# Patient Record
Sex: Female | Born: 1955 | Race: Black or African American | Hispanic: No | Marital: Married | Smoking: Never smoker
Health system: Southern US, Community
[De-identification: ages and names within clinical notes are randomized; demographics above are authoritative.]

## PROBLEM LIST (undated history)

## (undated) DIAGNOSIS — E785 Hyperlipidemia, unspecified: Secondary | ICD-10-CM

## (undated) DIAGNOSIS — Z803 Family history of malignant neoplasm of breast: Secondary | ICD-10-CM

## (undated) DIAGNOSIS — N950 Postmenopausal bleeding: Secondary | ICD-10-CM

## (undated) DIAGNOSIS — E119 Type 2 diabetes mellitus without complications: Secondary | ICD-10-CM

## (undated) DIAGNOSIS — C801 Malignant (primary) neoplasm, unspecified: Secondary | ICD-10-CM

## (undated) HISTORY — DX: Type 2 diabetes mellitus without complications: E11.9

## (undated) HISTORY — DX: Family history of malignant neoplasm of breast: Z80.3

## (undated) HISTORY — DX: Postmenopausal bleeding: N95.0

## (undated) HISTORY — PX: UTERINE FIBROID SURGERY: SHX826

---

## 2000-02-15 ENCOUNTER — Other Ambulatory Visit: Admission: RE | Admit: 2000-02-15 | Discharge: 2000-02-15 | Payer: Self-pay | Admitting: Internal Medicine

## 2009-06-10 ENCOUNTER — Encounter: Admission: RE | Admit: 2009-06-10 | Discharge: 2009-06-10 | Payer: Self-pay | Admitting: Family Medicine

## 2012-08-27 ENCOUNTER — Ambulatory Visit: Payer: Self-pay | Admitting: Emergency Medicine

## 2012-08-27 VITALS — BP 140/70 | HR 92 | Temp 98.4°F | Resp 16

## 2012-08-27 DIAGNOSIS — N950 Postmenopausal bleeding: Secondary | ICD-10-CM

## 2012-08-27 DIAGNOSIS — R300 Dysuria: Secondary | ICD-10-CM

## 2012-08-27 DIAGNOSIS — N3000 Acute cystitis without hematuria: Secondary | ICD-10-CM

## 2012-08-27 LAB — POCT URINALYSIS DIPSTICK
Bilirubin, UA: NEGATIVE
Glucose, UA: NEGATIVE
Ketones, UA: NEGATIVE
Spec Grav, UA: 1.02
Urobilinogen, UA: 0.2

## 2012-08-27 LAB — POCT WET PREP WITH KOH
Trichomonas, UA: NEGATIVE
Yeast Wet Prep HPF POC: NEGATIVE

## 2012-08-27 LAB — POCT UA - MICROSCOPIC ONLY: Mucus, UA: NEGATIVE

## 2012-08-27 MED ORDER — SULFAMETHOXAZOLE-TRIMETHOPRIM 800-160 MG PO TABS: 1.0000 | ORAL_TABLET | Freq: Two times a day (BID) | ORAL | Status: AC

## 2012-08-27 MED ORDER — PHENAZOPYRIDINE HCL 200 MG PO TABS: 200.0000 mg | ORAL_TABLET | Freq: Three times a day (TID) | ORAL | Status: AC | PRN

## 2012-08-27 NOTE — Progress Notes (Signed)
   Date:  08/27/2012   Name:  Makayla Buchanan   DOB:  [birth year]   MRN:  [id number] Gender: female Age: 56 y.o.  PCP:  No primary provider on file.    Chief Complaint: Urinary Tract Infection and Vaginitis   History of Present Illness:  Makayla Buchanan is a 56 y.o. pleasant patient who presents with the following:  Has painless post menopausal bleeding for the past several months.  Has no GYN care for "a long time"  At least several years.  Treating herself for a yeast infection with OTC cream without improvement; still has dysuria and itching.  No fever or chills.  No dyspareunia.  Frequency and urgency but no back or abdominal pain.  No GI symptoms.    There is no problem list on file for this patient.   No past medical history on file.  No past surgical history on file.  History  Substance Use Topics  . Smoking status: Never Smoker   . Smokeless tobacco: Not on file  . Alcohol Use: Not on file    No family history on file.  No Known Allergies  Medication list has been reviewed and updated.  Current Outpatient Prescriptions on File Prior to Visit  Medication Sig Dispense Refill  . insulin NPH-insulin regular (NOVOLIN 70/30) (70-30) 100 UNIT/ML injection Inject into the skin 1 day or 1 dose.        Review of Systems:  As per HPI, otherwise negative.    Physical Examination: Filed Vitals:   08/27/12 1035  BP: 140/70  Pulse: 92  Temp: 98.4 F (36.9 C)  Resp: 16   There were no vitals filed for this visit. There is no height or weight on file to calculate BMI. Ideal Body Weight:     GEN: WDWN, NAD, Non-toxic, Alert & Oriented x 3 HEENT: Atraumatic, Normocephalic.  Ears and Nose: No external deformity. EXTR: No clubbing/cyanosis/edema NEURO: Normal gait.  PSYCH: Normally interactive. Conversant. Not depressed or anxious appearing.  Calm demeanor.  Physical Examination: Pelvic - normal external genitalia, vulva, vagina, cervix, uterus and adnexa, Assessment and  Plan: PAP Painless postmenopausal bleeding No evidence for yeast infection GYN referal Cystitis Septra and pyridium Carmelina Dane, MD  Results for orders placed in visit on 08/27/12  POCT UA - MICROSCOPIC ONLY      Component Value Range   WBC, Ur, HPF, POC TNTC     RBC, urine, microscopic 2-4     Bacteria, U Microscopic 1+     Mucus, UA neg     Epithelial cells, urine per micros 2-4     Crystals, Ur, HPF, POC neg     Casts, Ur, LPF, POC neg     Yeast, UA neg    POCT URINALYSIS DIPSTICK      Component Value Range   Color, UA yellow     Clarity, UA cloudy     Glucose, UA neg     Bilirubin, UA neg     Ketones, UA neg     Spec Grav, UA 1.020     Blood, UA trace     pH, UA 5.5     Protein, UA 30     Urobilinogen, UA 0.2     Nitrite, UA neg     Leukocytes, UA large (3+)

## 2012-08-28 LAB — PAP IG (IMAGE GUIDED)

## 2013-02-21 ENCOUNTER — Other Ambulatory Visit: Payer: Self-pay | Admitting: Obstetrics & Gynecology

## 2013-02-21 DIAGNOSIS — C541 Malignant neoplasm of endometrium: Secondary | ICD-10-CM

## 2013-02-26 ENCOUNTER — Ambulatory Visit
Admission: RE | Admit: 2013-02-26 | Discharge: 2013-02-26 | Disposition: A | Discharge status: Home / Self Care | Payer: BC Managed Care – PPO | Source: Ambulatory Visit | Attending: Obstetrics & Gynecology | Admitting: Obstetrics & Gynecology

## 2013-02-26 DIAGNOSIS — C541 Malignant neoplasm of endometrium: Secondary | ICD-10-CM

## 2013-02-26 MED ORDER — IOHEXOL 300 MG/ML  SOLN
100.0000 mL | Freq: Once | INTRAMUSCULAR | Status: AC | PRN
  Administered 2013-02-26: 100 mL via INTRAVENOUS

## 2013-03-06 ENCOUNTER — Encounter: Payer: Self-pay | Admitting: Gynecologic Oncology

## 2013-03-07 ENCOUNTER — Other Ambulatory Visit: Payer: BC Managed Care – PPO | Admitting: Lab

## 2013-03-07 ENCOUNTER — Ambulatory Visit: Payer: BC Managed Care – PPO | Attending: Gynecologic Oncology | Admitting: Gynecologic Oncology

## 2013-03-07 ENCOUNTER — Encounter: Payer: Self-pay | Admitting: Gynecologic Oncology

## 2013-03-07 VITALS — BP 128/58 | HR 80 | Temp 98.3°F | Ht 64.0 in | Wt 166.5 lb

## 2013-03-07 DIAGNOSIS — E119 Type 2 diabetes mellitus without complications: Secondary | ICD-10-CM

## 2013-03-07 DIAGNOSIS — C541 Malignant neoplasm of endometrium: Secondary | ICD-10-CM

## 2013-03-07 DIAGNOSIS — C549 Malignant neoplasm of corpus uteri, unspecified: Secondary | ICD-10-CM | POA: Insufficient documentation

## 2013-03-07 LAB — BASIC METABOLIC PANEL (CC13)
Chloride: 109 mEq/L — ABNORMAL HIGH (ref 98–107)
Potassium: 3.5 mEq/L (ref 3.5–5.1)

## 2013-03-07 LAB — CBC WITH DIFFERENTIAL/PLATELET
Basophils Absolute: 0 10*3/uL (ref 0.0–0.1)
Eosinophils Absolute: 0 10*3/uL (ref 0.0–0.5)
HGB: 12.7 g/dL (ref 11.6–15.9)
LYMPH%: 33.5 % (ref 14.0–49.7)
MONO#: 0.5 10*3/uL (ref 0.1–0.9)
NEUT#: 3.2 10*3/uL (ref 1.5–6.5)
Platelets: 184 10*3/uL (ref 145–400)
RBC: 4.24 10*6/uL (ref 3.70–5.45)
RDW: 13.7 % (ref 11.2–14.5)
WBC: 5.6 10*3/uL (ref 3.9–10.3)

## 2013-03-07 LAB — HEMOGLOBIN A1C
Hgb A1c MFr Bld: 10.9 % — ABNORMAL HIGH (ref <5.7)
Mean Plasma Glucose: 266 mg/dL — ABNORMAL HIGH (ref <117)

## 2013-03-07 NOTE — Patient Instructions (Signed)
Cancer of the Uterus The uterus is part of a woman's reproductive system. It is the hollow, pear-shaped organ where a baby grows. The uterus is in the pelvis between the bladder and the rectum. The narrow, lower portion of the uterus is the cervix. The fallopian tubes extend from either side of the top of the uterus to the ovaries. The wall of the uterus has two layers of tissue. The inner layer, or lining, is the endometrium. The outer layer is muscle tissue called the myometrium. In women of childbearing age, the lining of the uterus grows and thickens each month to prepare for pregnancy. If a woman does not become pregnant, the thick, bloody lining flows out of the body through the vagina. This flow is called menstruation. TYPES OF UTERINE CANCER  The most common type of cancer of the uterus begins in the lining (endometrium). It is called endometrial cancer, uterine cancer, or cancer of the uterus. It is seen in 2% to 3% of women.  A different type of cancer, uterine sarcoma, develops in the muscle (myometrium). Cancer that begins in the cervix is also a different type of cancer.  Rarely, a noncancerous fibroid tumor of the uterus develops into a sarcoma. CAUSES  No one knows the exact causes of uterine cancer. But it is clear that this disease is not contagious. No one can "catch" cancer from another person. Women who get this disease are more likely than other women to have certain risk factors. A risk factor is something that increases a person's chance of developing the disease.  Most women who have known risk factors do not get uterine cancer. On the other hand, many who do get this disease have none of these factors. Doctors can seldom explain why one woman gets uterine cancer and another does not.  Studies have found the following risk factors:  Age. Cancer of the uterus occurs mostly in women over age 50.  Endometrial hyperplasia (enlarged endometrium). The risk of uterine cancer is  higher if a woman has endometrial hyperplasia.  Hormone replacement therapy (HRT). HRT is used to control the symptoms of menopause, to prevent osteoporosis (thinning of the bones), and to reduce the risk of heart disease or stroke. Women who still have their uterus, and use estrogen without progesterone, have an increased risk of uterine cancer. Long-term use and large doses of estrogen seem to increase this risk. Women who use a combination of estrogen and progesterone have a lower risk of uterine cancer than women who use estrogen alone. The progesterone protects the uterus from developing cancer.  Obesity and related conditions. The body stores and releases some of its estrogen in fatty tissue. That is why obese women are more likely than thin women to have higher levels of estrogen in their bodies. High levels of estrogen may be the reason that obese women have an increased risk of developing uterine cancer. The risk of this disease is also higher in women with diabetes or high blood pressure. These conditions occur in many obese women.  Tamoxifen. Women taking the drug tamoxifen to prevent or treat breast cancer have an increased risk of uterine cancer. This risk appears to be related to the estrogen-like effect of this drug on the uterus.  Race. White women are more likely than African-American women to get uterine cancer.  Colorectal cancer. Women who have had an inherited form of colorectal cancer have a higher risk of developing uterine cancer than other women.  Infertility.  Beginning menstrual   periods before age 12.  Having menstrual periods after age 52.  History of cancer of the ovary or intestine.  Family history of uterine cancer.  Having diabetes, high blood pressure, thyroid or gallbladder disease.  Long-term use of high does of birth control pills. Birth control pills today are low in hormone doses.  Radiation to the abdomen or pelvis.  Smoking. SYMPTOMS  Uterine  cancer usually occurs after menopause. But it may also occur around the time that menopause begins. Abnormal vaginal bleeding is the most common symptom of uterine cancer. Bleeding may start as a watery, blood-streaked flow that gradually contains more blood. Women should not assume that abnormal vaginal bleeding is part of menopause. A woman should see her caregiver if she has any of the following symptoms:  Unusual vaginal bleeding or discharge.  Difficult or painful urination.  Pain during intercourse.  Pain in the pelvic area.  Increased girth (growth) of the stomach.  Any vaginal bleeding after menopause.  Unexplained weight loss. These symptoms can be caused by cancer or other less serious conditions. Most often they are not cancer. But a thorough evaluation is needed to be certain. DIAGNOSIS  If a woman has symptoms that suggest uterine cancer, her caregiver may check her general health and may order blood and urine tests. The caregiver also may perform one or more of these exams or tests.  Blood and urine tests and chest x-rays. The woman also may have:  Other X-rays.  CT scans.  Ultrasound test.  Magnetic resonance imaging (MRI).  Sigmoidoscopy.  Colonoscopy.  Pelvic exam. A woman will have a pelvic exam to check the vagina, uterus, bladder, and rectum. The caregiver feels these organs for any lumps or changes in their shape or size. To see the upper part of the vagina and the cervix, the caregiver inserts an instrument called a speculum into the vagina.  Pap test. The caregiver collects cells from the cervix and upper vagina. A medical laboratory checks for abnormal cells. The Pap test is better for detecting cancer of the cervix. But cells from inside the uterus usually do not show up on a Pap test. It is not a reliable test for uterine cancer.  Transvaginal ultrasound. The medical caregiver inserts an instrument into the vagina. The instrument aims high-frequency  sound waves at the uterus. The pattern of the echoes they produce creates a picture. If the endometrium looks too thick, the caregiver can do a biopsy.  Biopsy. The medical caregiver removes a sample of tissue from the uterine lining. This usually can be done in the caregiver's office.  Dilatation and Curettage (D&C). In some cases, a woman may need to have a D&C. D&C is usually done as same-day surgery with anesthesia in a hospital. A pathologist examines the tissue (lining of the uterus) to check for cancer cells and other conditions. STAGING   If uterine cancer is diagnosed, the caregiver needs to know the stage, or extent, of the disease to plan the best treatment. Staging is a careful attempt to find out whether the cancer has spread, and if so, to what parts of the body.  When uterine cancer spreads (metastasizes) outside the uterus, cancer cells are often found in nearby lymph nodes, nerves, or blood vessels. If the cancer has reached the lymph nodes, cancer cells may have spread to other lymph nodes and other organs of the body.  Staging is done at the time of surgery. In most cases, the most reliable way to stage   this disease is to remove the uterus, cervix, tubes, ovaries, and lymph nodes. A pathologist uses a microscope to examine the uterus and other tissues removed by the surgeon, to determine the extent of the cancer in the pelvis.  If lymph nodes have cancer cells, other parts of the body are examined, to see if it has spread to other organs. MAIN FEATURES OF EACH STAGE OF THE DISEASE: Stage I. The cancer is only in the body of the uterus. It is not in the cervix. Stage II. The cancer has spread from the body of the uterus to the cervix. Stage III. The cancer has spread outside the uterus, but not outside the pelvis (and not to the bladder or rectum). Lymph nodes in the pelvis may contain cancer cells. Stage IV. The cancer has spread into the bladder or rectum. It may have spread  beyond the pelvis to other body parts. TREATMENT  Women with uterine cancer have many treatment options. Most women with uterine cancer are treated with surgery. Some have radiation or chemotherapy. A smaller number of women may be treated with hormonal therapy. Some patients receive a combination of therapies. You may want to consult with another cancer doctor for a second opinion. The caregiver (usually a cancer doctor) is the best person to describe your treatment choices and to discuss the expected results of treatment. SURGERY  Most women with uterine cancer have surgery to remove the uterus, cervix, tubes, and ovaries (total hysterectomy). This is usually done through an incision in the abdomen.  The doctor may also remove the lymph nodes near the tumor, to see if they contain cancer. If cancer cells have reached the lymph nodes, it may mean that the disease has spread to other parts of the body. If cancer cells have not spread beyond the endometrium, the woman may not need to have any other treatment. The length of the hospital stay may vary from several days to a week. RADIATION THERAPY  In radiation therapy, high-energy rays are used to kill cancer cells. Like surgery, radiation therapy is a local therapy. It affects cancer cells only in the treated area.  Some women with Stage I, II, or III uterine cancer need both radiation therapy and surgery. They may have radiation before surgery to shrink the tumor, or after surgery to destroy any cancer cells that remain in the area. The doctor may suggest radiation treatments for the small number of women who cannot have surgery.  Doctors use two types of radiation therapy to treat uterine cancer:  External radiation. In external radiation therapy, a large machine outside the body is used to aim radiation at the tumor area. The woman usually does not stay overnight (outpatient) at the hospital or clinic, and receives external radiation 5 days a week  for several weeks. This schedule helps protect healthy cells and tissue by spreading out the total dose of radiation. No radioactive materials are put into the body for external radiation therapy.  Internal radiation. In internal radiation therapy, tiny tubes containing a radioactive substance are inserted through the vagina and cervix, into the uterus, and left in place for a few days. The woman stays in the hospital during this treatment. To protect others from radiation exposure, the patient may not be able to have visitors or may have visitors only for a short period of time while the implant is in place. Once the implant is removed, the woman has no radioactivity in her body.  Some patients need both   external and internal radiation therapies. CHEMOTHERAPY Chemotherapy is not usually used for endometrial cancer of the uterus. However, with sarcoma of the uterus or of the fibroid, it may be used in combination with surgery. Chemotherapy may also be used with recurring sarcoma, and in patients who cannot have surgery. HORMONE THERAPY Hormonal therapy involves substances that prevent cancer cells from multiplying or growing by attaching to hormone receptors. This causes changes in cancer cells. Before therapy begins, the caregiver may request a hormone receptor test. This special lab test of uterine tissue helps the caregiver learn if estrogen and progesterone receptors are present. If the tissue has receptors, the woman is more likely to respond to hormonal therapy.  Hormonal therapy is called a systemic therapy, because it can affect cancer cells throughout the body. Usually, hormonal therapy is a type of progesterone, taken as a pill or injection.  The doctor may use hormonal therapy for women with uterine cancer who are unable to have surgery or radiation therapy. Also, the doctor may give hormonal therapy to women with uterine cancer that has spread to the lungs or other distant sites. It is also  given to women with uterine cancer that has come back.  Hormonal therapy can cause a number of side effects. Women taking progesterone may retain fluid, have an increased appetite, and gain weight. Women who are still menstruating may have changes in their periods.  Hormone therapy can be used in combination with surgery or radiation. HOME CARE INSTRUCTIONS   Maintain a normal weight with a healthy balanced diet and exercise.  If you have diabetes, high blood pressure, thyroid or gallbladder disease, keep them in control with your caregiver's treatment and recommendations.  Do not smoke.  Do not take estrogen without taking progesterone with it, for menopausal symptoms.  Join a support group or get counseling, if you would like help dealing with your cancer.  If you are on hormone replacement therapy, see your caregiver as recommended, and be informed about the side effects of HRT.  Women with known risk factors should ask their caregiver what symptoms to look for and how often they should have an examination.  Keep your follow-up appointments and take your medicines as advised.  Write your questions down, and take them with you to your caregiver's appointments.  You may want another person to be with you for your appointments, so you do not miss any instructions. SEEK MEDICAL CARE IF:   You have any abnormal vaginal bleeding.  You are having menstrual periods at the age of 52 or older.  You have bleeding after sexual intercourse.  You are taking tomoxifen and develop vaginal bleeding.  Your stomach is growing, and you are not pregnant.  You have pain with sexual intercourse.  You have stomach or pelvis pain.  You have weight loss for no known reason.  You have pain or difficulty with urination. [facility]  Cancer Information Service (CIS) provides accurate, up-to-date information on cancer to patients and their families, health professionals, and  the general public:  Phone: 1-800-4-CANCER (1-800-422-6237).  Internet: http://www.cancer.gov NCI's website contains complete information about cancer causes and prevention, screening and diagnosis, treatment and survivorship, clinical trials, statistics, funding, training, and employment opportunities, and the Institute and its programs. CLINICAL TRIALS A woman who is interested in being part of a clinical trial should talk with her caregiver. NCI's website (http://www.cancer.gov) provides general information about clinical trials. It also offers detailed information about specific ongoing studies of uterine   cancer by linking to PDQ, a cancer information database developed by the NCI. The Cancer Information Service at 1-800-4-CANCER can answer questions about cancer and provide information from the PDQ database. Document Released: 12/05/2005 Document Revised: 02/27/2012 Document Reviewed: 10/08/2009 Exit[employer] Patient Information 2013 [employer].  

## 2013-03-07 NOTE — Progress Notes (Signed)
Consult Note: Gyn-Onc  Makayla Buchanan 57 y.o. female  CC:  Chief Complaint  Patient presents with  . endometrial cancer    HPI: Patient is seen today in consultation at the request of Dr. Seymour Bars for a newly diagnosed endometrial cancer.  Patient is a 57 year old gravida 1 para 1 who began having some bleeding about 6 months ago. The bleeding has been like spotting with no significant cramping or pain. She does a history of having irregular menstrual cycles. She's been menopausal for essentially 5 years. She never took any hormone replacement therapy. Due to the persistent nature for spotting she went to urgent care and his was referred to Dr. Seymour Bars.    Dr. Seymour Bars performed an endometrial biopsy on February 26. 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 She also had a CT scan of the abdomen and pelvis the findings which are below.  Findings: The lung bases are clear. The liver enhances with no focal abnormality and no ductal dilatation is seen. No calcified gallstones are noted. The pancreas is normal in size although somewhat atrophic. The pancreatic duct is not dilated. The adrenal glands and spleen are unremarkable. The stomach is moderately fluid distended with no abnormality noted. The kidneys enhance with no calculus or mass and no hydronephrosis is seen. On delayed images, the pelvocaliceal systems are unremarkable. The proximal ureters are normal in caliber. The abdominal aorta appears normal. No adenopathy is noted. Small mesenteric nodes are present none of which are pathologically enlarged. There is low attenuation within the uterus in this patient with recently diagnosed endometrial carcinoma. No pelvic adenopathy is seen. The ovaries are within normal limits in size. No free fluid is seen within the pelvis. The urinary bladder is moderately distended with no abnormality noted. No abnormality of the colon is seen. The terminal ileum is unremarkable. The appendix is visualized  filling with air and extending cephalad to the left of midline, with no inflammatory process noted. There is a 4 mm anterolisthesis of L4 on L5 which appears to be due to degenerative change involving the facet joints.  IMPRESSION:  1. Abnormal appearance of the uterus in this patient with recently diagnosed endometrial carcinoma. No evidence of metastatic involvement of the abdomen or pelvis and no adenopathy is seen.  2. 4 mm anterolisthesis of L4 on L5 which appears to be due to degenerative change   Review of Systems She does not have a primary care physician. She is a diabetic intake 50 units of insulin a day. She does not check her sugars she does not know when her last A1c was. She believes her last visit with her primary care physician was 4 years ago. She stay she's not require prescription again her insulin in due to lack of insurance did not see any medical care. Her last mammogram was 3 years ago. She's never had a colonoscopy. She states that she occasionally has some cramping related to her bowels. She hasn't cramping today and feel that it was due to sedation by the last night. She denies a change in her bowel or bladder habits. She denies any prior blood per rectum. She denies any change in her stool caliber. Her weight has been stable. History is quite tired. She works as a Technical brewer. She denies any neuropathy related to her diabetes.  She currently has medical insurance from the affordable care act.  Current Meds:  Outpatient Encounter Prescriptions as of 03/07/2013  Medication Sig Dispense Refill  . insulin NPH-insulin regular (  NOVOLIN 70/30) (70-30) 100 UNIT/ML injection Inject into the skin 1 day or 1 dose.       No facility-administered encounter medications on file as of 03/07/2013.    Allergy: No Known Allergies  Social Hx:   History   Social History  . Marital Status: Single    Spouse Name: N/A    Number of Children: N/A  . Years of Education: N/A    Occupational History  . Not on file.   Social History Main Topics  . Smoking status: Never Smoker   . Smokeless tobacco: Not on file  . Alcohol Use: No  . Drug Use: No  . Sexually Active: Not on file   Other Topics Concern  . Not on file   Social History Narrative  . No narrative on file    Past Surgical Hx: History reviewed. No pertinent past surgical history.  Past Medical Hx:  Past Medical History  Diagnosis Date  . Postmenopausal bleeding   . Diabetes mellitus without complication     Family Hx:  Family History  Problem Relation Age of Onset  . Diabetes Mother   . Hypertension Mother   . Breast cancer Mother   . Diabetes Father     Vitals:  Blood pressure 128/58, pulse 80, temperature 98.3 F (36.8 C), temperature source Oral, height 5\' 4"  (1.626 m), weight 166 lb 8 oz (75.524 kg).  Physical Exam:  Well-nourished well-developed female in no acute distress.  Neck: Supple, no lymphadenopathy no thyromegaly.  Lungs: Clear to auscultation bilaterally.  Cardiac Astro: Regular rate rhythm.  Abdomen: Soft, nontender, nondistended. Mildly obese. There is no palpable masses or hepatosplenomegaly.  Groins: No lymphadenopathy.  Extremities: No edema.  Pelvic: Normal external female genitalia. Vagina is atrophic. The cervix is multiparous. There are no lesions. There is no discharge. Bimanual examination reveals no masses or nodularity. The corpus is not enlarged.  Assessment/Plan: 56 year old with a clinical stage I grade 1 endometrial carcinoma that CT scan also that did not reveal any evidence of metastatic disease. I do believe that she would be a candidate for a minimally invasive surgery. We discussed proceeding with a total robotic hysterectomy with bilateral salpingo-oophorectomy. She understands that the uterus we sent for frozen section. We will start the pelvic lymphadenectomy while the uterus is a frozen section and pending the results of the frozen  section, we will either complete the pelvic lymphadenectomy and proceed with para-aortic lymphadenectomy or will conclude the procedure.  Risks of surgery including but not limited to bleeding infection injury to surrounding organs thromboembolic disease the need for laparotomy were discussed with the patient she wishes to proceed. We will check CBC with differential chemistries an A1c today to evaluate where we are in terms of her diabetes and other medical issues. Pending the results from that she'll be scheduled for surgery accordingly.  She is tentatively scheduled for surgery on 03/26/2013 with Dr. Nelly Rout. She the opportunity to meet with Dr. Nelly Rout prior to surgery.  Her questions as well as those of her friend who accompanied her today were elicited in answer to their satisfaction.  Grayce Budden A., MD 03/07/2013, 10:37 AM

## 2013-03-18 ENCOUNTER — Encounter (HOSPITAL_COMMUNITY): Payer: Self-pay | Admitting: Pharmacy Technician

## 2013-03-22 ENCOUNTER — Encounter (HOSPITAL_COMMUNITY): Payer: Self-pay

## 2013-03-22 ENCOUNTER — Encounter (HOSPITAL_COMMUNITY)
Admission: RE | Admit: 2013-03-22 | Discharge: 2013-03-22 | Disposition: A | Discharge status: Home / Self Care | Payer: BC Managed Care – PPO | Source: Ambulatory Visit | Attending: Gynecologic Oncology | Admitting: Gynecologic Oncology

## 2013-03-22 ENCOUNTER — Ambulatory Visit (HOSPITAL_COMMUNITY)
Admission: RE | Admit: 2013-03-22 | Discharge: 2013-03-22 | Disposition: A | Discharge status: Home / Self Care | Payer: BC Managed Care – PPO | Source: Ambulatory Visit | Attending: Gynecologic Oncology | Admitting: Gynecologic Oncology

## 2013-03-22 DIAGNOSIS — Z01810 Encounter for preprocedural cardiovascular examination: Secondary | ICD-10-CM | POA: Insufficient documentation

## 2013-03-22 DIAGNOSIS — Z01818 Encounter for other preprocedural examination: Secondary | ICD-10-CM | POA: Insufficient documentation

## 2013-03-22 DIAGNOSIS — Z01812 Encounter for preprocedural laboratory examination: Secondary | ICD-10-CM | POA: Insufficient documentation

## 2013-03-22 DIAGNOSIS — C549 Malignant neoplasm of corpus uteri, unspecified: Secondary | ICD-10-CM | POA: Insufficient documentation

## 2013-03-22 HISTORY — DX: Malignant (primary) neoplasm, unspecified: C80.1

## 2013-03-22 HISTORY — DX: Hyperlipidemia, unspecified: E78.5

## 2013-03-22 LAB — COMPREHENSIVE METABOLIC PANEL
AST: 20 U/L (ref 0–37)
BUN: 13 mg/dL (ref 6–23)
CO2: 28 mEq/L (ref 19–32)
Chloride: 106 mEq/L (ref 96–112)
Creatinine, Ser: 0.61 mg/dL (ref 0.50–1.10)
GFR calc non Af Amer: >90 mL/min (ref >90)
Total Bilirubin: 0.3 mg/dL (ref 0.3–1.2)

## 2013-03-22 LAB — CBC WITH DIFFERENTIAL/PLATELET
HCT: 38.3 % (ref 36.0–46.0)
Hemoglobin: 12.3 g/dL (ref 12.0–15.0)
Lymphocytes Relative: 36 % (ref 12–46)
Lymphs Abs: 2.2 10*3/uL (ref 0.7–4.0)
Monocytes Absolute: 0.4 10*3/uL (ref 0.1–1.0)
Monocytes Relative: 6 % (ref 3–12)
Neutro Abs: 3.5 10*3/uL (ref 1.7–7.7)
WBC: 6.1 10*3/uL (ref 4.0–10.5)

## 2013-03-22 LAB — HEMOGLOBIN A1C: Mean Plasma Glucose: 258 mg/dL — ABNORMAL HIGH (ref <117)

## 2013-03-22 LAB — ABO/RH: ABO/RH(D): B POS

## 2013-03-22 LAB — SURGICAL PCR SCREEN
MRSA, PCR: NEGATIVE
Staphylococcus aureus: NEGATIVE

## 2013-03-22 NOTE — Patient Instructions (Signed)
Makayla Buchanan  03/22/2013                           YOUR PROCEDURE IS SCHEDULED ON:  03/26/13               PLEASE REPORT TO SHORT STAY CENTER AT : 5:15 AM               CALL THIS NUMBER IF ANY PROBLEMS THE DAY OF SURGERY :               [phone]                      REMEMBER:   Do not eat food or drink liquids AFTER MIDNIGHT   Take these medicines the morning of surgery with A SIP OF WATER: NONE   Do not wear jewelry, make-up   Do not wear lotions, powders, or perfumes.   Do not shave legs or underarms 12 hrs. before surgery (men may shave face)  Do not bring valuables to the hospital.  Contacts, dentures or bridgework may not be worn into surgery.  Leave suitcase in the car. After surgery it may be brought to your room.  For patients admitted to the hospital more than one night, checkout time is 11:00                          The day of discharge.   Patients discharged the day of surgery will not be allowed to drive home                             If going home same day of surgery, must have someone stay with you first                           24 hrs at home and arrange for some one to drive you home from hospital.    Special Instructions:   Please read over the following fact sheets that you were given:               1. MRSA  INFORMATION                      2. [place] PREPARING FOR SURGERY SHEET               3. INCENTIVE SPIROMETER               4. CLEAR LIQUIDS ONLY FOR 24 HRS BEFORE SURGERY                                                X_____________________________________________________________________        Failure to follow these instructions may result in cancellation of your surgery

## 2013-03-26 ENCOUNTER — Ambulatory Visit (HOSPITAL_COMMUNITY): Payer: BC Managed Care – PPO | Admitting: *Deleted

## 2013-03-26 ENCOUNTER — Other Ambulatory Visit: Payer: Self-pay | Admitting: *Deleted

## 2013-03-26 ENCOUNTER — Encounter (HOSPITAL_COMMUNITY): Payer: Self-pay | Admitting: *Deleted

## 2013-03-26 ENCOUNTER — Encounter (HOSPITAL_COMMUNITY)
Admission: RE | Disposition: A | Discharge status: Home / Self Care | Payer: Self-pay | Source: Ambulatory Visit | Attending: Gynecologic Oncology

## 2013-03-26 ENCOUNTER — Ambulatory Visit (HOSPITAL_COMMUNITY)
Admission: RE | Admit: 2013-03-26 | Discharge: 2013-03-26 | Disposition: A | Discharge status: Home / Self Care | Payer: BC Managed Care – PPO | Source: Ambulatory Visit | Attending: Gynecologic Oncology | Admitting: Gynecologic Oncology

## 2013-03-26 DIAGNOSIS — C541 Malignant neoplasm of endometrium: Secondary | ICD-10-CM

## 2013-03-26 DIAGNOSIS — E1165 Type 2 diabetes mellitus with hyperglycemia: Secondary | ICD-10-CM | POA: Insufficient documentation

## 2013-03-26 DIAGNOSIS — E118 Type 2 diabetes mellitus with unspecified complications: Secondary | ICD-10-CM | POA: Insufficient documentation

## 2013-03-26 DIAGNOSIS — IMO0002 Reserved for concepts with insufficient information to code with codable children: Secondary | ICD-10-CM | POA: Insufficient documentation

## 2013-03-26 DIAGNOSIS — Q762 Congenital spondylolisthesis: Secondary | ICD-10-CM | POA: Insufficient documentation

## 2013-03-26 DIAGNOSIS — C549 Malignant neoplasm of corpus uteri, unspecified: Secondary | ICD-10-CM | POA: Insufficient documentation

## 2013-03-26 DIAGNOSIS — Z794 Long term (current) use of insulin: Secondary | ICD-10-CM | POA: Insufficient documentation

## 2013-03-26 HISTORY — PX: DILATION AND CURETTAGE OF UTERUS: SHX78

## 2013-03-26 LAB — GLUCOSE, CAPILLARY
Glucose-Capillary: 259 mg/dL — ABNORMAL HIGH (ref 70–99)
Glucose-Capillary: 188 mg/dL — ABNORMAL HIGH (ref 70–99)
Glucose-Capillary: 137 mg/dL — ABNORMAL HIGH (ref 70–99)

## 2013-03-26 LAB — TYPE AND SCREEN
ABO/RH(D): B POS
Antibody Screen: NEGATIVE

## 2013-03-26 SURGERY — DILATION AND CURETTAGE
Anesthesia: General | Wound class: Clean Contaminated

## 2013-03-26 MED ORDER — MIDAZOLAM HCL 5 MG/5ML IJ SOLN
INTRAMUSCULAR | Status: DC | PRN
  Administered 2013-03-26: 2 mg via INTRAVENOUS

## 2013-03-26 MED ORDER — MEPERIDINE HCL 50 MG/ML IJ SOLN: 6.2500 mg | INTRAMUSCULAR | Status: DC | PRN

## 2013-03-26 MED ORDER — LIDOCAINE HCL (CARDIAC) 20 MG/ML IV SOLN
INTRAVENOUS | Status: DC | PRN
  Administered 2013-03-26: 50 mg via INTRAVENOUS

## 2013-03-26 MED ORDER — ENOXAPARIN SODIUM 40 MG/0.4ML ~~LOC~~ SOLN
40.0000 mg | SUBCUTANEOUS | Status: AC
  Administered 2013-03-26: 40 mg via SUBCUTANEOUS
  Filled 2013-03-26: qty 0.4

## 2013-03-26 MED ORDER — LIDOCAINE-EPINEPHRINE 1 %-1:100000 IJ SOLN
INTRAMUSCULAR | Status: AC
  Filled 2013-03-26: qty 1

## 2013-03-26 MED ORDER — LACTATED RINGERS IV SOLN: INTRAVENOUS | Status: DC

## 2013-03-26 MED ORDER — INSULIN ASPART 100 UNIT/ML ~~LOC~~ SOLN
0.0000 [IU] | Freq: Three times a day (TID) | SUBCUTANEOUS | Status: DC
  Administered 2013-03-26: 3 [IU] via SUBCUTANEOUS

## 2013-03-26 MED ORDER — INSULIN ASPART 100 UNIT/ML ~~LOC~~ SOLN
5.0000 [IU] | Freq: Once | SUBCUTANEOUS | Status: AC
  Administered 2013-03-26: 5 [IU] via SUBCUTANEOUS
  Filled 2013-03-26: qty 1

## 2013-03-26 MED ORDER — PROPOFOL 10 MG/ML IV BOLUS
INTRAVENOUS | Status: DC | PRN
  Administered 2013-03-26: 200 mg via INTRAVENOUS

## 2013-03-26 MED ORDER — LACTATED RINGERS IV SOLN
INTRAVENOUS | Status: DC | PRN
  Administered 2013-03-26 (×2): via INTRAVENOUS

## 2013-03-26 MED ORDER — INSULIN ASPART 100 UNIT/ML ~~LOC~~ SOLN
SUBCUTANEOUS | Status: AC
  Filled 2013-03-26: qty 1

## 2013-03-26 MED ORDER — CEFAZOLIN SODIUM-DEXTROSE 2-3 GM-% IV SOLR
2.0000 g | INTRAVENOUS | Status: AC
  Administered 2013-03-26: 2 g via INTRAVENOUS

## 2013-03-26 MED ORDER — HYDROCODONE-ACETAMINOPHEN 10-500 MG PO TABS: 1.0000 | ORAL_TABLET | Freq: Four times a day (QID) | ORAL | Status: DC | PRN

## 2013-03-26 MED ORDER — FENTANYL CITRATE 0.05 MG/ML IJ SOLN: 25.0000 ug | INTRAMUSCULAR | Status: DC | PRN

## 2013-03-26 MED ORDER — ONDANSETRON HCL 4 MG/2ML IJ SOLN
INTRAMUSCULAR | Status: DC | PRN
  Administered 2013-03-26: 4 mg via INTRAVENOUS

## 2013-03-26 MED ORDER — FENTANYL CITRATE 0.05 MG/ML IJ SOLN
INTRAMUSCULAR | Status: DC | PRN
  Administered 2013-03-26: 100 ug via INTRAVENOUS

## 2013-03-26 MED ORDER — PROMETHAZINE HCL 25 MG/ML IJ SOLN: 6.2500 mg | INTRAMUSCULAR | Status: DC | PRN

## 2013-03-26 MED ORDER — 0.9 % SODIUM CHLORIDE (POUR BTL) OPTIME
TOPICAL | Status: DC | PRN
  Administered 2013-03-26: 1000 mL

## 2013-03-26 MED ORDER — CEFAZOLIN SODIUM-DEXTROSE 2-3 GM-% IV SOLR
INTRAVENOUS | Status: AC
  Filled 2013-03-26: qty 50

## 2013-03-26 SURGICAL SUPPLY — 12 items
CATH ROBINSON RED A/P 16FR (CATHETERS) ×3 IMPLANT
CLOTH BEACON ORANGE TIMEOUT ST (SAFETY) ×2 IMPLANT
DRESSING TELFA 8X3 (GAUZE/BANDAGES/DRESSINGS) ×2 IMPLANT
GLOVE BIO SURGEON STRL SZ7.5 (GLOVE) ×3 IMPLANT
GLOVE INDICATOR 8.0 STRL GRN (GLOVE) ×2 IMPLANT
Mirena IUD ×1 IMPLANT
NDL SPNL 22GX3.5 QUINCKE BK (NEEDLE) ×1 IMPLANT
NEEDLE SPNL 22GX3.5 QUINCKE BK (NEEDLE) IMPLANT
PACK MINOR VAGINAL W LONG (CUSTOM PROCEDURE TRAY) ×2 IMPLANT
SYR CONTROL 10ML LL (SYRINGE) ×1 IMPLANT
UNDERPAD 30X30 INCONTINENT (UNDERPADS AND DIAPERS) ×2 IMPLANT
WATER STERILE IRR 1500ML POUR (IV SOLUTION) ×1 IMPLANT

## 2013-03-26 NOTE — Transfer of Care (Signed)
Immediate Anesthesia Transfer of Care Note  Patient: Makayla Buchanan  Procedure(s) Performed: Procedure(s) with comments: DILATATION AND CURETTAGE with insertion of Mirena IUD (N/A) - to start at 0745 per nancy. placement of Mirena.  Patient Location: PACU  Anesthesia Type:General  Level of Consciousness: alert , oriented and patient cooperative  Airway & Oxygen Therapy: Patient Spontanous Breathing and Patient connected to face mask oxygen  Post-op Assessment: Report given to PACU RN, Post -op Vital signs reviewed and stable and Patient moving all extremities  Post vital signs: Reviewed and stable  Complications: No apparent anesthesia complications

## 2013-03-26 NOTE — Op Note (Signed)
Preoperative Diagnosis: Grade 1 endometrial cancer Uncontrolled type 2 diabetes mellitus  Postoperative Diagnosis: Grade 1 endometrial cancer, Uncontrolled type 2 diabetes mellitus  Procedure(s) Performed: 1. Uterine dilatation and curettage 2. Placement of Mirena IUD  Surgeon: [name].  Nelly Rout, M.D. PhD  Anesthesia: LMA  Specimens: Uterine curettings  Estimated Blood Loss: Minimal mL. Blood Replacement: None  Urine Output: 200 cc  Indication for Procedure: This is a 57 year old with a diagnosis of a grade 1 endometrial adenocarcinoma. At time of presentation she reported use of insulin with no identifiable primary care practitioner. A hemoglobin A1c was obtained returned to value of 10.6. The patient was counseled that a major surgical staging procedure was not advisable given her uncontrolled diabetes. The plan is the presence of a Mirena IUD until there is better control of her glucose  Operative Findings: Mobile uterus. Uterus sounded to 8 cm. There was no evidence of metastatic disease within the cul-de-sac or the vagina  Procedure: Patient taken to the operating room and timeout was performed according to hospital procedure. She is placed and anesthesia and then placed in dorsolithotomy position and prepped and draped in usual sterile fashion. A tenaculum was placed on the anterior cervical lip and the uterus was sounded to 8 cm. The cervix was dilated and uterine curettage was performed without difficulty. The Mirena IUD was placed within the endometrial cavity and the suture cut approximately 2 cm. Tenaculum was removed and the vagina cleaned of the uterine curettings.  Sponge, lap and needle counts were correct x 3.    Complications: None  The patient had sequential compression devices for VTE prophylaxis.         Disposition: PACU - hemodynamically stable.         Condition: stable

## 2013-03-26 NOTE — Progress Notes (Signed)
Spoke to dr brewester, procedure will change from what is on OR consent.  She will obtain new consent in [place]

## 2013-03-26 NOTE — Interval H&P Note (Signed)
History and Physical Interval Note:  03/26/2013 7:32 AM  Makayla Buchanan  has presented today for surgery, with the diagnosis of endometrial cancer   The various methods of treatment have been discussed with the patient and family. After consideration of risks, benefits and other options for treatment, the patient has consented to  Procedure(s) with comments: DILATATION AND CURETTAGE (N/A) - to start at 0745 per nancy. placement of Mirena. as a surgical intervention .  The patient's history has been reviewed, patient examined, no change in status, stable for surgery.  I have reviewed the patient's chart and labs.  Questions were answered to the patient's satisfaction.     [place], [facility]

## 2013-03-26 NOTE — H&P (View-Only) (Signed)
Consult Note: Gyn-Onc  Makayla Buchanan 57 y.o. female  CC:  Chief Complaint  Patient presents with  . endometrial cancer    HPI: Patient is seen today in consultation at the request of Dr. Lavoie for a newly diagnosed endometrial cancer.  Patient is a 57-year-old gravida 1 para 1 who began having some bleeding about 6 months ago. The bleeding has been like spotting with no significant cramping or pain. She does a history of having irregular menstrual cycles. She's been menopausal for essentially 5 years. She never took any hormone replacement therapy. Due to the persistent nature for spotting she went to urgent care and his was referred to Dr. Lavoie.    Dr. Lavoie performed an endometrial biopsy on February 26. 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 She also had a CT scan of the abdomen and pelvis the findings which are below.  Findings: The lung bases are clear. The liver enhances with no focal abnormality and no ductal dilatation is seen. No calcified gallstones are noted. The pancreas is normal in size although somewhat atrophic. The pancreatic duct is not dilated. The adrenal glands and spleen are unremarkable. The stomach is moderately fluid distended with no abnormality noted. The kidneys enhance with no calculus or mass and no hydronephrosis is seen. On delayed images, the pelvocaliceal systems are unremarkable. The proximal ureters are normal in caliber. The abdominal aorta appears normal. No adenopathy is noted. Small mesenteric nodes are present none of which are pathologically enlarged. There is low attenuation within the uterus in this patient with recently diagnosed endometrial carcinoma. No pelvic adenopathy is seen. The ovaries are within normal limits in size. No free fluid is seen within the pelvis. The urinary bladder is moderately distended with no abnormality noted. No abnormality of the colon is seen. The terminal ileum is unremarkable. The appendix is visualized  filling with air and extending cephalad to the left of midline, with no inflammatory process noted. There is a 4 mm anterolisthesis of L4 on L5 which appears to be due to degenerative change involving the facet joints.  IMPRESSION:  1. Abnormal appearance of the uterus in this patient with recently diagnosed endometrial carcinoma. No evidence of metastatic involvement of the abdomen or pelvis and no adenopathy is seen.  2. 4 mm anterolisthesis of L4 on L5 which appears to be due to degenerative change   Review of Systems She does not have a primary care physician. She is a diabetic intake 50 units of insulin a day. She does not check her sugars she does not know when her last A1c was. She believes her last visit with her primary care physician was 4 years ago. She stay she's not require prescription again her insulin in due to lack of insurance did not see any medical care. Her last mammogram was 3 years ago. She's never had a colonoscopy. She states that she occasionally has some cramping related to her bowels. She hasn't cramping today and feel that it was due to sedation by the last night. She denies a change in her bowel or bladder habits. She denies any prior blood per rectum. She denies any change in her stool caliber. Her weight has been stable. History is quite tired. 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 She denies any neuropathy related to her diabetes.  She currently has medical insurance from the affordable care act.  Current Meds:  Outpatient Encounter Prescriptions as of 03/07/2013  Medication Sig Dispense Refill  . insulin NPH-insulin regular (  NOVOLIN 70/30) (70-30) 100 UNIT/ML injection Inject into the skin 1 day or 1 dose.       No facility-administered encounter medications on file as of 03/07/2013.    Allergy: No Known Allergies  Social Hx:   History   Social History  . Marital Status: Single    Spouse Name: N/A    Number of Children: N/A  . Years of Education: N/A    Occupational History  . Not on file.   Social History Main Topics  . Smoking status: Never Smoker   . Smokeless tobacco: Not on file  . Alcohol Use: No  . Drug Use: No  . Sexually Active: Not on file   Other Topics Concern  . Not on file   Social History Narrative  . No narrative on file    Past Surgical Hx: History reviewed. No pertinent past surgical history.  Past Medical Hx:  Past Medical History  Diagnosis Date  . Postmenopausal bleeding   . Diabetes mellitus without complication     Family Hx:  Family History  Problem Relation Age of Onset  . Diabetes Mother   . Hypertension Mother   . Breast cancer Mother   . Diabetes Father     Vitals:  Blood pressure 128/58, pulse 80, temperature 98.3 F (36.8 C), temperature source Oral, height 5' 4" (1.626 m), weight 166 lb 8 oz (75.524 kg).  Physical Exam:  Well-nourished well-developed female in no acute distress.  Neck: Supple, no lymphadenopathy no thyromegaly.  Lungs: Clear to auscultation bilaterally.  Cardiac Astro: Regular rate rhythm.  Abdomen: Soft, nontender, nondistended. Mildly obese. There is no palpable masses or hepatosplenomegaly.  Groins: No lymphadenopathy.  Extremities: No edema.  Pelvic: Normal external female genitalia. Vagina is atrophic. The cervix is multiparous. There are no lesions. There is no discharge. Bimanual examination reveals no masses or nodularity. The corpus is not enlarged.  Assessment/Plan: 57-year-old with a clinical stage I grade 1 endometrial carcinoma that CT scan also that did not reveal any evidence of metastatic disease. I do believe that she would be a candidate for a minimally invasive surgery. We discussed proceeding with a total robotic hysterectomy with bilateral salpingo-oophorectomy. She understands that the uterus we sent for frozen section. We will start the pelvic lymphadenectomy while the uterus is a frozen section and pending the results of the frozen  section, we will either complete the pelvic lymphadenectomy and proceed with para-aortic lymphadenectomy or will conclude the procedure.  Risks of surgery including but not limited to bleeding infection injury to surrounding organs thromboembolic disease the need for laparotomy were discussed with the patient she wishes to proceed. We will check CBC with differential chemistries an A1c today to evaluate where we are in terms of her diabetes and other medical issues. Pending the results from that she'll be scheduled for surgery accordingly.  She is tentatively scheduled for surgery on 03/26/2013 with Dr. Brewster. She the opportunity to meet with Dr. Brewster prior to surgery.  Her questions as well as those of her friend who accompanied her today were elicited in answer to their satisfaction.  Makayla Buchanan A., MD 03/07/2013, 10:37 AM  

## 2013-03-26 NOTE — Anesthesia Preprocedure Evaluation (Addendum)
Anesthesia Evaluation  Patient identified by MRN, date of birth, ID band Patient awake    Reviewed: Allergy & Precautions, H&P , NPO status , Patient's Chart, lab work & pertinent test results  Airway Mallampati: II TM Distance: >3 FB Neck ROM: Full    Dental no notable dental hx.    Pulmonary neg pulmonary ROS,  breath sounds clear to auscultation  Pulmonary exam normal       Cardiovascular negative cardio ROS  Rhythm:Regular Rate:Normal     Neuro/Psych negative neurological ROS  negative psych ROS   GI/Hepatic negative GI ROS, Neg liver ROS,   Endo/Other  diabetes, Insulin Dependent  Renal/GU negative Renal ROS  negative genitourinary   Musculoskeletal negative musculoskeletal ROS (+)   Abdominal   Peds negative pediatric ROS (+)  Hematology negative hematology ROS (+)   Anesthesia Other Findings   Reproductive/Obstetrics negative OB ROS                          Anesthesia Physical Anesthesia Plan  ASA: II  Anesthesia Plan: General   Post-op Pain Management:    Induction: Intravenous  Airway Management Planned: LMA  Additional Equipment:   Intra-op Plan:   Post-operative Plan:   Informed Consent: I have reviewed the patients History and Physical, chart, labs and discussed the procedure including the risks, benefits and alternatives for the proposed anesthesia with the patient or authorized representative who has indicated his/her understanding and acceptance.   Dental advisory given  Plan Discussed with: CRNA  Anesthesia Plan Comments:         Anesthesia Quick Evaluation

## 2013-03-26 NOTE — Procedures (Signed)
error 

## 2013-03-26 NOTE — Anesthesia Postprocedure Evaluation (Signed)
  Anesthesia Post-op Note  Patient: Makayla Buchanan  Procedure(s) Performed: Procedure(s) (LRB): DILATATION AND CURETTAGE with insertion of Mirena IUD (N/A)  Patient Location: PACU  Anesthesia Type: General  Level of Consciousness: awake and alert   Airway and Oxygen Therapy: Patient Spontanous Breathing  Post-op Pain: mild  Post-op Assessment: Post-op Vital signs reviewed, Patient's Cardiovascular Status Stable, Respiratory Function Stable, Patent Airway and No signs of Nausea or vomiting  Last Vitals:  Filed Vitals:   03/26/13 0924  BP: 129/61  Pulse: 70  Temp: 36.4 C  Resp: 14    Post-op Vital Signs: stable   Complications: No apparent anesthesia complications

## 2013-03-28 ENCOUNTER — Encounter (HOSPITAL_COMMUNITY): Payer: Self-pay | Admitting: Gynecologic Oncology

## 2013-03-29 ENCOUNTER — Telehealth: Payer: Self-pay | Admitting: Gynecologic Oncology

## 2013-03-29 ENCOUNTER — Encounter: Payer: Self-pay | Admitting: Gynecologic Oncology

## 2013-03-29 NOTE — Telephone Encounter (Signed)
Patient informed of final pathology results.  Instructed that the forms she dropped off, Cancer Claim forms, would be faxed today.  She is requesting a return to work note for April 21.  No other concerns voiced.  Instructed to call for any needs.

## 2013-06-10 ENCOUNTER — Telehealth: Payer: Self-pay | Admitting: Gynecologic Oncology

## 2013-06-10 NOTE — Telephone Encounter (Signed)
Office Visit:  GYN ONCOLOGY  CC:  Chief Complaint   Patient presents with   .  endometrial cancer    Assessment/Plan:  57 y.o. with a clinical stage I grade 1 endometrial carcinoma that CT scan also that did not reveal any evidence of metastatic disease.Mirena IUD in place until better glucose management is obtained.     HPI:  Patient is seen today in consultation at the request of Dr. Seymour Bars for a newly diagnosed endometrial cancer.  Patient is a 57 year old gravida 1 para 1 who began having some bleeding about 6 months ago. The bleeding has been like spotting with no significant cramping or pain. She does a history of having irregular menstrual cycles. She's been menopausal for essentially 5 years. She never took any hormone replacement therapy. Due to the persistent nature for spotting she went to urgent care and his was referred to Dr. Seymour Bars.  Dr. Seymour Bars performed an endometrial biopsy on February 26. 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 She also had a CT scan of the abdomen and pelvis the findings which are below.  Findings: The lung bases are clear. The liver enhances with no focal abnormality and no ductal dilatation is seen. No calcified gallstones are noted. The pancreas is normal in size although somewhat atrophic. The pancreatic duct is not dilated. The adrenal glands and spleen are unremarkable. The stomach is moderately fluid distended with no abnormality noted. The kidneys enhance with no calculus or mass and no hydronephrosis is seen. On delayed images, the pelvocaliceal systems are unremarkable. The proximal ureters are normal in caliber. The abdominal aorta appears normal. No adenopathy is noted. Small mesenteric nodes are present none of which are pathologically enlarged. There is low attenuation within the uterus in this patient with recently diagnosed endometrial carcinoma. No pelvic adenopathy is seen. The ovaries are within normal limits in size. No free fluid is  seen within the pelvis. The urinary bladder is moderately distended with no abnormality noted. No abnormality of the colon is seen. The terminal ileum is unremarkable. The appendix is visualized filling with air and extending cephalad to the left of midline, with no inflammatory process noted. There is a 4 mm anterolisthesis of L4 on L5 which appears to be due to degenerative change involving the facet joints.  IMPRESSION:  1. Abnormal appearance of the uterus in this patient with recently diagnosed endometrial carcinoma. No evidence of metastatic involvement of the abdomen or pelvis and no adenopathy is seen.  2. 4 mm anterolisthesis of L4 on L5 which appears to be due to degenerative change   RTH BSO LND was planned for 03/2013.  HgbA1C returned 10.6.  A Mirena IUD was placed and th  patinet was advised to secure better endocrinologic management of her IDDM.    Review of Systems  She does not have a primary care physician. She is a diabetic intake 50 units of insulin a day. She does not check her sugars she does not know when her last A1c was. She believes her last visit with her primary care physician was 4 years ago. She stay she's not require prescription again her insulin in due to lack of insurance did not see any medical care. Her last mammogram was 3 years ago. She's never had a colonoscopy. She states that she occasionally has some cramping related to her bowels. She hasn't cramping today and feel that it was due to sedation by the last night. She denies a change in her bowel or  bladder habits. She denies any prior blood per rectum. She denies any change in her stool caliber. Her weight has been stable. History is quite tired. She works as a Technical brewer. She denies any neuropathy related to her diabetes. She currently has medical insurance from the affordable care act.   Current Meds:  Outpatient Encounter Prescriptions as of 03/07/2013   Medication  Sig  Dispense  Refill   .  insulin  NPH-insulin regular (NOVOLIN 70/30) (70-30) 100 UNIT/ML injection  Inject into the skin 1 day or 1 dose.      No facility-administered encounter medications on file as of 03/07/2013.      Social Hx:  History    Social History   .  Marital Status:  Single     Spouse Name:  N/A     Number of Children:  N/A   .  Years of Education:  N/A    Occupational History   .  Not on file.    Social History Main Topics   .  Smoking status:  Never Smoker   .  Smokeless tobacco:  Not on file   .  Alcohol Use:  No   .  Drug Use:  No   .  Sexually Active:  Not on file    Other Topics  Concern   .  Not on file    Social History Narrative   .  No narrative on file   Past Surgical Hx: History reviewed. No pertinent past surgical history.   Past Medical Hx:  Past Medical History   Diagnosis  Date   .  Postmenopausal bleeding    .  Diabetes mellitus without complication     Family Hx:  Family History   Problem  Relation  Age of Onset   .  Diabetes  Mother    .  Hypertension  Mother    .  Breast cancer  Mother    .  Diabetes  Father    Vitals:  Physical Exam:  Well-nourished well-developed female in no acute distress.  Neck: Supple, no lymphadenopathy no thyromegaly.  Lungs: Clear to auscultation bilaterally.  Cardiac Astro: Regular rate rhythm.  Abdomen: Soft, nontender, nondistended. Mildly obese. There is no palpable masses or hepatosplenomegaly.  Groins: No lymphadenopathy.  Extremities: No edema.  Pelvic: Normal external female genitalia. Vagina is atrophic. The cervix is multiparous. There are no lesions. There is no discharge. Bimanual examination reveals no masses or nodularity. The corpus is not enlarged.

## 2013-06-11 ENCOUNTER — Encounter: Payer: Self-pay | Admitting: Gynecologic Oncology

## 2013-06-11 ENCOUNTER — Ambulatory Visit: Payer: BC Managed Care – PPO | Attending: Gynecologic Oncology | Admitting: Gynecologic Oncology

## 2013-06-11 ENCOUNTER — Other Ambulatory Visit: Payer: BC Managed Care – PPO | Admitting: Lab

## 2013-06-11 VITALS — BP 116/70 | HR 70 | Temp 98.0°F | Resp 16

## 2013-06-11 DIAGNOSIS — C541 Malignant neoplasm of endometrium: Secondary | ICD-10-CM

## 2013-06-11 DIAGNOSIS — E119 Type 2 diabetes mellitus without complications: Secondary | ICD-10-CM

## 2013-06-11 DIAGNOSIS — Z975 Presence of (intrauterine) contraceptive device: Secondary | ICD-10-CM | POA: Insufficient documentation

## 2013-06-11 DIAGNOSIS — Z794 Long term (current) use of insulin: Secondary | ICD-10-CM | POA: Insufficient documentation

## 2013-06-11 DIAGNOSIS — M4310 Spondylolisthesis, site unspecified: Secondary | ICD-10-CM | POA: Insufficient documentation

## 2013-06-11 DIAGNOSIS — C549 Malignant neoplasm of corpus uteri, unspecified: Secondary | ICD-10-CM | POA: Insufficient documentation

## 2013-06-11 LAB — HEMOGLOBIN A1C: Mean Plasma Glucose: 177 mg/dL — ABNORMAL HIGH (ref <117)

## 2013-06-11 NOTE — Progress Notes (Signed)
Office Visit:  GYN ONCOLOGY  CC:  Chief Complaint   Patient presents with   .  endometrial cancer    Assessment/Plan:  57 y.o. with a clinical stage I grade 1 endometrial carcinoma that CT scan also that did not reveal any evidence of metastatic disease.Mirena IUD in place until better glucose management is obtained.    Check HgB A1c today.  Will perform endometrial cancer staging procedure when the level is less that 7.5.   HPI:  Patient is seen today in consultation at the request of Dr. Seymour Bars for a newly diagnosed endometrial cancer. Patient is a 57 year old gravida 1 para 1 who began having some bleeding about 6 months ago. The bleeding has been like spotting with no significant cramping or pain. She does a history of having irregular menstrual cycles. She's been menopausal for essentially 5 years. She never took any hormone replacement therapy. Due to the persistent nature for spotting she went to urgent care and his was referred to Dr. Seymour Bars.  Dr. Seymour Bars performed an endometrial biopsy on February 13 2013. 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 She also had a CT scan of the abdomen and pelvis the findings which are below.  Findings: The lung bases are clear. The liver enhances with no focal abnormality and no ductal dilatation is seen. No calcified gallstones are noted. The pancreas is normal in size although somewhat atrophic. The pancreatic duct is not dilated. The adrenal glands and spleen are unremarkable. The stomach is moderately fluid distended with no abnormality noted. The kidneys enhance with no calculus or mass and no hydronephrosis is seen. On delayed images, the pelvocaliceal systems are unremarkable. The proximal ureters are normal in caliber. The abdominal aorta appears normal. No adenopathy is noted. Small mesenteric nodes are present none of which are pathologically enlarged. There is low attenuation within the uterus in this patient with recently diagnosed  endometrial carcinoma. No pelvic adenopathy is seen. The ovaries are within normal limits in size. No free fluid is seen within the pelvis. The urinary bladder is moderately distended with no abnormality noted. No abnormality of the colon is seen. The terminal ileum is unremarkable. The appendix is visualized filling with air and extending cephalad to the left of midline, with no inflammatory process noted. There is a 4 mm anterolisthesis of L4 on L5 which appears to be due to degenerative change involving the facet joints.  IMPRESSION:  1. Abnormal appearance of the uterus in this patient with recently diagnosed endometrial carcinoma. No evidence of metastatic involvement of the abdomen or pelvis and no adenopathy is seen.  2. 4 mm anterolisthesis of L4 on L5 which appears to be due to degenerative change   RTH BSO LND was planned for 03/2013.  HgbA1C returned 10.6.  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    Review of Systems  Her DM is now managed by Dr. Everlene Other.  FBS 180 this am . She denies any neuropathy related to her diabetes. No N/V no abdominal pain, no rectal or vaginal bleeding.  No vaginal discharge.  Does not check for the IOUD strings.  No HA no SOB no CP, no fatigue.  ROS non contributory.   Current Meds:  Outpatient Encounter Prescriptions as of 03/07/2013   Medication  Sig  Dispense  Refill   .  insulin NPH-insulin regular (NOVOLIN 70/30) (70-30) 100 UNIT/ML injection  Inject into the skin 1 day or  1 dose.      No facility-administered encounter medications on file as of 03/07/2013.      Social Hx:  History    Social History   .  Marital Status:  Single     Spouse Name:  N/A     Number of Children:  N/A   .  Years of Education:  N/A    Occupational History   .  Not on file.    Social History Main Topics   .  Smoking status:  Never Smoker   .  Smokeless tobacco:  Not on file   .  Alcohol Use:  No   .   Drug Use:  No   .  Sexually Active:  Not on file    Other Topics  Concern   .  Not on file    Social History Narrative   .  No narrative on file   Past Surgical Hx: History reviewed.Uterine curettage and IUD placement 02/2013  Past Medical Hx:  Past Medical History   Diagnosis  Date   .  Postmenopausal bleeding    .  Diabetes mellitus without complication    Endometrial cancer  Family Hx:  Family History   Problem  Relation  Age of Onset   .  Diabetes  Mother    .  Hypertension  Mother    .  Breast cancer  Mother    .  Diabetes  Father    Vitals:  Physical Exam:  Vitals BP 116/70  Pulse 70  Temp(Src) 98 F (36.7 C) (Oral)  Resp 16 Well-nourished well-developed female in no acute distress.  Neck: Supple, no lymphadenopathy no thyromegaly.  Lungs: Clear to auscultation bilaterally.  Cardiac Astro: Regular rate rhythm.  Abdomen: Soft, nontender, nondistended. Mildly obese. There is no palpable masses or hepatosplenomegaly.  Groins: No lymphadenopathy.  Extremities: No edema.  Pelvic: Normal external female genitalia. Vagina is atrophic. The cervix is multiparous. There are no lesions. There is no bleeding or  discharge. Bimanual examination reveals no masses or nodularity.  IUD strings are visible

## 2013-06-11 NOTE — Patient Instructions (Signed)
Hgb A1c today Will schedule for surgery if less than 7.5 F/U in 3 months    Thank you very much Ms. Makayla Buchanan for allowing me to provide care for you today.  I appreciate your confidence in choosing our Gynecologic Oncology team.  If you have any questions about your visit today please call our office and we will get back to you as soon as possible.  [name]. Colyn Miron MD., PhD Gynecologic Oncology

## 2013-06-17 ENCOUNTER — Telehealth: Payer: Self-pay | Admitting: Gynecologic Oncology

## 2013-06-17 NOTE — Telephone Encounter (Signed)
Informed of HgbA1C and Dr. Forrestine Him recommendations for 3 month follow up.  No concerns voiced.

## 2013-08-20 ENCOUNTER — Ambulatory Visit: Payer: BC Managed Care – PPO | Admitting: Lab

## 2013-08-20 ENCOUNTER — Encounter: Payer: Self-pay | Admitting: Gynecologic Oncology

## 2013-08-20 ENCOUNTER — Ambulatory Visit: Payer: BC Managed Care – PPO | Attending: Gynecologic Oncology | Admitting: Gynecologic Oncology

## 2013-08-20 VITALS — BP 130/70 | HR 85 | Temp 98.6°F | Resp 16 | Ht 63.9 in | Wt 164.9 lb

## 2013-08-20 DIAGNOSIS — C541 Malignant neoplasm of endometrium: Secondary | ICD-10-CM

## 2013-08-20 DIAGNOSIS — C549 Malignant neoplasm of corpus uteri, unspecified: Secondary | ICD-10-CM | POA: Insufficient documentation

## 2013-08-20 DIAGNOSIS — Z794 Long term (current) use of insulin: Secondary | ICD-10-CM | POA: Insufficient documentation

## 2013-08-20 DIAGNOSIS — E119 Type 2 diabetes mellitus without complications: Secondary | ICD-10-CM | POA: Insufficient documentation

## 2013-08-20 DIAGNOSIS — Z975 Presence of (intrauterine) contraceptive device: Secondary | ICD-10-CM | POA: Insufficient documentation

## 2013-08-20 DIAGNOSIS — N8111 Cystocele, midline: Secondary | ICD-10-CM | POA: Insufficient documentation

## 2013-08-20 LAB — HEMOGLOBIN A1C
Hgb A1c MFr Bld: 7.4 % — ABNORMAL HIGH (ref <5.7)
Mean Plasma Glucose: 166 mg/dL — ABNORMAL HIGH (ref <117)

## 2013-08-20 NOTE — Progress Notes (Signed)
Office Visit:  GYN ONCOLOGY  CC:  Chief Complaint   Patient presents with   .  endometrial cancer    Assessment/Plan:  57 y.o. with a clinical stage I grade 1 endometrial carcinoma that CT scan also that did not reveal any evidence of metastatic disease.Mirena IUD in place until better glucose management is obtained.      Check HgB A1c today.  Will perform robotic  endometrial cancer staging procedure if her level is less than 7.5.    Ms. Makayla Buchanan will be contacted and a date selected based upon her convenience. I extensively reviewed the risks of robotic hysterectomy and possible lymph node dissection of infection, bleeding, damage to nearby organs including bowel, bladder, vessels, nerves, and ureters. We discussed postoperative risks including infection, PE/ DVT, and lymphedema. We reviewed possible need for conversion to open laparotomy, need for possible blood transfusion. I discussed positioning during surgery of trendelenberg and risks of minor facial swelling and care we take in preoperative positioning. We also reviewed the possible need for further treatment such as radiation or chemotherapy based on final pathology. Expected postoperative recovery was also discussed.   HPI:  Makayla Buchanan was initially  seen at the request of Dr. Seymour Buchanan a few months ago  for a newly diagnosed endometrial cancer.  She's been menopausal for essentially 5 years. She never took any hormone replacement therapy. Due to the persistent nature for spotting she went to urgent care and his was referred to Dr. Seymour Buchanan.  Dr. Seymour Buchanan performed an endometrial biopsy on February 13 2013. 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  She also had a CT scan of the abdomen and pelvis.   Findings: The lung bases are clear. The liver enhances with no focal abnormality and no ductal dilatation is seen. No calcified gallstones are noted. The pancreas is normal in size although somewhat atrophic. The pancreatic duct is not  dilated. The adrenal glands and spleen are unremarkable. The stomach is moderately fluid distended with no abnormality noted. The kidneys enhance with no calculus or mass and no hydronephrosis is seen. On delayed images, the pelvocaliceal systems are unremarkable. The proximal ureters are normal in caliber. The abdominal aorta appears normal. No adenopathy is noted. Small mesenteric nodes are present none of which are pathologically enlarged. There is low attenuation within the uterus in this patient with recently diagnosed endometrial carcinoma. No pelvic adenopathy is seen. The ovaries are within normal limits in size. No free fluid is seen within the pelvis. The urinary bladder is moderately distended with no abnormality noted. No abnormality of the colon is seen. The terminal ileum is unremarkable. The appendix is visualized filling with air and extending cephalad to the left of midline, with no inflammatory process noted. There is a 4 mm anterolisthesis of L4 on L5 which appears to be due to degenerative change involving the facet joints.  IMPRESSION:  1. Abnormal appearance of the uterus in this patient with recently diagnosed endometrial carcinoma. No evidence of metastatic involvement of the abdomen or pelvis and no adenopathy is seen.  2. 4 mm anterolisthesis of L4 on L5 which appears to be due to degenerative change   RTH BSO LND was planned for 03/2013.  HgbA1C returned 10.6.  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    Review of Systems  Her DM is now managed by Dr. Everlene Buchanan. HgBA1C 05/2013 7.8  . She denies any neuropathy related  to her diabetes. No N/V no abdominal pain, no rectal or vaginal bleeding.  No vaginal discharge.    No HA no SOB no CP, no fatigue.  ROS non contributory.   Current Meds:  Outpatient Encounter Prescriptions as of 03/07/2013   Medication  Sig  Dispense  Refill   .  insulin NPH-insulin regular (NOVOLIN  70/30) (70-30) 100 UNIT/ML injection  Inject into the skin 1 day or 1 dose.      No facility-administered encounter medications on file as of 03/07/2013.      Social Hx:  History    Social History   .  Marital Status:  Single     Spouse Name:  N/A     Number of Children:  N/A   .  Years of Education:  N/A    Occupational History   .  Not on file.    Social History Main Topics   .  Smoking status:  Never Smoker   .  Smokeless tobacco:  Not on file   .  Alcohol Use:  No   .  Drug Use:  No   .  Sexually Active:  Not on file    Buchanan Topics  Concern   .  Not on file    Social History Narrative   .  No narrative on file   Past Surgical Hx: History reviewed.Uterine curettage and IUD placement 02/2013  Past Medical Hx:  Past Medical History   Diagnosis  Date   .  Postmenopausal bleeding    .  Diabetes mellitus without complication    Endometrial cancer  Family Hx:  Family History   Problem  Relation  Age of Onset   .  Diabetes  Mother    .  Hypertension  Mother    .  Breast cancer  Mother    .  Diabetes  Father    Vitals:  Physical Exam:  Vitals BP 130/70  Pulse 85  Temp(Src) 98.6 F (37 C) (Oral)  Resp 16  Ht 5' 3.9" (1.623 m)  Wt 164 lb 14.4 oz (74.798 kg)  BMI 28.4 kg/m2  SpO2 99% Well-nourished well-developed female in no acute distress.  Neck: Supple, no lymphadenopathy no thyromegaly.  Lungs: Clear to auscultation bilaterally.  Cardiac Astro: Regular rate rhythm.  Abdomen: Soft, nontender, nondistended. Mildly obese. There is no palpable masses or hepatosplenomegaly.  Groins: No lymphadenopathy.  Extremities: No edema.  Pelvic: Normal external female genitalia. Vagina is atrophic. The cervix is multiparous.  There are no lesions. There is no bleeding or  discharge. Grade 1 cystocele. Bimanual examination reveals no masses or nodularity.  IUD strings are visible

## 2013-08-20 NOTE — Patient Instructions (Signed)
Will contact with HgbA1C results Will schedule for surgery based on results.   Thank you very much Ms. Makayla Buchanan for allowing me to provide care for you today.  I appreciate your confidence in choosing our Gynecologic Oncology team.  If you have any questions about your visit today please call our office and we will get back to you as soon as possible.  [name]. Akio Hudnall MD., PhD Gynecologic Oncology

## 2013-08-27 ENCOUNTER — Telehealth: Payer: Self-pay | Admitting: Gynecologic Oncology

## 2013-08-27 NOTE — Telephone Encounter (Signed)
Message left for patient asking her to please call the office so an upcoming surgical date could be discussed since her hgbA1C is 7.4.

## 2013-09-11 ENCOUNTER — Encounter (HOSPITAL_COMMUNITY): Payer: Self-pay | Admitting: Pharmacy Technician

## 2013-09-12 NOTE — Progress Notes (Signed)
EKG 03/28/13 on EPIC, chest x-ray 03/22/13 on EPIC

## 2013-09-12 NOTE — Patient Instructions (Addendum)
20 Makayla Buchanan  09/12/2013   Your procedure is scheduled on: 09/17/13  Report to [facility] at 5:15 AM.  Call this number if you have problems the morning of surgery [phone]: [phone]   Remember: clear liquids for 24 hours before surgery   Do not drink liquids After Midnight.   Do not wear jewelry or make-up.  Do not wear lotions, powders, or perfumes. You may wear deodorant.  Do not shave 48 hours prior to surgery. Men may shave face and neck.  Do not bring valuables to the hospital.  Contacts, dentures or bridgework may not be worn into surgery.  Leave suitcase in the car. After surgery it may be brought to your room.  For patients admitted to the hospital, checkout time is 11:00 AM the day of discharge.    Please read over the following fact sheets that you were given: incentive spirometry fact sheet, blood fact sheet Birdie Sons, RN  pre op nurse call if needed [phone]    FAILURE TO FOLLOW THESE INSTRUCTIONS MAY RESULT IN CANCELLATION OF YOUR SURGERY   Patient Signature: ___________________________________________

## 2013-09-13 ENCOUNTER — Encounter (HOSPITAL_COMMUNITY): Payer: Self-pay

## 2013-09-13 ENCOUNTER — Ambulatory Visit (HOSPITAL_COMMUNITY)
Admission: RE | Admit: 2013-09-13 | Discharge: 2013-09-13 | Disposition: A | Discharge status: Home / Self Care | Payer: BC Managed Care – PPO | Source: Ambulatory Visit | Attending: Gynecologic Oncology | Admitting: Gynecologic Oncology

## 2013-09-13 ENCOUNTER — Encounter (HOSPITAL_COMMUNITY)
Admission: RE | Admit: 2013-09-13 | Discharge: 2013-09-13 | Disposition: A | Discharge status: Home / Self Care | Payer: BC Managed Care – PPO | Source: Ambulatory Visit | Attending: Gynecologic Oncology | Admitting: Gynecologic Oncology

## 2013-09-13 DIAGNOSIS — Z01812 Encounter for preprocedural laboratory examination: Secondary | ICD-10-CM | POA: Insufficient documentation

## 2013-09-13 DIAGNOSIS — C549 Malignant neoplasm of corpus uteri, unspecified: Secondary | ICD-10-CM | POA: Insufficient documentation

## 2013-09-13 DIAGNOSIS — Z01818 Encounter for other preprocedural examination: Secondary | ICD-10-CM | POA: Insufficient documentation

## 2013-09-13 LAB — COMPREHENSIVE METABOLIC PANEL
BUN: 11 mg/dL (ref 6–23)
CO2: 26 mEq/L (ref 19–32)
Chloride: 107 mEq/L (ref 96–112)
Creatinine, Ser: 0.65 mg/dL (ref 0.50–1.10)
GFR calc non Af Amer: >90 mL/min (ref >90)
Total Bilirubin: 0.2 mg/dL — ABNORMAL LOW (ref 0.3–1.2)

## 2013-09-13 LAB — CBC WITH DIFFERENTIAL/PLATELET
HCT: 37.1 % (ref 36.0–46.0)
Hemoglobin: 12 g/dL (ref 12.0–15.0)
Lymphocytes Relative: 42 % (ref 12–46)
Lymphs Abs: 2.8 10*3/uL (ref 0.7–4.0)
Monocytes Absolute: 0.4 10*3/uL (ref 0.1–1.0)
Monocytes Relative: 5 % (ref 3–12)
Neutro Abs: 3.4 10*3/uL (ref 1.7–7.7)
WBC: 6.6 10*3/uL (ref 4.0–10.5)

## 2013-09-13 LAB — URINALYSIS, ROUTINE W REFLEX MICROSCOPIC
Hgb urine dipstick: NEGATIVE
Ketones, ur: NEGATIVE mg/dL
Protein, ur: NEGATIVE mg/dL
Urobilinogen, UA: 0.2 mg/dL (ref 0.0–1.0)

## 2013-09-13 LAB — URINE MICROSCOPIC-ADD ON

## 2013-09-16 ENCOUNTER — Telehealth: Payer: Self-pay | Admitting: Gynecologic Oncology

## 2013-09-16 NOTE — Telephone Encounter (Signed)
Telephone call to check on pre-operative status.  Patient complaint with pre-operative instructions.  Reinforced NPO after midnight.  No questions or concerns voiced.  Instructed to call for any needs. 

## 2013-09-17 ENCOUNTER — Encounter (HOSPITAL_COMMUNITY)
Admission: RE | Disposition: A | Discharge status: Home / Self Care | Payer: Self-pay | Source: Ambulatory Visit | Attending: Obstetrics & Gynecology

## 2013-09-17 ENCOUNTER — Encounter (HOSPITAL_COMMUNITY): Payer: Self-pay | Admitting: *Deleted

## 2013-09-17 ENCOUNTER — Ambulatory Visit (HOSPITAL_COMMUNITY)
Admission: RE | Admit: 2013-09-17 | Discharge: 2013-09-18 | Disposition: A | Discharge status: Home / Self Care | Payer: BC Managed Care – PPO | Source: Ambulatory Visit | Attending: Obstetrics & Gynecology | Admitting: Obstetrics & Gynecology

## 2013-09-17 ENCOUNTER — Ambulatory Visit (HOSPITAL_COMMUNITY): Payer: BC Managed Care – PPO | Admitting: Anesthesiology

## 2013-09-17 ENCOUNTER — Encounter (HOSPITAL_COMMUNITY): Payer: Self-pay | Admitting: Anesthesiology

## 2013-09-17 DIAGNOSIS — E119 Type 2 diabetes mellitus without complications: Secondary | ICD-10-CM | POA: Diagnosis not present

## 2013-09-17 DIAGNOSIS — N9489 Other specified conditions associated with female genital organs and menstrual cycle: Secondary | ICD-10-CM | POA: Diagnosis not present

## 2013-09-17 DIAGNOSIS — Q762 Congenital spondylolisthesis: Secondary | ICD-10-CM | POA: Diagnosis not present

## 2013-09-17 DIAGNOSIS — Z794 Long term (current) use of insulin: Secondary | ICD-10-CM | POA: Insufficient documentation

## 2013-09-17 DIAGNOSIS — C549 Malignant neoplasm of corpus uteri, unspecified: Secondary | ICD-10-CM | POA: Insufficient documentation

## 2013-09-17 DIAGNOSIS — C541 Malignant neoplasm of endometrium: Secondary | ICD-10-CM | POA: Diagnosis present

## 2013-09-17 HISTORY — PX: LYMPH NODE DISSECTION: SHX5087

## 2013-09-17 HISTORY — PX: ROBOTIC ASSISTED TOTAL HYSTERECTOMY WITH BILATERAL SALPINGO OOPHERECTOMY: SHX6086

## 2013-09-17 LAB — GLUCOSE, CAPILLARY
Glucose-Capillary: 131 mg/dL — ABNORMAL HIGH (ref 70–99)
Glucose-Capillary: 133 mg/dL — ABNORMAL HIGH (ref 70–99)
Glucose-Capillary: 165 mg/dL — ABNORMAL HIGH (ref 70–99)
Glucose-Capillary: 97 mg/dL (ref 70–99)
Glucose-Capillary: 99 mg/dL (ref 70–99)

## 2013-09-17 LAB — TYPE AND SCREEN: ABO/RH(D): B POS

## 2013-09-17 SURGERY — ROBOTIC ASSISTED TOTAL HYSTERECTOMY WITH BILATERAL SALPINGO OOPHORECTOMY
Anesthesia: General | Wound class: Clean Contaminated

## 2013-09-17 MED ORDER — CEFAZOLIN SODIUM-DEXTROSE 2-3 GM-% IV SOLR
2.0000 g | INTRAVENOUS | Status: AC
  Administered 2013-09-17: 2 g via INTRAVENOUS

## 2013-09-17 MED ORDER — OXYCODONE-ACETAMINOPHEN 5-325 MG PO TABS
1.0000 | ORAL_TABLET | ORAL | Status: DC | PRN
  Administered 2013-09-18: 1 via ORAL
  Filled 2013-09-17: qty 1

## 2013-09-17 MED ORDER — METFORMIN HCL 500 MG PO TABS
500.0000 mg | ORAL_TABLET | Freq: Two times a day (BID) | ORAL | Status: DC
  Administered 2013-09-17 – 2013-09-18 (×2): 500 mg via ORAL
  Filled 2013-09-17 (×4): qty 1

## 2013-09-17 MED ORDER — ROCURONIUM BROMIDE 100 MG/10ML IV SOLN
INTRAVENOUS | Status: DC | PRN
  Administered 2013-09-17: 30 mg via INTRAVENOUS
  Administered 2013-09-17: 20 mg via INTRAVENOUS
  Administered 2013-09-17: 5 mg via INTRAVENOUS

## 2013-09-17 MED ORDER — NEOSTIGMINE METHYLSULFATE 1 MG/ML IJ SOLN
INTRAMUSCULAR | Status: DC | PRN
  Administered 2013-09-17: 4 mg via INTRAVENOUS

## 2013-09-17 MED ORDER — ONDANSETRON HCL 4 MG PO TABS: 4.0000 mg | ORAL_TABLET | Freq: Four times a day (QID) | ORAL | Status: DC | PRN

## 2013-09-17 MED ORDER — FENTANYL CITRATE 0.05 MG/ML IJ SOLN
INTRAMUSCULAR | Status: DC | PRN
  Administered 2013-09-17: 50 ug via INTRAVENOUS
  Administered 2013-09-17: 25 ug via INTRAVENOUS
  Administered 2013-09-17 (×2): 50 ug via INTRAVENOUS
  Administered 2013-09-17: 100 ug via INTRAVENOUS
  Administered 2013-09-17: 50 ug via INTRAVENOUS
  Administered 2013-09-17: 25 ug via INTRAVENOUS

## 2013-09-17 MED ORDER — HYDROMORPHONE HCL PF 1 MG/ML IJ SOLN
INTRAMUSCULAR | Status: AC
  Filled 2013-09-17: qty 1

## 2013-09-17 MED ORDER — PROMETHAZINE HCL 25 MG/ML IJ SOLN: 6.2500 mg | INTRAMUSCULAR | Status: DC | PRN

## 2013-09-17 MED ORDER — SUCCINYLCHOLINE CHLORIDE 20 MG/ML IJ SOLN
INTRAMUSCULAR | Status: DC | PRN
  Administered 2013-09-17: 100 mg via INTRAVENOUS

## 2013-09-17 MED ORDER — HEPARIN SODIUM (PORCINE) 5000 UNIT/ML IJ SOLN
5000.0000 [IU] | INTRAMUSCULAR | Status: AC
  Administered 2013-09-17: 5000 [IU] via SUBCUTANEOUS
  Filled 2013-09-17 (×2): qty 1

## 2013-09-17 MED ORDER — KETOROLAC TROMETHAMINE 30 MG/ML IJ SOLN
15.0000 mg | Freq: Four times a day (QID) | INTRAMUSCULAR | Status: DC | PRN
  Administered 2013-09-17: 15 mg via INTRAVENOUS
  Filled 2013-09-17: qty 1

## 2013-09-17 MED ORDER — ONDANSETRON HCL 4 MG/2ML IJ SOLN
INTRAMUSCULAR | Status: DC | PRN
  Administered 2013-09-17: 4 mg via INTRAVENOUS

## 2013-09-17 MED ORDER — GLYCOPYRROLATE 0.2 MG/ML IJ SOLN
INTRAMUSCULAR | Status: DC | PRN
  Administered 2013-09-17: 0.6 mg via INTRAVENOUS

## 2013-09-17 MED ORDER — CEFAZOLIN SODIUM-DEXTROSE 2-3 GM-% IV SOLR
INTRAVENOUS | Status: AC
  Filled 2013-09-17: qty 50

## 2013-09-17 MED ORDER — ZOLPIDEM TARTRATE 5 MG PO TABS: 5.0000 mg | ORAL_TABLET | Freq: Every evening | ORAL | Status: DC | PRN

## 2013-09-17 MED ORDER — MIDAZOLAM HCL 5 MG/5ML IJ SOLN
INTRAMUSCULAR | Status: DC | PRN
  Administered 2013-09-17 (×2): 1 mg via INTRAVENOUS

## 2013-09-17 MED ORDER — KETOROLAC TROMETHAMINE 15 MG/ML IJ SOLN
INTRAMUSCULAR | Status: AC
  Administered 2013-09-17: 15 mg
  Filled 2013-09-17: qty 1

## 2013-09-17 MED ORDER — KETOROLAC TROMETHAMINE 15 MG/ML IJ SOLN
INTRAMUSCULAR | Status: AC
  Filled 2013-09-17: qty 1

## 2013-09-17 MED ORDER — LACTATED RINGERS IR SOLN
Status: DC | PRN
  Administered 2013-09-17: 1000 mL

## 2013-09-17 MED ORDER — INSULIN GLARGINE 100 UNIT/ML ~~LOC~~ SOLN
25.0000 [IU] | Freq: Every day | SUBCUTANEOUS | Status: DC
  Administered 2013-09-18: 25 [IU] via SUBCUTANEOUS
  Filled 2013-09-17: qty 0.25

## 2013-09-17 MED ORDER — STERILE WATER FOR IRRIGATION IR SOLN
Status: DC | PRN
  Administered 2013-09-17: 3000 mL

## 2013-09-17 MED ORDER — INSULIN ASPART 100 UNIT/ML ~~LOC~~ SOLN
0.0000 [IU] | SUBCUTANEOUS | Status: DC
  Administered 2013-09-17: 2 [IU] via SUBCUTANEOUS
  Administered 2013-09-17: 3 [IU] via SUBCUTANEOUS
  Administered 2013-09-18: 08:00:00 via SUBCUTANEOUS
  Administered 2013-09-18: 2 [IU] via SUBCUTANEOUS

## 2013-09-17 MED ORDER — PROPOFOL 10 MG/ML IV BOLUS
INTRAVENOUS | Status: DC | PRN
  Administered 2013-09-17: 140 mg via INTRAVENOUS

## 2013-09-17 MED ORDER — KCL IN DEXTROSE-NACL 20-5-0.45 MEQ/L-%-% IV SOLN
INTRAVENOUS | Status: DC
  Administered 2013-09-17: 13:00:00 via INTRAVENOUS
  Filled 2013-09-17 (×2): qty 1000

## 2013-09-17 MED ORDER — HYDROMORPHONE HCL PF 1 MG/ML IJ SOLN
0.2500 mg | INTRAMUSCULAR | Status: DC | PRN
  Administered 2013-09-17 (×2): 0.5 mg via INTRAVENOUS

## 2013-09-17 MED ORDER — ONDANSETRON HCL 4 MG/2ML IJ SOLN
4.0000 mg | Freq: Four times a day (QID) | INTRAMUSCULAR | Status: DC | PRN
  Administered 2013-09-18: 4 mg via INTRAVENOUS
  Filled 2013-09-17: qty 2

## 2013-09-17 MED ORDER — KETOROLAC TROMETHAMINE 30 MG/ML IJ SOLN: 15.0000 mg | Freq: Four times a day (QID) | INTRAMUSCULAR | Status: DC | PRN

## 2013-09-17 MED ORDER — ENOXAPARIN SODIUM 40 MG/0.4ML ~~LOC~~ SOLN
40.0000 mg | SUBCUTANEOUS | Status: DC
  Administered 2013-09-18: 40 mg via SUBCUTANEOUS
  Filled 2013-09-17: qty 0.4

## 2013-09-17 MED ORDER — HYDROMORPHONE HCL PF 1 MG/ML IJ SOLN
0.5000 mg | INTRAMUSCULAR | Status: DC | PRN
  Administered 2013-09-17 – 2013-09-18 (×3): 0.5 mg via INTRAVENOUS
  Filled 2013-09-17 (×3): qty 1

## 2013-09-17 MED ORDER — LACTATED RINGERS IV SOLN
INTRAVENOUS | Status: DC | PRN
  Administered 2013-09-17 (×2): via INTRAVENOUS

## 2013-09-17 MED ORDER — LACTATED RINGERS IV SOLN: INTRAVENOUS | Status: DC

## 2013-09-17 MED ORDER — PHENYLEPHRINE HCL 10 MG/ML IJ SOLN
INTRAMUSCULAR | Status: DC | PRN
  Administered 2013-09-17: 40 ug via INTRAVENOUS
  Administered 2013-09-17: 80 ug via INTRAVENOUS

## 2013-09-17 SURGICAL SUPPLY — 57 items
APL SKNCLS STERI-STRIP NONHPOA (GAUZE/BANDAGES/DRESSINGS) ×1
BAG SPEC RTRVL LRG 6X4 10 (ENDOMECHANICALS) ×2
BENZOIN TINCTURE PRP APPL 2/3 (GAUZE/BANDAGES/DRESSINGS) ×2 IMPLANT
CHLORAPREP W/TINT 26ML (MISCELLANEOUS) ×2 IMPLANT
CLOTH BEACON ORANGE TIMEOUT ST (SAFETY) ×2 IMPLANT
CORDS BIPOLAR (ELECTRODE) ×2 IMPLANT
COVER MAYO STAND STRL (DRAPES) ×2 IMPLANT
COVER SURGICAL LIGHT HANDLE (MISCELLANEOUS) ×2 IMPLANT
COVER TIP SHEARS 8 DVNC (MISCELLANEOUS) ×1 IMPLANT
COVER TIP SHEARS 8MM DA VINCI (MISCELLANEOUS) ×1
DECANTER SPIKE VIAL GLASS SM (MISCELLANEOUS) ×2 IMPLANT
DRAPE LG THREE QUARTER DISP (DRAPES) ×4 IMPLANT
DRAPE SURG IRRIG POUCH 19X23 (DRAPES) ×2 IMPLANT
DRAPE TABLE BACK 44X90 PK DISP (DRAPES) ×4 IMPLANT
DRAPE UTILITY XL STRL (DRAPES) ×2 IMPLANT
DRAPE WARM FLUID 44X44 (DRAPE) ×2 IMPLANT
DRSG TEGADERM 2-3/8X2-3/4 SM (GAUZE/BANDAGES/DRESSINGS) ×6 IMPLANT
DRSG TEGADERM 4X4.75 (GAUZE/BANDAGES/DRESSINGS) ×3 IMPLANT
DRSG TEGADERM 6X8 (GAUZE/BANDAGES/DRESSINGS) ×4 IMPLANT
ELECT REM PT RETURN 9FT ADLT (ELECTROSURGICAL) ×2
ELECTRODE REM PT RTRN 9FT ADLT (ELECTROSURGICAL) ×1 IMPLANT
FILTER SMOKE EVAC LAPAROSHD (FILTER) ×2 IMPLANT
GAUZE SPONGE 2X2 8PLY STRL LF (GAUZE/BANDAGES/DRESSINGS) ×2 IMPLANT
GAUZE VASELINE 3X9 (GAUZE/BANDAGES/DRESSINGS) IMPLANT
GLOVE BIO SURGEON STRL SZ 6.5 (GLOVE) ×8 IMPLANT
GLOVE BIO SURGEON STRL SZ7.5 (GLOVE) ×4 IMPLANT
GLOVE INDICATOR 8.0 STRL GRN (GLOVE) ×4 IMPLANT
GOWN PREVENTION PLUS XLARGE (GOWN DISPOSABLE) ×10 IMPLANT
GOWN STRL REIN XL XLG (GOWN DISPOSABLE) ×4 IMPLANT
HOLDER FOLEY CATH W/STRAP (MISCELLANEOUS) ×2 IMPLANT
KIT ACCESSORY DA VINCI DISP (KITS) ×1
KIT ACCESSORY DVNC DISP (KITS) ×1 IMPLANT
MANIPULATOR UTERINE 4.5 ZUMI (MISCELLANEOUS) ×2 IMPLANT
OCCLUDER COLPOPNEUMO (BALLOONS) ×2 IMPLANT
POUCH SPECIMEN RETRIEVAL 10MM (ENDOMECHANICALS) ×4 IMPLANT
SET TUBE IRRIG SUCTION NO TIP (IRRIGATION / IRRIGATOR) ×2 IMPLANT
SHEET LAVH (DRAPES) ×2 IMPLANT
SOLUTION ELECTROLUBE (MISCELLANEOUS) ×2 IMPLANT
SPONGE GAUZE 2X2 STER 10/PKG (GAUZE/BANDAGES/DRESSINGS) ×2
SPONGE LAP 18X18 X RAY DECT (DISPOSABLE) IMPLANT
STRIP CLOSURE SKIN 1/2X4 (GAUZE/BANDAGES/DRESSINGS) ×2 IMPLANT
SUT VIC AB 0 CT1 27 (SUTURE) ×2
SUT VIC AB 0 CT1 27XBRD ANTBC (SUTURE) ×1 IMPLANT
SUT VIC AB 4-0 PS2 27 (SUTURE) ×4 IMPLANT
SUT VICRYL 0 UR6 27IN ABS (SUTURE) ×2 IMPLANT
SUT VLOC 180 2-0 9IN GS21 (SUTURE) ×2 IMPLANT
SYR 50ML LL SCALE MARK (SYRINGE) ×2 IMPLANT
SYR BULB IRRIGATION 50ML (SYRINGE) IMPLANT
TOWEL OR 17X26 10 PK STRL BLUE (TOWEL DISPOSABLE) ×4 IMPLANT
TRAP SPECIMEN MUCOUS 40CC (MISCELLANEOUS) IMPLANT
TRAY FOLEY CATH 14FRSI W/METER (CATHETERS) ×2 IMPLANT
TRAY LAP CHOLE (CUSTOM PROCEDURE TRAY) ×2 IMPLANT
TROCAR 12M 150ML BLUNT (TROCAR) ×2 IMPLANT
TROCAR BLADELESS OPT 5 75 (ENDOMECHANICALS) ×2 IMPLANT
TROCAR XCEL 12X100 BLDLESS (ENDOMECHANICALS) ×2 IMPLANT
TUBING INSUFFLATION 10FT LAP (TUBING) ×2 IMPLANT
WATER STERILE IRR 1500ML POUR (IV SOLUTION) ×4 IMPLANT

## 2013-09-17 NOTE — Transfer of Care (Signed)
Immediate Anesthesia Transfer of Care Note  Patient: Makayla Buchanan  Procedure(s) Performed: Procedure(s): ROBOTIC ASSISTED TOTAL HYSTERECTOMY WITH BILATERAL SALPINGO OOPHORECTOMY (N/A) LYMPH NODE DISECTION/STAGGING (N/A)  Patient Location: PACU  Anesthesia Type:General  Level of Consciousness: awake, alert , oriented and patient cooperative  Airway & Oxygen Therapy: Patient Spontanous Breathing and Patient connected to face mask oxygen  Post-op Assessment: Report given to PACU RN and Post -op Vital signs reviewed and stable  Post vital signs: Reviewed and stable  Complications: No apparent anesthesia complications

## 2013-09-17 NOTE — Preoperative (Signed)
Beta Blockers   Reason not to administer Beta Blockers:Not Applicable 

## 2013-09-17 NOTE — Op Note (Signed)
Preoperative Diagnosis: Grade 1 endometrial cancer  Postoperative Diagnosis: Grade 1 endometrial cancer  Procedure(s) Performed: Robotic total laparoscopic hysterectomy, Bilateral salpingo oophorectomy,  Bilateral pelvic lymph node dissection  Anesthesia: GET  Surgeon: [name].  Nelly Rout, M.D. PhD  Assistant Surgeon:  Antionette Char MD.   Specimens: Uterus cervix, ovaries, tubes, left and right pelvic lymph nodes  Complications:None  Indication for Procedure: This is a 56 y.o.  who underwent prior Mirena IUD placement for grade 1 endometrial cancer because her IDDM was uncontrolled.  The patient presents for surgical staging.    Operative Findings:  8cm uterus.normal  adnexa. No intra-abdominal masses.   Frozen pathology was consistent with grade 1 endometrial cancer with less than 50% myometrial invasion.  Procedure: Patient was taken to the operating room and placed under general endotracheal anesthesia without any difficulty. The upper extremities were padded and she was placed in the dorsal lithotomy position and secured to the operative table over the chest with tape.   The patient was prepped and draped and the uterine manipulator placed within the endometrial cavity. The appropriately sized Koh ring was circumferentially around the cervix. The balloon was placed within the vagina. An OG tube was present and functional. At an area on the left in line with the nipple approximately 3 cm below the ribs the area was infiltrated with 1% lidocaine and a 5 mm Optiview inserted under direct visualization. The abdomen was insufflated to 15 mm of mercury and the pressure never deviated above that throughout the remainder of the procedure. Maximum Trendelenburg positioning was obtained. At approximately 23 cm proximal to the symphysis pubis an incision was made just superior to the umbilicus. This area was infiltrated with lidocaine as well as the location 10 cm lateral to this incision and 2 cm  superior to the left anterior superior iliac spine. Incisions were made. 10 mm trocar was inserted in the superior umbilicus incision. Millimeter robotic ports were placed in the other 3 incisions. The left upper quadrant port site was replaced with a 10 mm port. This was all completed under direct visualization. The small and large bowel were reflected as much as possible into the upper abdomen. The robot was docked and instruments placed.  The right round ligament was transected and the ureter was identified. The right infundibulopelvic ligament was cauterized and transected The retroperitoneal space was entered on the right and the peritoneum incised to the level of the vesicouterine ligament anteriorly. The bladder flap was created using Bovie cautery. The peritoneal dissection was continued inferiorly and across the inferior most aspect of the cervix. In this manner the urethra was deflected inferiorly. The bladder flap was further developed. The uterine vessels on the right were skeletonized ligated and transected.  The left ureter was identified. The left gonadal vessels were cauterized and transected. The broad ligament was skeletonized posteriorly to the level of the cervix and the peritoneum dissected free from the cervix and in this fashion the ureter was deflected inferiorly. The anterior peritoneum was further dissected and the bladder flap appropriately developed. The uterine vessels were skeletonized cauterized and transected. The balloon and the vagina was then maximally insufflated. A colpotomy incision was made circumferentially and the uterus cervix ovaries and tubes were ivered from the vagina. The Koh ring was removed and the balloon was replaced in the vagina..  Right pelvic lymph node dissection was then initiated. The superior vesicle artery was identified and the vesicouterine space developed. The obturator nerve was identified. Nodal tissue was  removed within the boundaries of the  right genitofemoral nerve the right circumflex vein, the ureter and the superior vesicle artery.  The nodal tissue was placed in an Endo Catch bag. The left pelvic lymph node dissection was then initiated. The superior vesicle artery was identified and the vesicouterine space developed. Nodal tissue was removed within the boundaries of the left  genitofemoral nerve the left circumflex vein, the ureter and the superior vesicle artery. The nodal tissue was placed in an Endo Catch bag.   At that time the frozen section evaluation returned and it was a grade 1 cancer with  endometrium with less than 50% myometrial invasion.   The specimens were removed through the vagina. The vaginal balloon was reinserted.  The pelvis was copiously irrigated and drained and hemostasis was assured. The vaginal cuff was closed with a running 0 Vicryl suture ligature and suture cut. The needle was removed under direct visualization. The operative site is once again visualized and hemostasis was assured. The instruments were removed from the abdomen and pelvis and the port sites irrigated. The umbilical fascia could not be closed with an interrupted 0 Vicryl  suture. The subcutaneous tissue of the umbilical left upper quadrant and right lower quadrant subcutaneous tissues were approximated with a single suture. Skin incisions were closed with a subcuticular suture.  The vaginal vault was cleared with a moist sponge stick.  Sponge, lap and needle counts were correct x 3.    The patient had sequential compression devices and preoperative heparin for VTE prophylaxis and will receive Lovenox postoperatively.          Disposition: PACU - hemodynamically stable.         Condition:stable Foley draining clear urine.

## 2013-09-17 NOTE — H&P (View-Only) (Signed)
Office Visit:  GYN ONCOLOGY  CC:  Chief Complaint   Patient presents with   .  endometrial cancer    Assessment/Plan:  57 y.o. with a clinical stage I grade 1 endometrial carcinoma that CT scan also that did not reveal any evidence of metastatic disease.Mirena IUD in place until better glucose management is obtained.      Check HgB A1c today.  Will perform robotic  endometrial cancer staging procedure if her level is less than 7.5.    Ms. Makayla Buchanan will be contacted and a date selected based upon her convenience. I extensively reviewed the risks of robotic hysterectomy and possible lymph node dissection of infection, bleeding, damage to nearby organs including bowel, bladder, vessels, nerves, and ureters. We discussed postoperative risks including infection, PE/ DVT, and lymphedema. We reviewed possible need for conversion to open laparotomy, need for possible blood transfusion. I discussed positioning during surgery of trendelenberg and risks of minor facial swelling and care we take in preoperative positioning. We also reviewed the possible need for further treatment such as radiation or chemotherapy based on final pathology. Expected postoperative recovery was also discussed.   HPI:  Makayla Buchanan was initially  seen at the request of Dr. Seymour Buchanan a few months ago  for a newly diagnosed endometrial cancer.  She's been menopausal for essentially 5 years. She never took any hormone replacement therapy. Due to the persistent nature for spotting she went to urgent care and his was referred to Dr. Seymour Buchanan.  Dr. Seymour Buchanan performed an endometrial biopsy on February 13 2013. 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  She also had a CT scan of the abdomen and pelvis.   Findings: The lung bases are clear. The liver enhances with no focal abnormality and no ductal dilatation is seen. No calcified gallstones are noted. The pancreas is normal in size although somewhat atrophic. The pancreatic duct is not  dilated. The adrenal glands and spleen are unremarkable. The stomach is moderately fluid distended with no abnormality noted. The kidneys enhance with no calculus or mass and no hydronephrosis is seen. On delayed images, the pelvocaliceal systems are unremarkable. The proximal ureters are normal in caliber. The abdominal aorta appears normal. No adenopathy is noted. Small mesenteric nodes are present none of which are pathologically enlarged. There is low attenuation within the uterus in this patient with recently diagnosed endometrial carcinoma. No pelvic adenopathy is seen. The ovaries are within normal limits in size. No free fluid is seen within the pelvis. The urinary bladder is moderately distended with no abnormality noted. No abnormality of the colon is seen. The terminal ileum is unremarkable. The appendix is visualized filling with air and extending cephalad to the left of midline, with no inflammatory process noted. There is a 4 mm anterolisthesis of L4 on L5 which appears to be due to degenerative change involving the facet joints.  IMPRESSION:  1. Abnormal appearance of the uterus in this patient with recently diagnosed endometrial carcinoma. No evidence of metastatic involvement of the abdomen or pelvis and no adenopathy is seen.  2. 4 mm anterolisthesis of L4 on L5 which appears to be due to degenerative change   RTH BSO LND was planned for 03/2013.  HgbA1C returned 10.6.  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    Review of Systems  Her DM is now managed by Dr. Everlene Buchanan. HgBA1C 05/2013 7.8  . She denies any neuropathy related  to her diabetes. No N/V no abdominal pain, no rectal or vaginal bleeding.  No vaginal discharge.    No HA no SOB no CP, no fatigue.  ROS non contributory.   Current Meds:  Outpatient Encounter Prescriptions as of 03/07/2013   Medication  Sig  Dispense  Refill   .  insulin NPH-insulin regular (NOVOLIN  70/30) (70-30) 100 UNIT/ML injection  Inject into the skin 1 day or 1 dose.      No facility-administered encounter medications on file as of 03/07/2013.      Social Hx:  History    Social History   .  Marital Status:  Single     Spouse Name:  N/A     Number of Children:  N/A   .  Years of Education:  N/A    Occupational History   .  Not on file.    Social History Main Topics   .  Smoking status:  Never Smoker   .  Smokeless tobacco:  Not on file   .  Alcohol Use:  No   .  Drug Use:  No   .  Sexually Active:  Not on file    Buchanan Topics  Concern   .  Not on file    Social History Narrative   .  No narrative on file   Past Surgical Hx: History reviewed.Uterine curettage and IUD placement 02/2013  Past Medical Hx:  Past Medical History   Diagnosis  Date   .  Postmenopausal bleeding    .  Diabetes mellitus without complication    Endometrial cancer  Family Hx:  Family History   Problem  Relation  Age of Onset   .  Diabetes  Mother    .  Hypertension  Mother    .  Breast cancer  Mother    .  Diabetes  Father    Vitals:  Physical Exam:  Vitals BP 130/70  Pulse 85  Temp(Src) 98.6 F (37 C) (Oral)  Resp 16  Ht 5' 3.9" (1.623 m)  Wt 164 lb 14.4 oz (74.798 kg)  BMI 28.4 kg/m2  SpO2 99% Well-nourished well-developed female in no acute distress.  Neck: Supple, no lymphadenopathy no thyromegaly.  Lungs: Clear to auscultation bilaterally.  Cardiac Astro: Regular rate rhythm.  Abdomen: Soft, nontender, nondistended. Mildly obese. There is no palpable masses or hepatosplenomegaly.  Groins: No lymphadenopathy.  Extremities: No edema.  Pelvic: Normal external female genitalia. Vagina is atrophic. The cervix is multiparous.  There are no lesions. There is no bleeding or  discharge. Grade 1 cystocele. Bimanual examination reveals no masses or nodularity.  IUD strings are visible

## 2013-09-17 NOTE — OR Nursing (Signed)
Patient's Mirena IUD removed by Dr. Nelly Rout.

## 2013-09-17 NOTE — Anesthesia Preprocedure Evaluation (Signed)
Anesthesia Evaluation  Patient identified by MRN, date of birth, ID band Patient awake    Reviewed: Allergy & Precautions, H&P , NPO status , Patient's Chart, lab work & pertinent test results  Airway Mallampati: II TM Distance: <3 FB Neck ROM: Full    Dental no notable dental hx.    Pulmonary neg pulmonary ROS,  breath sounds clear to auscultation  Pulmonary exam normal       Cardiovascular negative cardio ROS  Rhythm:Regular Rate:Normal     Neuro/Psych negative neurological ROS  negative psych ROS   GI/Hepatic negative GI ROS, Neg liver ROS,   Endo/Other  diabetes, Insulin Dependent  Renal/GU negative Renal ROS  negative genitourinary   Musculoskeletal negative musculoskeletal ROS (+)   Abdominal   Peds negative pediatric ROS (+)  Hematology negative hematology ROS (+)   Anesthesia Other Findings   Reproductive/Obstetrics negative OB ROS                           Anesthesia Physical Anesthesia Plan  ASA: III  Anesthesia Plan: General   Post-op Pain Management:    Induction: Intravenous  Airway Management Planned: Oral ETT  Additional Equipment:   Intra-op Plan:   Post-operative Plan: Extubation in OR  Informed Consent: I have reviewed the patients History and Physical, chart, labs and discussed the procedure including the risks, benefits and alternatives for the proposed anesthesia with the patient or authorized representative who has indicated his/her understanding and acceptance.   Dental advisory given  Plan Discussed with: CRNA and Surgeon  Anesthesia Plan Comments:         Anesthesia Quick Evaluation

## 2013-09-17 NOTE — Interval H&P Note (Signed)
History and Physical Interval Note:  09/17/2013 7:07 AM  Makayla Buchanan  has presented today for surgery, with the diagnosis of ENDOMETRIAL CANCER  The various methods of treatment have been discussed with the patient and family. After consideration of risks, benefits and other options for treatment, the patient has consented to  Procedure(s): ROBOTIC ASSISTED TOTAL HYSTERECTOMY WITH BILATERAL SALPINGO OOPHORECTOMY (N/A) POSSIBLE LYMPH NODE DISECTION/STAGGING (N/A) as a surgical intervention .  The patient's history has been reviewed, patient examined, no change in status, stable for surgery.  I have reviewed the patient's chart and labs.  Questions were answered to the patient's satisfaction.     [place], [facility]

## 2013-09-18 ENCOUNTER — Encounter (HOSPITAL_COMMUNITY): Payer: Self-pay | Admitting: Gynecologic Oncology

## 2013-09-18 DIAGNOSIS — C549 Malignant neoplasm of corpus uteri, unspecified: Secondary | ICD-10-CM | POA: Diagnosis not present

## 2013-09-18 LAB — GLUCOSE, CAPILLARY
Glucose-Capillary: 120 mg/dL — ABNORMAL HIGH (ref 70–99)
Glucose-Capillary: 141 mg/dL — ABNORMAL HIGH (ref 70–99)
Glucose-Capillary: 135 mg/dL — ABNORMAL HIGH (ref 70–99)

## 2013-09-18 LAB — CBC
HCT: 34.6 % — ABNORMAL LOW (ref 36.0–46.0)
MCH: 28.8 pg (ref 26.0–34.0)
MCHC: 32.1 g/dL (ref 30.0–36.0)
MCV: 89.6 fL (ref 78.0–100.0)
RBC: 3.86 MIL/uL — ABNORMAL LOW (ref 3.87–5.11)
RDW: 14.2 % (ref 11.5–15.5)

## 2013-09-18 LAB — BASIC METABOLIC PANEL
BUN: 5 mg/dL — ABNORMAL LOW (ref 6–23)
CO2: 27 mEq/L (ref 19–32)
Chloride: 105 mEq/L (ref 96–112)
Creatinine, Ser: 0.62 mg/dL (ref 0.50–1.10)
GFR calc Af Amer: >90 mL/min (ref >90)
Glucose, Bld: 122 mg/dL — ABNORMAL HIGH (ref 70–99)
Potassium: 3.2 mEq/L — ABNORMAL LOW (ref 3.5–5.1)

## 2013-09-18 MED ORDER — OXYCODONE-ACETAMINOPHEN 5-325 MG PO TABS: 1.0000 | ORAL_TABLET | ORAL | Status: DC | PRN

## 2013-09-18 NOTE — Discharge Summary (Signed)
Physician Discharge Summary  Patient ID: Makayla Buchanan MRN: [id number] DOB/AGE: 1956-04-30 57 y.o.  Admit date: 09/17/2013 Discharge date: 09/18/2013  Admission Diagnoses: Endometrial cancer  Discharge Diagnoses:  Principal Problem:   Endometrial cancer   Discharged Condition:  The patient is in good condition and stable for discharge.    Hospital Course: On 09/17/2013, the patient underwent the following: Procedure(s): ROBOTIC ASSISTED TOTAL HYSTERECTOMY WITH BILATERAL SALPINGO OOPHORECTOMY LYMPH NODE DISECTION/STAGING.  The postoperative course was uneventful.  She was discharged to home on postoperative day 1 tolerating a regular diet.  Consults: None  Significant Diagnostic Studies: None  Treatments: surgery: see above  Discharge Exam: Blood pressure 121/69, pulse 87, temperature 99.2 F (37.3 C), temperature source Oral, resp. rate 14, height 5\' 5"  (1.651 m), weight 163 lb (73.936 kg), SpO2 95.00%. General appearance: alert, cooperative and no distress Resp: clear to auscultation bilaterally Cardio: regular rate and rhythm, S1, S2 normal, no murmur, click, rub or gallop GI: soft, non-tender; bowel sounds normal; no masses,  no organomegaly Extremities: extremities normal, atraumatic, no cyanosis or edema Incision/Wound: Lap sites with steri strips clean, dry, and intact with no erythema or drainage  Disposition: 01-Home or Self Care  Discharge Orders   Future Appointments Provider Department Dept Phone   11/05/2013 9:15 AM Laurette Schimke, MD [facility] GYNECOLOGICAL ONCOLOGY [phone]   Future Orders Complete By Expires   Call MD for:  difficulty breathing, headache or visual disturbances  As directed    Call MD for:  extreme fatigue  As directed    Call MD for:  hives  As directed    Call MD for:  persistant dizziness or light-headedness  As directed    Call MD for:  persistant nausea and vomiting  As directed    Call MD for:  redness, tenderness, or  signs of infection (pain, swelling, redness, odor or green/yellow discharge around incision site)  As directed    Call MD for:  severe uncontrolled pain  As directed    Call MD for:  temperature >100.4  As directed    Diet - low sodium heart healthy  As directed    Driving Restrictions  As directed    Comments:     No driving for 1 week.  Do not take narcotics and drive.   Increase activity slowly  As directed    Lifting restrictions  As directed    Comments:     No lifting greater than 10 lbs.   Sexual Activity Restrictions  As directed    Comments:     No sexual activity, nothing in the vagina, for 8 weeks.       Medication List         insulin glargine 100 UNIT/ML injection  Commonly known as:  LANTUS  Inject 50 Units into the skin daily before breakfast.     metFORMIN 1000 MG tablet  Commonly known as:  GLUCOPHAGE  Take 1,000 mg by mouth 2 (two) times daily with a meal.     oxyCODONE-acetaminophen 5-325 MG per tablet  Commonly known as:  PERCOCET/ROXICET  Take 1-2 tablets by mouth every 4 (four) hours as needed (moderate to severe pain).           Follow-up Information   Follow up with Laurette Schimke, MD On 11/05/2013. (at 9:15am at the [facility])    Specialty:  Obstetrics and Gynecology   Contact information:   [address] [place] [address] [phone]  Greater than thirty minutes were spend for face to face discharge instructions and discharge orders/summary in EPIC.   Signed: Keiston Manley DEAL 09/18/2013, 8:31 AM

## 2013-09-24 ENCOUNTER — Encounter: Payer: Self-pay | Admitting: Gynecologic Oncology

## 2013-09-24 ENCOUNTER — Ambulatory Visit: Payer: BC Managed Care – PPO | Attending: Gynecologic Oncology | Admitting: Gynecologic Oncology

## 2013-09-24 VITALS — BP 100/87 | HR 103 | Temp 99.0°F | Resp 18 | Ht 63.9 in | Wt 164.4 lb

## 2013-09-24 DIAGNOSIS — E119 Type 2 diabetes mellitus without complications: Secondary | ICD-10-CM | POA: Insufficient documentation

## 2013-09-24 DIAGNOSIS — C5702 Malignant neoplasm of left fallopian tube: Secondary | ICD-10-CM

## 2013-09-24 DIAGNOSIS — Z90710 Acquired absence of both cervix and uterus: Secondary | ICD-10-CM | POA: Insufficient documentation

## 2013-09-24 DIAGNOSIS — M4310 Spondylolisthesis, site unspecified: Secondary | ICD-10-CM | POA: Insufficient documentation

## 2013-09-24 DIAGNOSIS — E785 Hyperlipidemia, unspecified: Secondary | ICD-10-CM | POA: Insufficient documentation

## 2013-09-24 DIAGNOSIS — C549 Malignant neoplasm of corpus uteri, unspecified: Secondary | ICD-10-CM | POA: Insufficient documentation

## 2013-09-24 DIAGNOSIS — K5909 Other constipation: Secondary | ICD-10-CM | POA: Insufficient documentation

## 2013-09-24 DIAGNOSIS — Z9079 Acquired absence of other genital organ(s): Secondary | ICD-10-CM | POA: Insufficient documentation

## 2013-09-24 DIAGNOSIS — C541 Malignant neoplasm of endometrium: Secondary | ICD-10-CM

## 2013-09-24 DIAGNOSIS — C5700 Malignant neoplasm of unspecified fallopian tube: Secondary | ICD-10-CM | POA: Insufficient documentation

## 2013-09-24 NOTE — Patient Instructions (Addendum)
Follow-up with Dr. Darrold Span 10/07/2013 to discuss chemotherapy     .Chemotherapy Many people are apprehensive about chemotherapy due to concerns over uncomfortable side effects. However, managements for side effects have come a long way. Many side effects once associated with chemotherapy can be prevented and/or controlled. WHAT IS CHEMOTHERAPY? Chemotherapy is the general term for any treatment involving the use of chemical agents. Chemotherapy can be given through a vein, most commonly through an implanted port* or PICC line.* It can also be delivered by mouth (orally) in the form of a pill. The main goal of chemotherapy is to kill cancer cells and stop them from growing. It can destroy and eliminate cancer cells where the cancer started (primary tumor location) and throughout the body, often far away from the original cancer. It is a treatment that not only targets the original cancer location, but also the entire body (systemic treatment) for full effect and results. Chemotherapy works by destroying cancer cells. Unfortunately, it cannot tell the difference between a cancer cell and some healthy cells. This results in the death of noncancerous cells, such as hair and blood cells. Harm to healthy cells is what causes side effects. These cells usually repair themselves after chemotherapy. Because some drugs work better together rather than alone, 2 or more drugs are often given at the same time. This is called combination chemotherapy. Depending on the type of cancer and how advanced it is, chemotherapy can be used for different goals:  Cure the cancer.  Keep the cancer from spreading.  Slow the cancer's growth.  Kill cancer cells that may have spread to other parts of the body from the original tumor.  Relieve symptoms caused by cancer. You and your caregiver will decide what drug or combination of drugs you will get. Your caregiver will choose the doses, how the drugs will be given, how  often, and how long you will get treatment. All of these decisions will depend on the type of cancer, where it is, how big it is, and how it is affecting your normal body functions and overall health. *Implanted port - A device that is implanted under your skin so that medicines may be delivered directly into your blood system. *PICC line (peripherally inserted central catheter) - A long, slender, flexible tube. This tube is often inserted into a vein, typically in the upper arm. The tip stops in the large central vein that leads to your heart. Document Released: 10/02/2007 Document Revised: 02/27/2012 Document Reviewed: 03/19/2009 [facility] Patient Information 2014 [place], [place].

## 2013-09-24 NOTE — Anesthesia Postprocedure Evaluation (Signed)
  Anesthesia Post-op Note  Patient: Makayla Buchanan  Procedure(s) Performed: Procedure(s) (LRB): ROBOTIC ASSISTED TOTAL HYSTERECTOMY WITH BILATERAL SALPINGO OOPHORECTOMY (N/A) LYMPH NODE DISECTION/STAGGING (N/A)  Patient Location: PACU  Anesthesia Type: General  Level of Consciousness: awake and alert   Airway and Oxygen Therapy: Patient Spontanous Breathing  Post-op Pain: mild  Post-op Assessment: Post-op Vital signs reviewed, Patient's Cardiovascular Status Stable, Respiratory Function Stable, Patent Airway and No signs of Nausea or vomiting  Last Vitals:  Filed Vitals:   09/18/13 0534  BP: 121/69  Pulse: 87  Temp: 37.3 C  Resp: 14    Post-op Vital Signs: stable   Complications: No apparent anesthesia complications

## 2013-09-24 NOTE — Progress Notes (Signed)
Office Visit:  GYN ONCOLOGY  CC:  Chief Complaint   Patient presents with   .  endometrial cancer   Fallopian tube cancer  Assessment/Plan:  57 y.o. 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  This represents a very early fallopian tube cancer that was only detected on microscopic evaluation. As such omentectomy and periaortic nodes were not assessed at the time of surgery last week.   In the presence of the patient's daughter mother and sister I advised that she receive 6 cycles of Taxol carboplatin therapy chemotherapy.  I've referred the patient to Dr.  Darrold Span  for the assessment administration of chemotherapy. I've also recommended to the patient that she undergo genetic counseling and testing given her mother's breast cancer diagnosis.  Makayla Buchanan  will followup on November 4 for a postoperative check.  I've advised her to take milk of magnesia to aid in management of her postoperative constipation.  The entirety of this visit was spent in discussion of the significance of the pathological findings and treatment recommendation    HPI:  Makayla Buchanan was initially  seen at the request of Dr. Seymour Bars a few months ago  for a newly diagnosed endometrial cancer.  She's been menopausal for essentially 5 years. She never took any hormone replacement therapy. Due to the persistent nature for spotting she went to urgent care and his was referred to Dr. Seymour Bars.  Dr. Seymour Bars performed an endometrial biopsy on February 13 2013. 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  She also had a CT scan of the abdomen and pelvis.   Findings: The lung bases are clear. The liver enhances with no focal abnormality and no ductal dilatation  is seen. No calcified gallstones are noted. The pancreas is normal in size although somewhat atrophic. The pancreatic duct is not dilated. The adrenal glands and spleen are unremarkable. The stomach is moderately fluid distended with no abnormality noted. The kidneys enhance with no calculus or mass and no hydronephrosis is seen. On delayed images, the pelvocaliceal systems are unremarkable. The proximal ureters are normal in caliber. The abdominal aorta appears normal. No adenopathy is noted. Small mesenteric nodes are present none of which are pathologically enlarged. There is low attenuation within the uterus in this patient with recently diagnosed endometrial carcinoma. No pelvic adenopathy is seen. The ovaries are within normal limits in size. No free fluid is seen within the pelvis. The urinary bladder is moderately distended with no abnormality noted. No abnormality of the colon is seen. The terminal ileum is unremarkable. The appendix is visualized filling with air and extending cephalad to the left of midline, with no inflammatory process noted. There is a 4 mm anterolisthesis of L4 on L5 which appears to be due to degenerative change involving the facet joints.  IMPRESSION:  1. Abnormal appearance of the uterus in this patient with recently diagnosed endometrial carcinoma. No evidence of metastatic involvement of the abdomen or pelvis and no adenopathy is seen.  2. 4 mm anterolisthesis of L4 on L5 which appears to be due to degenerative change   RTH BSO LND was planned for 03/2013.  HgbA1C returned 10.6.  A Mirena IUD was placed and the  patinet was advised to secure better endocrinologic  management of her IDDM.    On September 13 she underwent a robotic-assisted total laparoscopic hysterectomy bilateral salpingo-oophorectomy bilateral pelvic lymph node dissection. Pathology was notable for an invasive endometrioid grade 1 carcinoma invading 9 mm where the myometrium is 19 mm. Tumor size was 1.8 cm.  There was no lymphovascular space involvement and 0 of 10 lymph nodes were involved. On evaluation of the distal adnexa the left distal fimbriated end of the fallopian tubes showed a low grade adenocarcinoma with superficial stromal invasion present.  The morphologic features of the fimbrial tumor were completely different from the endometrial tumor. The satellite instability was not appreciated in the endometrial tumor.  The patient presents today for treatment counseling.  Review of Systems  Reports postoperative constipation. Denies vaginal bleeding or abdominal pain.  Past Surgical Hx:  Past Surgical History  Procedure Laterality Date  . Uterine fibroid surgery  20 YRS AGO  . Dilation and curettage of uterus N/A 03/26/2013    Procedure: DILATATION AND CURETTAGE with insertion of Mirena IUD;  Surgeon: Laurette Schimke, MD;  Location: [facility];  Service: Gynecology;  Laterality: N/A;  to start at 0745 per nancy. placement of Mirena.  . Robotic assisted total hysterectomy with bilateral salpingo oopherectomy N/A 09/17/2013    Procedure: ROBOTIC ASSISTED TOTAL HYSTERECTOMY WITH BILATERAL SALPINGO OOPHORECTOMY;  Surgeon: Laurette Schimke, MD;  Location: [facility] OR[facility];  Service: Gynecology;  Laterality: N/A;  . Lymph node dissection N/A 09/17/2013    Procedure: LYMPH NODE DISECTION/STAGGING;  Surgeon: Laurette Schimke, MD;  Location: [facility]S;  Service: Gynecology;  Laterality: N/A;    Past Medical Hx:  Past Medical History  Diagnosis Date  . Postmenopausal bleeding   . Diabetes mellitus without complication   . Hyperlipidemia   . Cancer     endometrial  Left Fallopian tube cancer   Family Hx:  Family History   Problem  Relation  Age of Onset   .  Diabetes  Mother    .  Hypertension  Mother    .  Breast cancer  Mother    .  Diabetes  Father    Vitals:  Physical Exam:  Vitals BP 100/87  Pulse 103  Temp(Src) 99 F (37.2 C) (Oral)  Resp 18  Ht 5' 3.9" (1.623 m)  Wt 164 lb 6.4 oz (74.571 kg)  BMI  28.31 kg/m2 WD female in NAD

## 2013-09-25 ENCOUNTER — Telehealth: Payer: Self-pay | Admitting: Oncology

## 2013-09-25 NOTE — Telephone Encounter (Signed)
C/D 09/25/13 for appt. 10/07/13

## 2013-09-25 NOTE — Telephone Encounter (Signed)
S/w pt and gve np appt 10/20 @ 10:30 w/Dr. Darrold Span Welcome packet mailed.

## 2013-10-01 ENCOUNTER — Encounter: Payer: Self-pay | Admitting: *Deleted

## 2013-10-01 ENCOUNTER — Other Ambulatory Visit: Payer: BC Managed Care – PPO

## 2013-10-06 ENCOUNTER — Other Ambulatory Visit: Payer: Self-pay | Admitting: Oncology

## 2013-10-06 DIAGNOSIS — C5702 Malignant neoplasm of left fallopian tube: Secondary | ICD-10-CM

## 2013-10-07 ENCOUNTER — Encounter: Payer: Self-pay | Admitting: Oncology

## 2013-10-07 ENCOUNTER — Telehealth: Payer: Self-pay | Admitting: *Deleted

## 2013-10-07 ENCOUNTER — Other Ambulatory Visit (HOSPITAL_BASED_OUTPATIENT_CLINIC_OR_DEPARTMENT_OTHER): Payer: BC Managed Care – PPO | Admitting: Lab

## 2013-10-07 ENCOUNTER — Ambulatory Visit (HOSPITAL_BASED_OUTPATIENT_CLINIC_OR_DEPARTMENT_OTHER): Payer: BC Managed Care – PPO | Admitting: Oncology

## 2013-10-07 ENCOUNTER — Ambulatory Visit: Payer: BC Managed Care – PPO

## 2013-10-07 ENCOUNTER — Telehealth: Payer: Self-pay

## 2013-10-07 VITALS — BP 149/74 | HR 97 | Temp 98.3°F | Resp 18 | Ht 63.9 in | Wt 160.2 lb

## 2013-10-07 DIAGNOSIS — C5702 Malignant neoplasm of left fallopian tube: Secondary | ICD-10-CM

## 2013-10-07 DIAGNOSIS — Z23 Encounter for immunization: Secondary | ICD-10-CM

## 2013-10-07 DIAGNOSIS — C5700 Malignant neoplasm of unspecified fallopian tube: Secondary | ICD-10-CM

## 2013-10-07 DIAGNOSIS — E11319 Type 2 diabetes mellitus with unspecified diabetic retinopathy without macular edema: Secondary | ICD-10-CM

## 2013-10-07 DIAGNOSIS — C549 Malignant neoplasm of corpus uteri, unspecified: Secondary | ICD-10-CM

## 2013-10-07 DIAGNOSIS — Z98811 Dental restoration status: Secondary | ICD-10-CM

## 2013-10-07 LAB — CBC WITH DIFFERENTIAL/PLATELET
BASO%: 0.2 % (ref 0.0–2.0)
Basophils Absolute: 0 10e3/uL (ref 0.0–0.1)
EOS%: 0.8 % (ref 0.0–7.0)
Eosinophils Absolute: 0.1 10e3/uL (ref 0.0–0.5)
HCT: 31.9 % — ABNORMAL LOW (ref 34.8–46.6)
HGB: 10.3 g/dL — ABNORMAL LOW (ref 11.6–15.9)
LYMPH%: 21.3 % (ref 14.0–49.7)
MCH: 28.6 pg (ref 25.1–34.0)
MCHC: 32.5 g/dL (ref 31.5–36.0)
MCV: 88 fL (ref 79.5–101.0)
MONO#: 0.4 10e3/uL (ref 0.1–0.9)
MONO%: 5.2 % (ref 0.0–14.0)
NEUT#: 5.9 10e3/uL (ref 1.5–6.5)
NEUT%: 72.5 % (ref 38.4–76.8)
Platelets: 362 10e3/uL (ref 145–400)
RBC: 3.62 10e6/uL — ABNORMAL LOW (ref 3.70–5.45)
RDW: 14.5 % (ref 11.2–14.5)
WBC: 8.1 10e3/uL (ref 3.9–10.3)
lymph#: 1.7 10e3/uL (ref 0.9–3.3)

## 2013-10-07 LAB — COMPREHENSIVE METABOLIC PANEL (CC13)
ALT: 13 U/L (ref 0–55)
AST: 18 U/L (ref 5–34)
Albumin: 3 g/dL — ABNORMAL LOW (ref 3.5–5.0)
Alkaline Phosphatase: 63 U/L (ref 40–150)
Anion Gap: 11 meq/L (ref 3–11)
BUN: 9.9 mg/dL (ref 7.0–26.0)
CO2: 28 meq/L (ref 22–29)
Calcium: 9.1 mg/dL (ref 8.4–10.4)
Chloride: 108 meq/L (ref 98–109)
Creatinine: 0.8 mg/dL (ref 0.6–1.1)
Glucose: 178 mg/dL — ABNORMAL HIGH (ref 70–140)
Potassium: 3.8 meq/L (ref 3.5–5.1)
Sodium: 147 meq/L — ABNORMAL HIGH (ref 136–145)
Total Bilirubin: 0.26 mg/dL (ref 0.20–1.20)
Total Protein: 7.6 g/dL (ref 6.4–8.3)

## 2013-10-07 MED ORDER — INFLUENZA VAC SPLIT QUAD 0.5 ML IM SUSP
0.5000 mL | Freq: Once | INTRAMUSCULAR | Status: AC
  Administered 2013-10-07: 0.5 mL via INTRAMUSCULAR
  Filled 2013-10-07: qty 0.5

## 2013-10-07 NOTE — Telephone Encounter (Signed)
[name] her appointment with Dr. Everlene Other on 10-09-13 at 1115.  Pt. Verbalized understanding.

## 2013-10-07 NOTE — Progress Notes (Signed)
Checked in new patient with no financial issues. She [place] not been out of country. She has appt card also.

## 2013-10-07 NOTE — Telephone Encounter (Signed)
appts made and printed. Pt is aware that tx will be added. i emailed mw to add the tx's. Pt is already an established pt of Dr. Lelon Perla. gv pt the info for [place] to get her crown repaired...td

## 2013-10-07 NOTE — Progress Notes (Signed)
[facility] NEW PATIENT EVALUATION   Name: Makayla Buchanan Date: 10/07/2013 MRN: [id number] DOB: May 09, 1956  REFERRING PHYSICIAN: Laurette Schimke  CC: ML Seymour Bars, D.Bouska, Stephannie Li (ophth), K.Heckler   REASON FOR REFERRAL: Fallopian carcinoma, for consideration of adjuvant chemotherapy   HISTORY OF PRESENT ILLNESS:Makayla Buchanan is a 57 y.o. female who is seen in consultation, together with friend, at the request of  Dr Laurette Schimke, for consideration of adjuvant chemotherapy for recently diagnosed fallopian carcinoma; she also had early stage endometrial carcinoma which was treated surgically.  She has attended chemotherapy education class with friend. She does not have central catheter.    Patient was initially evaluated by Dr Seymour Bars for ~ 6 months of vaginal spotting. Endometrial biopsy in Feb 2014 found grade 1 endometrial carcinoma, with referral made to Dr Duard Brady. At that time the patient had not seen physician about diabetes in 4 years and was not checking blood sugars at home, tho she did continue to take insulin thru that time (patient states that she "did not need prescription" for the insulin); lack of medical care largely seems due to no insurance at that time. CT AP done in [facility] 02-26-2013, with low attenuation in uterus but otherwise no findings suggesting metastatic disease and adnexa not remarkable. D and C was done and Mirena IUD placed by Dr Nelly Rout on 03-26-2013, with pathology [phone] endometrioid adenocarcinoma with squamous differentiation, grade 1. Patient then established with Dr Everlene Other and diabetes was controlled adequately to allow surgery by 09-17-13, this robotic TAH, BSO and bilateral pelvic node dissection by Dr Nelly Rout. 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 Post operative course was uncomplicated. She had post operative visit with Dr Nelly Rout with discussion of the pathology and recommendatiion for 6 cycles of taxol carboplatin particularly as omentectomy and periaortic nodes were not included in the surgical staging.   REVIEW OF SYSTEMS as above, also: Patient has some minimal bloody vaginal discharge since surgery, no other bleeding. She was not on LMW heparin after DC. She has had no fever or symptoms of infection. No HA. Decreased vision left eye with laser treatment for diabetic retinal disease recommended 03-2013 but f/u not kept. Checks CBG at home once daily, not tid as instructed, symptomatic at 81 this AM, highest ~ 148. 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 No thyroid symptoms. No difficulty hearing. No SOB or other respiratory symptoms. No changes noted in breasts. Usual weight 170 lbs. Appetite improved and no nausea now. Constipation after surgery resolved with SenokotS and bowels moving daily now. Occasional GERD. No bladder symptoms. No pain now. No LE swelling. Numbness toes left foot only x 4-5 months. Legs tired with exertion since surgery.  Remainder of full 10 point review of systems negative.   ALLERGIES: Review of patient's allergies indicates no known allergies.  PAST MEDICAL/ SURGICAL HISTORY:    G1P1 DM x 20 years No other surgery Injection right eye spring 2014 Never colonoscopy Overdue mammogram  She does have some insurance coverage now  CURRENT MEDICATIONS: reviewed as listed now in EMR, on lantus and metformin Decadron (which will start at 20 mg 12 hrs prior to taxol), ativan and zofran sent to pharmacy. Flu vaccine given today.  PHARMACY [employer]  SOCIAL HISTORY: from [place], lives with husband in [place], and this friend also staying with  her now. Daughter lives locally, 3 granddaughters, 1 grandson and 1 great grand. Never smoker, no ETOH. 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 She may need short term disability as she should not be caring for infants during chemotherapy, and I have asked [facility] SW to talk with her.   FAMILY HISTORY:  Mother with breast cancer age 39, also DM and HTN Father Dm 3 brothers HTN, DM Sister healthy Daughter healthy          PHYSICAL EXAM:  height is 5' 3.9" (1.623 m) and weight is 160 lb 3.2 oz (72.666 kg). Her oral temperature is 98.3 F (36.8 C). Her blood pressure is 149/74 and her pulse is 97. Her respiration is 18.  Alert, pleasant, cooperative, very anxious and quiet. Friend supportive  HEENT:PERRL, normal hair pattern, oral mucosa moist and clear, missing tooth/ crown left lower, posterior pharynx clear. Neck supple without thyroid mass or JVD  RESPIRATORY: lungs clear to auscultation and percussion, no wheezes or rales  CARDIAC/ VASCULAR: RRR no murmur or gallop. Peripheral pulses intact  ABDOMEN:soft, nontender, surgical incisions from robotic procedure closed and appear to be healing well. Normal bowel sounds. Not distended. No HSM or mass.  LYMPH NODES: no cervical, supraclavicular, axillary or inguinal adenopathy  BREASTS: bilaterally without dominant mass, skin or nipple findings, axillae benign  NEUROLOGIC/ PSYCH: decreased sensation left toes. No other focal deficits and otherwise as above  SKIN:no rash, ecchymosis, petechiae or lesions  MUSCULOSKELETAL: no clubbing, cyanosis, edema, cords, tenderness extremities. Back nontender    LABORATORY DATA:  Results for orders placed in visit on 10/07/13 (from the past 48 hour(s))  CBC WITH DIFFERENTIAL     Status: Abnormal   Collection Time    10/07/13 10:59 AM      Result Value Range   WBC 8.1  3.9 - 10.3 10e3/uL   NEUT# 5.9  1.5 - 6.5 10e3/uL   HGB 10.3 (*) 11.6 - 15.9  g/dL   HCT 14.7 (*) 82.9 - 56.2 %   Platelets 362  145 - 400 10e3/uL   MCV 88.0  79.5 - 101.0 fL   MCH 28.6  25.1 - 34.0 pg   MCHC 32.5  31.5 - 36.0 g/dL   RBC 1.30 (*) 8.65 - 7.84 10e6/uL   RDW 14.5  11.2 - 14.5 %   lymph# 1.7  0.9 - 3.3 10e3/uL   MONO# 0.4  0.1 - 0.9 10e3/uL   Eosinophils Absolute 0.1  0.0 - 0.5 10e3/uL   Basophils Absolute 0.0  0.0 - 0.1 10e3/uL   NEUT% 72.5  38.4 - 76.8 %   LYMPH% 21.3  14.0 - 49.7 %   MONO% 5.2  0.0 - 14.0 %   EOS% 0.8  0.0 - 7.0 %   BASO% 0.2  0.0 - 2.0 %  COMPREHENSIVE METABOLIC PANEL (CC13)     Status: Abnormal   Collection Time    10/07/13 10:59 AM      Result Value Range   Sodium 147 (*) 136 - 145 mEq/L   Potassium 3.8  3.5 - 5.1 mEq/L   Chloride 108  98 - 109 mEq/L   CO2 28  22 - 29 mEq/L   Glucose 178 (*) 70 - 140 mg/dl   BUN 9.9  7.0 - 69.6 mg/dL   Creatinine 0.8  0.6 - 1.1 mg/dL  Total Bilirubin 0.26  0.20 - 1.20 mg/dL   Alkaline Phosphatase 63  40 - 150 U/L   AST 18  5 - 34 U/L   ALT 13  0 - 55 U/L   Total Protein 7.6  6.4 - 8.3 g/dL   Albumin 3.0 (*) 3.5 - 5.0 g/dL   Calcium 9.1  8.4 - 40.9 mg/dL   Anion Gap 11  3 - 11 mEq/L     No preop CA 125. This was not drawn today as close to surgery, but will check for baseline with future labwork  PATHOLOGY: Name: Makayla, Buchanan Accession #: [id number] Collected Date: 03/26/2013 OLOGY Diagnosis Endometrium, curettage - ENDOMETRIOID ADENOCARCINOMA WITH SQUAMOUS DIFFERENTIATION, FIGO GRADE I.    Patient: Makayla Buchanan, Makayla Buchanan Collected: 09/17/2013  Accession: [id number]  REPORT OF SURGICAL PATHOLOGY ADDITIONAL INFORMATION: 1. Mismatch Repair (MMR) Protein Immunohistochemistry (IHC) IHC Expression Result (Endometrial carcinoma, 1B): MLH1: Preserved nuclear expression (greater 50% tumor expression) MSH2: Preserved nuclear expression (greater 50% tumor expression) MSH6: Preserved nuclear expression (greater 50% tumor expression) PMS2: Preserved nuclear expression (greater 50% tumor  expression) * Internal control demonstrates intact nuclear expression Interpretation: NORMAL There is preserved expression of the major and minor MMR proteins. There is a very low probability that microsatellite instability (MSI) is present. However, certain clinically significant MMR protein mutations may result in preservation of nuclear expression. It is recommended that the preservation of protein expression be correlated with molecular based MSI testing. References: 1. Guidelines on Genetic Evaluation and Management of Lynch Syndrome: A Consensus Statement by the [place] Multi-Society Task Force on Colorectal Cancer Stana Bunting. Ileana Roup , MD, and others . Am Katheren Puller 2014; [phone]; doi: 10.1038/ajg.2014.186; published online 09 July 2013 2. Outcomes of screening endometrial cancer patients for Lynch syndrome by patient-administered checklist. Garner Nash MS, and others. Gynecol Oncol 2013;131(3):619-623. Guerry Bruin MD Pathologist, Electronic Signature ( Signed 09/20/2013) FINAL DIAGNOSIS Diagnosis 1. Uterus +/- tubes/ovaries, neoplastic, with cervix - INVASIVE ENDOMETRIOID CARCINOMA, FIGO GRADE I, CONFINED WITHIN INNER HALF OF THE MYOMETRIUM. - LEFT FALLOPIAN TUBE: SUPERFICIALLY INVASIVE FALLOPIAN TUBAL ADENOCARCINOMA, 0.6 1 of 4 FINAL for [place], [place] [phone]) Diagnosis(continued) CM. PLEASE SEE COMMENT FOR DETAILS - NO EVIDENCE OF ANGIOLYMPHATIC INVASION IDENTIFIED. - BILATERAL OVARIES: BENIGN OVARIAN TISSUE WITH ENDOSALPINGIOSIS, NO ATYPIA OR MALIGNANCY. - RIGHT FALLOPIAN TUBE: NO PATHOLOGIC ABNORMALITIES. - PLEASE SEE COMMENT. 2. Lymph nodes, regional resection, right pelvic - SIX LYMPH NODES, NEGATIVE FOR METASTATIC CARCINOMA (0/6). 3. Lymph nodes, regional resection, left pelvic - FOUR LYMPH NODES, NEGATIVE FOR METASTATIC CARCINOMA (0/4). Microscopic Comment 1. ONCOLOGY TABLE - UTERUS, CARCINOMA OR CARCINOSARCOMA Specimen: Uterus and bilateral fallopian tubes  and ovaries Procedure: Total hysterectomy and bilateral salpingo-oophorectomy Lymph node sampling performed: Yes Specimen integrity: Intact Maximum tumor size (cm): At least 1.8 cm Histologic type: Endometrioid carcinoma Grade: FIGO I Myometrial invasion: 0.9 cm where myometrium is 1.9 cm in thickness Cervical stromal involvement: No Extent of involvement of other organs: No Lymph vascular invasion: Not identified Peritoneal washings: No Lymph nodes: number examined 10; number positive 0 Para-aortic lymph nodes: N/A Other : (specify involvement and site): N/A TNM code: pT1a, pN0 FIGO Stage (based on pathologic findings, needs clinical correlation): IA ONCOLOGY TABLE - FALLOPIAN TUBE 1. Specimen, including laterality: Uterus, cervix, bilateral ovaries and fallopian tubes 2. Procedure: Total hysterectomy and bilateral salpingo-oophorectomy 3. Lymph node sampling performed: Yes 4. Tumor site: Left fallopian tube, distal fimbriated end 5. Tumor location in fallopian tube: Distal fimbriated end 6. Specimen integrity (intact/ruptured/disrupted): Intact 7. Tumor size (cm): 0.6  cm, glass measurement 8. Histologic type: Fallopian tubal adenocarcinoma 9. Grade: Low grade 10. Microscopic tumor extension: Superficial stromal invasion present, confined in the tubal tissue 11. Margins: Negative 12. Lymph-Vascular invasion: Not identified 13. Lymph nodes: # examined: 10; # positive: 0 14. TNM: pT1a, pN0 15. FIGO Stage (based on pathologic findings, needs clinical correlation): IA 16. Comments: Sections of the bilateral ovaries and fallopian tubes are completely submitted for microscopic examination. Sections of the left distal fimbriated end of the fallopian tube show low grade adenocarcinoma with superficial stromal invasion present, arranged in papillary pattern. 2 of 4 FINAL for Makayla Buchanan, Makayla Buchanan [phone]) Microscopic Comment(continued) Immunohistochemical stains were performed and the tumor  cells are strongly positive for ER, and positive for p53 and p16 with appropriate controls. The morphologic feature is completely different from the endometrial tumor. Clinical correlation is highly recommended. (HCL:kh 09-18-13) Abigail Miyamoto MD Pathologist, Electronic Signature (Case signed 09/19/2013) Intraoperative Diagnosis FROZEN SECTION DIAGNOSIS: ENDOMETRIOID CARCINOMA. NO DEEP MYOMETRIAL INVASION. OVARY: NO TUMOR. (HCL) Specimen Gross and Clinical Information Specimen(s) Obtained: 1. Uterus +/- tubes/ovaries, neoplastic, with cervix 2. Lymph nodes, regional resection, right pelvic 3. Lymph nodes, regional resection, left pelvic Specimen Clinical Information 1. endometrial cancer (kp) Gross 1. Specimen: Received fresh for rapid intraoperative consultation labeled uterus, cervix, bilateral tubes and ovaries. Specimen integrity (intact/incised/disrupted): An intact uterus and cervix with attached bilateral adnexa. Size and shape: 6.5 cm fundus to cervix x 4.8 cm cornu to cornu x 3.5 anterior to posterior. Weight: 67 grams, excluding bilateral adnexa. Serosa: Tan-pink and smooth. Cervix: The ectocervix is tan-pink, glistening, and hyperemic and measures 3.6 x 2.4 cm, with a 0.8 cm slit-like os. The endocervical canal is 1.6 cm in length, and tan-pink and corrugated. The cervical stroma is tan-white and fibrous. Endometrium: The triangular endometrial cavity measures 3.5 x 2.6 cm. The endometrium is tan-pink, focally hemorrhagic, focally firm and nodular. There is a 1.8 x 1.0 x 0.8 cm tan-pink polypoid lesion in the posterior fundus of the uterus. The endometrium measures 0.1 cm in thickness, and the tan firm lesion extends 0.9 cm into the myometrium. Myometrium: The myometrium is tan-pink and trabeculated, and measures up to 1.9 cm in thickness. No additional lesions are grossly identified. Right adnexa: The right fimbriated fallopian tube measures approximately 3 cm in length x  0.5 cm in diameter. The serosal surface is tan-pink and smooth. Sectioning the fallopian tube reveals a tan-pink cut surface with a pinpoint lumen. The adjacent right ovary measures 3.8 x 1.8 x 1.2 cm. the external surface is tan-pink to purple and smooth. Sectioning the ovary reveals a tan-pink fibrous cut surface, with two well-defined tan-yellow, glistening nodules measuring 0.5 and 0.7 cm in greatest dimension. Left adnexa: The left fimbriated fallopian tube measures 6.0 cm in length x 0.5 cm in diameter. The serosal surface is tan-pink and smooth. Sectioning the fallopian tube reveals a tan-pink cut surface with a pinpoint lumen. The adjacent left ovary measures 4.1 x 1.2 x 0.9 cm. The external surface is tan-pink and smooth. Sectioning the ovary     RADIOGRAPHY: CT ABDOMEN AND PELVIS WITH CONTRAST 02-26-2013   Findings: The lung bases are clear. The liver enhances with no  focal abnormality and no ductal dilatation is seen. No calcified  gallstones are noted. The pancreas is normal in size although  somewhat atrophic. The pancreatic duct is not dilated. The  adrenal glands and spleen are unremarkable. The stomach is  moderately fluid distended with no abnormality noted. The kidneys  enhance with no calculus or mass and no hydronephrosis is seen. On  delayed images, the pelvocaliceal systems are unremarkable. The  proximal ureters are normal in caliber. The abdominal aorta  appears normal. No adenopathy is noted.  Small mesenteric nodes are present none of which are pathologically  enlarged. There is low attenuation within the uterus in this  patient with recently diagnosed endometrial carcinoma. No pelvic  adenopathy is seen. The ovaries are within normal limits in size.  No free fluid is seen within the pelvis. The urinary bladder is  moderately distended with no abnormality noted. No abnormality of  the colon is seen. The terminal ileum is unremarkable. The  appendix is  visualized filling with air and extending cephalad to  the left of midline, with no inflammatory process noted. There is  a 4 mm anterolisthesis of L4 on L5 which appears to be due to  degenerative change involving the facet joints.  IMPRESSION:  1. Abnormal appearance of the uterus in this patient with recently  diagnosed endometrial carcinoma. No evidence of metastatic  involvement of the abdomen or pelvis and no adenopathy is seen.  2. 4 mm anterolisthesis of L4 on L5 which appears to be due to  degenerative change.        Study Result     CHEST 2 VIEW 09-13-13 COMPARISON: 03/22/2013  FINDINGS:  Cardiomediastinal silhouette is stable. No acute infiltrate or  pleural effusion. No pulmonary edema. Degenerative changes bilateral  shoulders. Degenerative changes thoracic spine.  IMPRESSION:  No active cardiopulmonary disease. No significant change.        DISCUSSION: we have discussed all of history as above, including findings from surgical pathology and rationale for adjuvant chemotherapy particularly for the unexpected fallopian carcinoma. We have discussed dose dense vs q 3 week taxol carboplatin, and I have clarified differences in the regimens as she had chemo teaching for q 3 week. Particularly with significant anxiety,diabetes, some preexisting peripheral neuropathy and history of noncompliance, I believe the dose dense weekly taxol may be best choice at least initially. She is willing to come to office weekly for this regimen, at least to start. She may need change in the taxotere if peripheral neuropathy worsens with underlying diabetes; she could change to q 3 week dosing if otherwise tolerates and prefers that schedule for transportation.  She is willing to be referred to Dental Medicine at [facility] as she does not have dentist. I spoke directly with Dr Osborn [facility] office to assist with scheduling f/u for the diabetic retinal problems.     IMPRESSION / PLAN:  1. 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 Will begin adjuvant taxol carboplatin using weekly dose dense schedule particularly in hopes of better tolerance. Day 1 cycle 1 10-16-13 and close MD follow up as she begins. 2.stage 1 grade 1 endometrial carcinoma: treated surgically, no adjuvant chemo or RT recommended 3.diabetes x 20 years: better controlled presently, but has not been managed closely for years until past several months. Next appointment with Dr Everlene Other early Nov. Expect more elevated blood sugars around premedication steroids for taxol, so will follow CBGs at this office with Rx and cover with SS regular insulin at chemo if needed. 4.diabetic (?) retinal problems: referral back to Dr Stephannie Li 5.preexisting peripheral neuropathy left toes: will follow on taxol 6.dental concerns: referral to [facility] 7.flu vaccine given today 8.anxiety noted with  this MD visit.   Patient and friend have had questions answered to their satisfaction and are in agreement with plan above. They can contact this office for questions or concerns at any time prior to next scheduled visit.  Time spent  60 min , including >50% discussion and coordination of care.    Xareni Kelch P, MD 10/07/2013 4:53 PM

## 2013-10-07 NOTE — Patient Instructions (Signed)
We will plan to start weekly chemotherapy on 10-16-13 (using low dose every week instead of higher doses once every 3 weeks)  We will send prescriptions for nausea medicines to [facility], which you will need to have filled at least day before chemo starts. These will be:  Zofran (ondansetron) 8 mg:  1-2 tablets every 12 hours as needed for nausea. Will not make you drowsy. Take one of these the morning AFTER chemo, whether or not you are nauseated. Ativan (lorazepam) 1 mg:  1 under tongue or swallow every 6 hours as needed for nausea. This will make you drowsy and a little forgetful around the time that you take the medicine.  Decadron (dexamethasone) steroid  4mg :  Five tablets with food 12 hours before each chemo, to decrease chance of allergic reactions to the taxol chemotherapy.  SenokotS 1-2 tablets once or twice daily is fine to keep bowels moving daily  You should check blood sugars 3 times daily after chemo, as blood sugars likely will be higher from the steroids. We will give you extra insulin and extra fluids with chemo if needed also.   You can call any time if questions or concerns   [phone]

## 2013-10-09 ENCOUNTER — Other Ambulatory Visit: Payer: Self-pay

## 2013-10-09 ENCOUNTER — Encounter (HOSPITAL_COMMUNITY): Payer: Self-pay | Admitting: Dentistry

## 2013-10-09 ENCOUNTER — Ambulatory Visit (HOSPITAL_COMMUNITY): Payer: Self-pay | Admitting: Dentistry

## 2013-10-09 VITALS — BP 129/77 | HR 84 | Temp 98.0°F

## 2013-10-09 DIAGNOSIS — K08409 Partial loss of teeth, unspecified cause, unspecified class: Secondary | ICD-10-CM

## 2013-10-09 DIAGNOSIS — K0530 Chronic periodontitis, unspecified: Secondary | ICD-10-CM

## 2013-10-09 DIAGNOSIS — C541 Malignant neoplasm of endometrium: Secondary | ICD-10-CM

## 2013-10-09 DIAGNOSIS — IMO0002 Reserved for concepts with insufficient information to code with codable children: Secondary | ICD-10-CM

## 2013-10-09 DIAGNOSIS — C549 Malignant neoplasm of corpus uteri, unspecified: Secondary | ICD-10-CM

## 2013-10-09 DIAGNOSIS — M264 Malocclusion, unspecified: Secondary | ICD-10-CM

## 2013-10-09 DIAGNOSIS — K029 Dental caries, unspecified: Secondary | ICD-10-CM

## 2013-10-09 DIAGNOSIS — K0889 Other specified disorders of teeth and supporting structures: Secondary | ICD-10-CM

## 2013-10-09 DIAGNOSIS — K089 Disorder of teeth and supporting structures, unspecified: Secondary | ICD-10-CM

## 2013-10-09 DIAGNOSIS — Z0189 Encounter for other specified special examinations: Secondary | ICD-10-CM

## 2013-10-09 DIAGNOSIS — Z98811 Dental restoration status: Secondary | ICD-10-CM

## 2013-10-09 DIAGNOSIS — C5700 Malignant neoplasm of unspecified fallopian tube: Secondary | ICD-10-CM

## 2013-10-09 DIAGNOSIS — K036 Deposits [accretions] on teeth: Secondary | ICD-10-CM

## 2013-10-09 DIAGNOSIS — M2632 Excessive spacing of fully erupted teeth: Secondary | ICD-10-CM

## 2013-10-09 MED ORDER — DEXAMETHASONE 4 MG PO TABS: ORAL_TABLET | ORAL | Status: DC

## 2013-10-09 MED ORDER — ONDANSETRON HCL 8 MG PO TABS: 8.0000 mg | ORAL_TABLET | Freq: Two times a day (BID) | ORAL | Status: DC | PRN

## 2013-10-09 MED ORDER — CHLORHEXIDINE GLUCONATE 0.12 % MT SOLN: OROMUCOSAL | Status: DC

## 2013-10-09 MED ORDER — LORAZEPAM 0.5 MG PO TABS: ORAL_TABLET | ORAL | Status: DC

## 2013-10-09 NOTE — Patient Instructions (Signed)
The patient is to contact dental medicine if acute dental problems arise during chemotherapy. After chemotherapy, the patient is a followup with a dentist of her choice for comprehensive dental evaluation and treatment as indicated. In the meantime, the patient is to use chlorhexidine rinses twice daily as instructed.  Dr. Kristin Bruins

## 2013-10-09 NOTE — Progress Notes (Signed)
DENTAL CONSULTATION  Date of Consultation:  10/09/2013 Patient Name:   Makayla Buchanan Date of Birth:   1956-07-05 Medical Record Number: [id number]  VITALS: BP 129/77  Pulse 84  Temp(Src) 98 F (36.7 C) (Oral)  CHIEF COMPLAINT: The patient was referred for a dental consultation prior to anticipated chemotherapy.  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 Patient with anticipated chemotherapy with Dr. Darrold Span to start on 10/16/2013. Patient is now seen as part of a pre-chemotherapy dental protocol evaluation to rule out dental infection that may affect the patient's systemic health during chemotherapy.  The patient currently denies having any toothache, swellings, or abscesses. Patient lost a crown on tooth #11 approximately one month ago. Patient denies any pain or discomfort after losing the crown.  The crown was fabricated by Dr. Llana Aliment approximately 5+ years ago. Dr. Freida Busman did the root canal therapy at that time along with the post and care and crown. Patient has not had a dental cleaning for over 5+ years. She currently has no regular primary dentist by her report. This is primarily due to the loss of dental insurance by her report. Patient also has no partial dentures  PROBLEM LIST: Patient Active Problem List   Diagnosis Date Noted  . Fallopian tube cancer, carcinoma 09/24/2013  . Endometrial cancer 03/07/2013  . Type II or unspecified type diabetes mellitus without mention of complication, not stated as uncontrolled 03/07/2013    PMH: Past Medical History  Diagnosis Date  . Postmenopausal bleeding   . Diabetes mellitus without complication   . Hyperlipidemia   . Cancer     endometrial    PSH: Past Surgical History  Procedure Laterality Date  . Uterine fibroid surgery  20 YRS AGO  . Dilation and curettage of uterus N/A 03/26/2013    Procedure: DILATATION AND CURETTAGE with insertion of Mirena  IUD;  Surgeon: Laurette Schimke, MD;  Location: [facility];  Service: Gynecology;  Laterality: N/A;  to start at 0745 per nancy. placement of Mirena.  . Robotic assisted total hysterectomy with bilateral salpingo oopherectomy N/A 09/17/2013    Procedure: ROBOTIC ASSISTED TOTAL HYSTERECTOMY WITH BILATERAL SALPINGO OOPHORECTOMY;  Surgeon: Laurette Schimke, MD;  Location: [facility];  Service: Gynecology;  Laterality: N/A;  . Lymph node dissection N/A 09/17/2013    Procedure: LYMPH NODE DISECTION/STAGGING;  Surgeon: Laurette Schimke, MD;  Location: [facility];  Service: Gynecology;  Laterality: N/A;    ALLERGIES: No Known Allergies  MEDICATIONS: Current Outpatient Prescriptions  Medication Sig Dispense Refill  . dexamethasone (DECADRON) 4 MG tablet Take 5 tabs with food 12 hours before taxol chemotherapy.  15 tablet  0  . insulin glargine (LANTUS) 100 UNIT/ML injection Inject 50 Units into the skin daily before breakfast.      . LORazepam (ATIVAN) 0.5 MG tablet Place 1 tablet under the tongue or swallow every 6 hours as needed for nausea. Will make drowsy.  20 tablet  0  . metFORMIN (GLUCOPHAGE) 1000 MG tablet Take 1,000 mg by mouth 2 (two) times daily with a meal.      . ondansetron (ZOFRAN) 8 MG tablet Take 1-2 tablets (8-16 mg total) by mouth every 12 (twelve) hours as needed for nausea (Will not make drowsy).  30 tablet  1   No current facility-administered medications for this visit.    LABS: Lab Results  Component Value Date   WBC 8.1 10/07/2013   HGB 10.3* 10/07/2013   HCT 31.9* 10/07/2013  MCV 88.0 10/07/2013   PLT 362 10/07/2013      Component Value Date/Time   NA 147* 10/07/2013 1059   NA 141 09/18/2013 0356   K 3.8 10/07/2013 1059   K 3.2* 09/18/2013 0356   CL 105 09/18/2013 0356   CL 109* 03/07/2013 1117   CO2 28 10/07/2013 1059   CO2 27 09/18/2013 0356   GLUCOSE 178* 10/07/2013 1059   GLUCOSE 122* 09/18/2013 0356   GLUCOSE 247* 03/07/2013 1117   BUN 9.9 10/07/2013 1059   BUN 5* 09/18/2013  0356   CREATININE 0.8 10/07/2013 1059   CREATININE 0.62 09/18/2013 0356   CALCIUM 9.1 10/07/2013 1059   CALCIUM 8.8 09/18/2013 0356   GFRNONAA >90 09/18/2013 0356   GFRAA >90 09/18/2013 0356   No results found for this basename: INR, PROTIME   No results found for this basename: PTT    SOCIAL HISTORY: History   Social History  . Marital Status: Married    Spouse Name: N/A    Number of Children: 1  . Years of Education: N/A   Occupational History  . DAYCARE     Social History Main Topics  . Smoking status: Never Smoker   . Smokeless tobacco: Never Used  . Alcohol Use: No  . Drug Use: No  . Sexual Activity: Not on file   Other Topics Concern  . Not on file   Social History Narrative  . No narrative on file    FAMILY HISTORY: Family History  Problem Relation Age of Onset  . Diabetes Mother   . Hypertension Mother   . Breast cancer Mother   . Diabetes Father      REVIEW OF SYSTEMS: Reviewed with the patient and included in dental record.   DENTAL HISTORY: CHIEF COMPLAINT: The patient was referred for a dental consultation prior to anticipated chemotherapy.  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 Patient with anticipated chemotherapy with Dr. Darrold Span to start on 10/16/2013. Patient is now seen as part of a pre-chemotherapy dental protocol evaluation to rule out dental infection that may affect the patient's systemic health during chemotherapy.  The patient currently denies having any toothache, swellings, or abscesses. Patient lost a crown on tooth #11 approximately one month ago. Patient denies any pain or discomfort after losing the crown.  The crown was fabricated by Dr. Llana Aliment approximately 5+ years ago. Dr. Freida Busman did the root canal therapy at that time along with the post and care and crown. Patient has not had a dental cleaning for over 5+ years. She currently has no regular  primary dentist by her report. This is primarily due to the loss of dental insurance by her report. Patient also has no partial dentures  DENTAL EXAMINATION:  GENERAL: The patient is a well-developed, well-nourished female in no acute distress. HEAD AND NECK: There is no palpable lymphadenopathy. The patient denies acute TMJ symptoms. INTRAORAL EXAM: The patient has normal saliva. The patient has bilateral mandibular lingual tori. DENTITION: The patient is missing tooth numbers 1, 2, 3, 15, 16, 17, 19, and 30. Tooth #11 is present as retained root segment. PERIODONTAL: Patient with chronic, advanced periodontal disease with plaque and calculus accumulations, generalized gingival recession, and tooth mobility. The patient has moderate bone loss. DENTAL CARIES/SUBOPTIMAL RESTORATIONS: The patient has multiple abfraction lesions and dental caries associated with the retained root segment #11. ENDODONTIC: Patient currently denies acute pulpitis symptoms. Patient has had a previous root  canal therapy associated with tooth #11 that is now exposed to the oral environment. CROWN AND BRIDGE: Patient previously had a crown with posting chloride tooth #11. This fell out approximately one month ago. Patient disposed of the crown with the attached post and core.  PROSTHODONTIC: The patient has never had partial dentures. OCCLUSION: The patient has a poor occlusal scheme secondary to multiple missing teeth, retained root segment #11, supra-eruption and drifting of the unopposed teeth into the edentulous areas, and right posterior crossbite from tooth numbers 26 through 32.  RADIOGRAPHIC INTERPRETATION: An orthopantogram was obtained and supplemented with a full series of dental radiographs. There are multiple missing teeth. There is supra-eruption and drifting of the unopposed teeth into the edentulous areas. There is moderate horizontal and vertical bone loss. There is a retained root segment #11. This tooth #11  has had previous root canal therapy. There is no other obvious periapical pathology or radiolucency. There is radiographic calculus noted.   ASSESSMENTS: 1. Endometrial and fallopian tube cancer. 2. Pre-chemotherapy therapy dental protocol 3. Chronic periodontitis with bone loss 4. Generalized gingival recession 5. Tooth mobility 6. Multiple abfraction lesions 7. Dental caries associated with tooth #11 8. Tooth #11 with previous root canal therapy and a lost post and core and crown.  9. Root canal therapy #11 now exposed to the oral environment 10. Multiple missing teeth 11. Supra-eruption and drifting of the unopposed teeth into the edentulous areas 12. Mandibular right posterior crossbite from tooth numbers 26-32. 13. Multiple diastemas 14. Malocclusion and poor occlusal scheme 15. Bilateral mandibular lingual tori 16. Plaque and calculus accumulations   PLAN/RECOMMENDATIONS: 1. I discussed the risks, benefits, and complications of various treatment options with the patient in relationship to her medical and dental conditions, anticipated chemotherapy, chemotherapy therapy side effects to include infection, mucositis, xerostomia, and bleeding. We discussed various treatment options to include no treatment, multiple extractions with alveoloplasty, referral to an oral surgeon for extraction of tooth #11, pre-prosthetic surgery as indicated, periodontal therapy, crown lengthening periodontal procedure, dental restorations, root canal therapy, crown and bridge therapy, implant therapy, and replacement of missing teeth as indicated. We also discussed referral to an oral surgeon, an endodontist, or periodontist if so desired. The patient currently wishes to defer all dental treatment at this time.  The patient will contact dental medicine if acute problems arise during chemotherapy.  At that time, the patient will be referred to an oral surgeon as indicated for any needed extraction.  The patient  also agreed to the use of chlorhexidine rinses on a twice daily basis to aid in this infection of the oral cavity during the active chemotherapy. The patient did refuse initial gross debridement periodontal procedure prior to start of chemotherapy. After chemotherapy, I will assist in referring the patient to primary dentist of her choice for comprehensive dental evaluation and treatment as indicated.    2. Discussion of findings with medical team and coordination of future medical and dental care as needed.  Charlynne Pander, DDS

## 2013-10-09 NOTE — Progress Notes (Signed)
Reviewed with Makayla Buchanan to take the decadron 5 tabs with food at 1230 on 10-16-13 as her treatment is at 1230 pm 10-16-13.  Patient verbalized understanding.

## 2013-10-10 ENCOUNTER — Encounter: Payer: Self-pay | Admitting: *Deleted

## 2013-10-10 NOTE — Progress Notes (Signed)
Clinical Social Work received call from Dr. Darrold Span to assist patient with applying for short-term disability.  CSW contacted patient by phone.  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  CSW explained there are no public short-term disability benefits available.  CSW suggested patient apply for SSI through the social security office.  CSW and patient plan to meet at patient's first chemotherapy treatment to explore other financial resources.  Kathrin Penner, MSW, LCSW Clinical Social Worker [facility] [phone]

## 2013-10-12 ENCOUNTER — Other Ambulatory Visit: Payer: Self-pay | Admitting: Oncology

## 2013-10-12 DIAGNOSIS — E119 Type 2 diabetes mellitus without complications: Secondary | ICD-10-CM

## 2013-10-12 DIAGNOSIS — C5702 Malignant neoplasm of left fallopian tube: Secondary | ICD-10-CM

## 2013-10-16 ENCOUNTER — Other Ambulatory Visit (HOSPITAL_BASED_OUTPATIENT_CLINIC_OR_DEPARTMENT_OTHER): Payer: BC Managed Care – PPO | Admitting: Lab

## 2013-10-16 ENCOUNTER — Encounter: Payer: Self-pay | Admitting: Oncology

## 2013-10-16 ENCOUNTER — Ambulatory Visit (HOSPITAL_BASED_OUTPATIENT_CLINIC_OR_DEPARTMENT_OTHER): Payer: BC Managed Care – PPO

## 2013-10-16 ENCOUNTER — Ambulatory Visit (HOSPITAL_BASED_OUTPATIENT_CLINIC_OR_DEPARTMENT_OTHER): Payer: BC Managed Care – PPO | Admitting: Oncology

## 2013-10-16 VITALS — BP 132/71 | HR 98 | Temp 98.3°F | Resp 18 | Ht 63.0 in | Wt 158.2 lb

## 2013-10-16 VITALS — BP 160/69 | HR 86 | Temp 98.3°F | Resp 18

## 2013-10-16 DIAGNOSIS — Z5111 Encounter for antineoplastic chemotherapy: Secondary | ICD-10-CM

## 2013-10-16 DIAGNOSIS — E119 Type 2 diabetes mellitus without complications: Secondary | ICD-10-CM

## 2013-10-16 DIAGNOSIS — F411 Generalized anxiety disorder: Secondary | ICD-10-CM

## 2013-10-16 DIAGNOSIS — C5700 Malignant neoplasm of unspecified fallopian tube: Secondary | ICD-10-CM

## 2013-10-16 DIAGNOSIS — C5702 Malignant neoplasm of left fallopian tube: Secondary | ICD-10-CM

## 2013-10-16 DIAGNOSIS — G5790 Unspecified mononeuropathy of unspecified lower limb: Secondary | ICD-10-CM

## 2013-10-16 LAB — WHOLE BLOOD GLUCOSE
Glucose: 267 mg/dL — ABNORMAL HIGH (ref 70–100)
HRS PC: 0.5 Hours
HRS PC: 2 Hours

## 2013-10-16 LAB — CBC WITH DIFFERENTIAL/PLATELET
Eosinophils Absolute: 0 10*3/uL (ref 0.0–0.5)
HCT: 34 % — ABNORMAL LOW (ref 34.8–46.6)
LYMPH%: 13.9 % — ABNORMAL LOW (ref 14.0–49.7)
MONO#: 0.1 10*3/uL (ref 0.1–0.9)
NEUT#: 6 10*3/uL (ref 1.5–6.5)
NEUT%: 85.3 % — ABNORMAL HIGH (ref 38.4–76.8)
Platelets: 214 10*3/uL (ref 145–400)
WBC: 7 10*3/uL (ref 3.9–10.3)
lymph#: 1 10*3/uL (ref 0.9–3.3)
nRBC: 0 % (ref 0–0)

## 2013-10-16 MED ORDER — DEXAMETHASONE SODIUM PHOSPHATE 20 MG/5ML IJ SOLN
INTRAMUSCULAR | Status: AC
  Filled 2013-10-16: qty 5

## 2013-10-16 MED ORDER — INSULIN REGULAR HUMAN 100 UNIT/ML IJ SOLN
6.0000 [IU] | Freq: Once | INTRAMUSCULAR | Status: AC
  Administered 2013-10-16: 6 [IU] via SUBCUTANEOUS
  Filled 2013-10-16: qty 0.06

## 2013-10-16 MED ORDER — LORAZEPAM 1 MG PO TABS
ORAL_TABLET | ORAL | Status: AC
  Filled 2013-10-16: qty 1

## 2013-10-16 MED ORDER — DIPHENHYDRAMINE HCL 50 MG/ML IJ SOLN
50.0000 mg | Freq: Once | INTRAMUSCULAR | Status: AC
  Administered 2013-10-16: 50 mg via INTRAVENOUS

## 2013-10-16 MED ORDER — LORAZEPAM 1 MG PO TABS
0.5000 mg | ORAL_TABLET | Freq: Once | ORAL | Status: AC
  Administered 2013-10-16: 0.5 mg via ORAL

## 2013-10-16 MED ORDER — FAMOTIDINE IN NACL 20-0.9 MG/50ML-% IV SOLN
INTRAVENOUS | Status: AC
  Filled 2013-10-16: qty 50

## 2013-10-16 MED ORDER — FAMOTIDINE IN NACL 20-0.9 MG/50ML-% IV SOLN
20.0000 mg | Freq: Once | INTRAVENOUS | Status: AC
  Administered 2013-10-16: 20 mg via INTRAVENOUS

## 2013-10-16 MED ORDER — SODIUM CHLORIDE 0.9 % IV SOLN
80.0000 mg/m2 | Freq: Once | INTRAVENOUS | Status: AC
  Administered 2013-10-16: 144 mg via INTRAVENOUS
  Filled 2013-10-16: qty 24

## 2013-10-16 MED ORDER — SODIUM CHLORIDE 0.9 % IV SOLN
Freq: Once | INTRAVENOUS | Status: AC
  Administered 2013-10-16: 13:00:00 via INTRAVENOUS

## 2013-10-16 MED ORDER — INSULIN REGULAR HUMAN 100 UNIT/ML IJ SOLN
8.0000 [IU] | Freq: Once | INTRAMUSCULAR | Status: AC
  Administered 2013-10-16: 13:00:00 via SUBCUTANEOUS
  Filled 2013-10-16: qty 0.08

## 2013-10-16 MED ORDER — DEXAMETHASONE SODIUM PHOSPHATE 10 MG/ML IJ SOLN
INTRAMUSCULAR | Status: AC
  Filled 2013-10-16: qty 1

## 2013-10-16 MED ORDER — DEXAMETHASONE SODIUM PHOSPHATE 10 MG/ML IJ SOLN
10.0000 mg | Freq: Once | INTRAMUSCULAR | Status: AC
  Administered 2013-10-16: 10 mg via INTRAVENOUS

## 2013-10-16 MED ORDER — SODIUM CHLORIDE 0.9 % IV SOLN
671.4000 mg | Freq: Once | INTRAVENOUS | Status: AC
  Administered 2013-10-16: 670 mg via INTRAVENOUS
  Filled 2013-10-16: qty 67

## 2013-10-16 MED ORDER — DIPHENHYDRAMINE HCL 50 MG/ML IJ SOLN
INTRAMUSCULAR | Status: AC
  Filled 2013-10-16: qty 1

## 2013-10-16 MED ORDER — ONDANSETRON 16 MG/50ML IVPB (CHCC)
INTRAVENOUS | Status: AC
  Filled 2013-10-16: qty 16

## 2013-10-16 MED ORDER — ONDANSETRON 16 MG/50ML IVPB (CHCC)
16.0000 mg | Freq: Once | INTRAVENOUS | Status: AC
  Administered 2013-10-16: 16 mg via INTRAVENOUS

## 2013-10-16 NOTE — Progress Notes (Signed)
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 She was brought to office by husband, tho he was not with her during this visit. She took premedication decadron as instructed prior to treatment today, with CBG 322 now. She will follow blood sugars at home and is on  lantus q AM and metformin 1000 mg bid; we will additionally cover with regular insulin during treatment here today.   Decadron IV premeds adjusted per conversation with [facility] pharmacist now; I have also spoken directly with infusion RN in this regard.  Note she was seen by dental medicine at [facility] on 10-09-13, but declined treatment now.   ONCOLOGIC HISTORY Patient was initially evaluated by Dr Seymour Bars for ~ 6 months of vaginal spotting. Endometrial biopsy in Feb 2014 found grade 1 endometrial carcinoma, with referral made to Dr Duard Brady. At that time the patient had not seen physician about diabetes in 4 years and was not checking blood sugars at home, tho she did continue to take insulin thru that time (patient states that she "did not need prescription" for the insulin); lack of medical care largely seems due to no insurance at that time. CT AP done in [place] system 02-26-2013, with low attenuation in uterus but otherwise no findings suggesting metastatic disease and adnexa not remarkable. D and C was done and Mirena IUD placed by Dr Nelly Rout on 03-26-2013, with pathology [phone] endometrioid adenocarcinoma with squamous differentiation, grade 1. Patient then established with Dr Everlene Other and diabetes was controlled adequately to allow surgery by 09-17-13, this robotic TAH, BSO and bilateral  pelvic node dissection by Dr Nelly Rout. 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 Post operative course was uncomplicated. She had post operative visit with Dr Nelly Rout with discussion of the pathology and recommendatiion for 6 cycles of taxol carboplatin particularly as omentectomy and periaortic nodes were not included in the surgical staging.   Review of systems as above, also: She was able to sleep last pm. No nausea. Bowels are moving. No new or different pain. No fever or symptoms of infection Remainder of 10 point Review of Systems negative.  Objective:  Vital signs in last 24 hours:  BP 132/71  Pulse 98  Temp(Src) 98.3 F (36.8 C) (Oral)  Resp 18  Ht 5\' 3"  (1.6 m)  Wt 158 lb 3.2 oz (71.759 kg)  BMI 28.03 kg/m2  Alert, oriented and appropriate, does not seem quite as anxious today. Ambulatory without difficulty.  No alopecia  HEENT:PERRL, sclerae not icteric. Oral mucosa moist without lesions, posterior pharynx clear.  Neck supple. No JVD.  Lymphatics:no adenopathy Resp: clear to auscultation bilaterally and normal percussion bilaterally Cardio: regular rate and rhythm. No gallop. GI: soft, nontender, not distended, no mass or organomegaly. Normally active bowel sounds. Robotic surgical incisions closed and not tender. Musculoskeletal/ Extremities: without pitting edema, cords, tenderness Neuro:  Nonfocal, Psych as above Skin without rash, ecchymosis, petechiae No central catheter  Lab  Results:  Results for orders placed in visit on 10/16/13  WHOLE BLOOD GLUCOSE      Result Value Range   Glucose 322 (*) 70 - 100 mg/dL   HRS PC 0.5      CBG to be repeated prior to DC from infusion after chemo also.  Studies/Results:  No results found.  Medications: I have reviewed the patient's current medications.  DISCUSSION: Patient appears stable to begin chemotherapy and has had questions answered to her satisfaction. She knows how to contact this office if needed prior to scheduled return appointment. She knows to push unsweetened fluids and to follow blood sugars at home, and she is to call if blood sugars still >300 by AM of 10-17-13. We have reviewed premedication steroids for treatment next week also  Assessment/Plan: 1. 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 Will begin adjuvant taxol carboplatin using weekly dose dense schedule particularly in hopes of better tolerance. Day 1 cycle 1 today (10-16-13) and close MD follow up as she begins.  2.stage 1 grade 1 endometrial carcinoma: treated surgically, no adjuvant chemo or RT recommended  3.diabetes x 20 years: better controlled presently, but has not been managed closely for years until past several months. Next appointment with Dr Everlene Other early Nov. Expect elevated blood sugars with premedication steroids for taxol, so will follow CBGs at this office with Rx and cover with SS regular insulin + additional IVF as appropriate.  4.diabetic (?) retinal problems: referral back to Dr Stephannie Li  5.preexisting peripheral neuropathy left toes: will follow on taxol  6.dental concerns: seen by [facility], patient declined intervention now 7.flu vaccine given  8.anxiety and previous noncompliance, due at least in part to lack of medical insurance        Reece Packer, MD   10/16/2013, 2:07 PM

## 2013-10-16 NOTE — Progress Notes (Signed)
1350 Patient c/o legs being restless and needing to "move". Dr. Darrold Span notified and order received for Ativan 0.5 mg po.    1600 Blood glucose rechecked at end of treatment  267. Patient given 6 units regular insulin per sliding scale.

## 2013-10-16 NOTE — Patient Instructions (Signed)
Call if any questions or concerns, or if blood sugar is >300 by 10-30 AM.  [phone]  Drink LOTS of unsweetened fluids next 24 hours; take your lantus and metformin as scheduled.  Your chemo on  Wednesday 10-23-13  will be ~ 11 AM.  The night before ( Tues 10-22-13) you need to take five of the steroid pills at ~ 11 PM with food.

## 2013-10-16 NOTE — Patient Instructions (Signed)
[facility] Discharge Instructions for Patients Receiving Chemotherapy  Today you received the following chemotherapy agents Taxol/Carboplatin To help prevent nausea and vomiting after your treatment, we encourage you to take your nausea medication as prescribed.  If you develop nausea and vomiting that is not controlled by your nausea medication, call the clinic.   BELOW ARE SYMPTOMS THAT SHOULD BE REPORTED IMMEDIATELY:  *FEVER GREATER THAN 100.5 F  *CHILLS WITH OR WITHOUT FEVER  NAUSEA AND VOMITING THAT IS NOT CONTROLLED WITH YOUR NAUSEA MEDICATION  *UNUSUAL SHORTNESS OF BREATH  *UNUSUAL BRUISING OR BLEEDING  TENDERNESS IN MOUTH AND THROAT WITH OR WITHOUT PRESENCE OF ULCERS  *URINARY PROBLEMS  *BOWEL PROBLEMS  UNUSUAL RASH Items with * indicate a potential emergency and should be followed up as soon as possible.  Feel free to call the clinic you have any questions or concerns. The clinic phone number is [phone].    

## 2013-10-17 ENCOUNTER — Telehealth: Payer: Self-pay | Admitting: *Deleted

## 2013-10-17 NOTE — Telephone Encounter (Signed)
Spoke with pt at home for post chemo follow up call.  Pt stated good appetite and drinking lots of fluids as tolerated.  Denied nausea/vomiting, stated bowel and bladder function fine. Pt understood to call office with any new problems.  Pt aware of next return appt  10/23/13.

## 2013-10-23 ENCOUNTER — Ambulatory Visit (HOSPITAL_BASED_OUTPATIENT_CLINIC_OR_DEPARTMENT_OTHER): Payer: BC Managed Care – PPO | Admitting: Oncology

## 2013-10-23 ENCOUNTER — Telehealth: Payer: Self-pay | Admitting: Oncology

## 2013-10-23 ENCOUNTER — Telehealth: Payer: Self-pay | Admitting: *Deleted

## 2013-10-23 ENCOUNTER — Other Ambulatory Visit (HOSPITAL_BASED_OUTPATIENT_CLINIC_OR_DEPARTMENT_OTHER): Payer: BC Managed Care – PPO | Admitting: Lab

## 2013-10-23 ENCOUNTER — Encounter: Payer: Self-pay | Admitting: Oncology

## 2013-10-23 ENCOUNTER — Ambulatory Visit (HOSPITAL_BASED_OUTPATIENT_CLINIC_OR_DEPARTMENT_OTHER): Payer: BC Managed Care – PPO

## 2013-10-23 VITALS — BP 137/79 | HR 101 | Temp 97.7°F | Resp 20 | Ht 63.0 in | Wt 154.7 lb

## 2013-10-23 DIAGNOSIS — E119 Type 2 diabetes mellitus without complications: Secondary | ICD-10-CM

## 2013-10-23 DIAGNOSIS — C5702 Malignant neoplasm of left fallopian tube: Secondary | ICD-10-CM

## 2013-10-23 DIAGNOSIS — C5700 Malignant neoplasm of unspecified fallopian tube: Secondary | ICD-10-CM

## 2013-10-23 DIAGNOSIS — Z5111 Encounter for antineoplastic chemotherapy: Secondary | ICD-10-CM

## 2013-10-23 DIAGNOSIS — G609 Hereditary and idiopathic neuropathy, unspecified: Secondary | ICD-10-CM

## 2013-10-23 LAB — CBC WITH DIFFERENTIAL/PLATELET
BASO%: 0.2 % (ref 0.0–2.0)
Basophils Absolute: 0 10*3/uL (ref 0.0–0.1)
EOS%: 0 % (ref 0.0–7.0)
HGB: 10.9 g/dL — ABNORMAL LOW (ref 11.6–15.9)
MCH: 28 pg (ref 25.1–34.0)
MCV: 88.2 fL (ref 79.5–101.0)
MONO%: 1 % (ref 0.0–14.0)
Platelets: 179 10*3/uL (ref 145–400)
RBC: 3.89 10*6/uL (ref 3.70–5.45)
RDW: 14.1 % (ref 11.2–14.5)
lymph#: 0.9 10*3/uL (ref 0.9–3.3)

## 2013-10-23 LAB — WHOLE BLOOD GLUCOSE
Glucose: 264 mg/dL — ABNORMAL HIGH (ref 70–100)
HRS PC: 1.5 Hours

## 2013-10-23 MED ORDER — FAMOTIDINE IN NACL 20-0.9 MG/50ML-% IV SOLN
INTRAVENOUS | Status: AC
  Filled 2013-10-23: qty 50

## 2013-10-23 MED ORDER — PACLITAXEL CHEMO INJECTION 300 MG/50ML: 80.0000 mg/m2 | Freq: Once | INTRAVENOUS | Status: DC

## 2013-10-23 MED ORDER — SODIUM CHLORIDE 0.9 % IV SOLN
Freq: Once | INTRAVENOUS | Status: AC
  Administered 2013-10-23: 11:00:00 via INTRAVENOUS

## 2013-10-23 MED ORDER — ONDANSETRON 8 MG/50ML IVPB (CHCC)
8.0000 mg | Freq: Once | INTRAVENOUS | Status: AC
  Administered 2013-10-23: 8 mg via INTRAVENOUS

## 2013-10-23 MED ORDER — ONDANSETRON 8 MG/NS 50 ML IVPB
INTRAVENOUS | Status: AC
  Filled 2013-10-23: qty 8

## 2013-10-23 MED ORDER — DIPHENHYDRAMINE HCL 50 MG/ML IJ SOLN
INTRAMUSCULAR | Status: AC
  Filled 2013-10-23: qty 1

## 2013-10-23 MED ORDER — DEXAMETHASONE SODIUM PHOSPHATE 20 MG/5ML IJ SOLN
INTRAMUSCULAR | Status: AC
  Filled 2013-10-23: qty 5

## 2013-10-23 MED ORDER — FAMOTIDINE IN NACL 20-0.9 MG/50ML-% IV SOLN
20.0000 mg | Freq: Once | INTRAVENOUS | Status: AC
  Administered 2013-10-23: 20 mg via INTRAVENOUS

## 2013-10-23 MED ORDER — DEXAMETHASONE SODIUM PHOSPHATE 20 MG/5ML IJ SOLN
20.0000 mg | Freq: Once | INTRAMUSCULAR | Status: AC
  Administered 2013-10-23: 20 mg via INTRAVENOUS

## 2013-10-23 MED ORDER — DIPHENHYDRAMINE HCL 50 MG/ML IJ SOLN
25.0000 mg | Freq: Once | INTRAMUSCULAR | Status: AC
  Administered 2013-10-23: 25 mg via INTRAVENOUS

## 2013-10-23 MED ORDER — SODIUM CHLORIDE 0.9 % IV SOLN
80.0000 mg/m2 | Freq: Once | INTRAVENOUS | Status: AC
  Administered 2013-10-23: 144 mg via INTRAVENOUS
  Filled 2013-10-23: qty 24

## 2013-10-23 NOTE — Telephone Encounter (Signed)
, °

## 2013-10-23 NOTE — Patient Instructions (Signed)
Call if you need anything before next visit    [phone]  Take decadron (dexamethasone, steroid) five tablets with food 12 hours before chemo each time

## 2013-10-23 NOTE — Patient Instructions (Signed)
[facility] Discharge Instructions for Patients Receiving Chemotherapy  Today you received the following chemotherapy agents TAXOL To help prevent nausea and vomiting after your treatment, we encourage you to take your nausea medication  If you develop nausea and vomiting that is not controlled by your nausea medication, call the clinic.   BELOW ARE SYMPTOMS THAT SHOULD BE REPORTED IMMEDIATELY:  *FEVER GREATER THAN 100.5 F  *CHILLS WITH OR WITHOUT FEVER  NAUSEA AND VOMITING THAT IS NOT CONTROLLED WITH YOUR NAUSEA MEDICATION  *UNUSUAL SHORTNESS OF BREATH  *UNUSUAL BRUISING OR BLEEDING  TENDERNESS IN MOUTH AND THROAT WITH OR WITHOUT PRESENCE OF ULCERS  *URINARY PROBLEMS  *BOWEL PROBLEMS  UNUSUAL RASH Items with * indicate a potential emergency and should be followed up as soon as possible.  Feel free to call the clinic you have any questions or concerns. The clinic phone number is [phone].

## 2013-10-23 NOTE — Telephone Encounter (Signed)
Per staff message and POF I have scheduled appts. Advised scheduler that 2pm on 11/18 was first available appt JMW

## 2013-10-23 NOTE — Progress Notes (Signed)
OFFICE PROGRESS NOTE   10/23/2013   Physicians:Wendy Brewster, ML Lavoie, D.Elmarie Mainland (ophth), K.Heckler   INTERVAL HISTORY:  Patient is seen, alone for visit, in continuing attention to adjuvant chemotherapy begun 10-16-13 for stage 1 low grade fallopian carcinoma. The fallopian cancer was an unexpected finding on surgical path and full node evaluation was not done at the procedure for what was thought to be a grade 1 stage 1 endometrial cancer only. First chemotherapy treatment was complicated by severe restless legs with benadryl 50 mg premedication, but no taxol reaction.  Benadryl has been decreased to 25 mg po or IV for remainder of treatments. She had no nausea, no taste change, no significant fatigue and bowels have moved adequately. Blood sugar was 101 before premed decadron last pm. No increase in preexisting peripheral neuropathy primarily in left toes. She does not have PAC.  Patient's husband works [employer] representative, out of town this week with job. Friend came with her today and will stay with her this week.  She saw PCP Dr Everlene Other this past week (with mammograms and flu/pneumonia vaccines) and will see him again in Jan 2015.   ONCOLOGIC HISTORY Patient presented to Dr Seymour Bars with ~ 6 months of vaginal spotting. Endometrial biopsy in Feb 2014 found grade 1 endometrial carcinoma, with referral made to Dr Duard Brady. At that time the patient had not seen physician for diabetes in 4 years and was not checking blood sugars at home, tho she did continue to take insulin (patient states that she "did not need prescription" for the insulin); lack of medical care largely seems due to no insurance at that time. CT AP  in [place] system 02-26-2013 showed low attenuation in uterus but otherwise no findings suggesting metastatic disease and adnexa not remarkable. D and C was done and Mirena IUD placed by Dr Nelly Rout on 03-26-2013, with pathology [id number] endometrioid adenocarcinoma with  squamous differentiation, grade 1. Patient then established with Dr Everlene Other and diabetes was controlled adequately to allow surgery by 09-17-13, this robotic TAH, BSO and bilateral pelvic node dissection by Dr Nelly Rout. 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 Post operative course was uncomplicated. She had post operative visit with Dr Nelly Rout with discussion of the pathology and recommendatiion for 6 cycles of taxol carboplatin particularly as omentectomy and periaortic nodes were not included in the surgical staging   Review of systems as above, also: No fever or symptoms of infection. Peripheral IV access was somewhat difficult with first treatment; we have discussed good oral hydration prior to chemo. No bleeding. No LE swelling. No pain. Bladder ok Remainder of 10 point Review of Systems negative.  Objective:  Vital signs in last 24 hours:  BP 137/79  Pulse 101  Temp(Src) 97.7 F (36.5 C) (Oral)  Resp 20  Ht 5\' 3"  (1.6 m)  Wt 154 lb 11.2 oz (70.171 kg)  BMI 27.41 kg/m2 Weight down 4 lbs. She is a little more talkative and seems a little more relaxed today. Alert, oriented and appropriate. Ambulatory without difficulty.    HEENT:PERRL, sclerae not icteric. Oral mucosa moist without lesions, posterior pharynx clear. Hair thin. Neck supple. No JVD.  Lymphatics:no cervical,suraclavicular, axillary or inguinal adenopathy Resp: clear to auscultation bilaterally and normal percussion bilaterally  Cardio: regular rate and rhythm. No gallop. GI: soft, nontender, not distended, no mass or organomegaly. Normally active bowel sounds. Surgical incision not  remarkable. Musculoskeletal/ Extremities: without pitting edema, cords, tenderness.Back not tender Neuro: no peripheral neuropathy. Otherwise nonfocal Skin without rash, ecchymosis, petechiae. IV site from last week without cords or tenderness   Lab Results:  Results for orders placed in visit on 10/23/13  CBC WITH DIFFERENTIAL      Result Value Range   WBC 5.1  3.9 - 10.3 10e3/uL   NEUT# 4.1  1.5 - 6.5 10e3/uL   HGB 10.9 (*) 11.6 - 15.9 g/dL   HCT 16.1 (*) 09.6 - 04.5 %   Platelets 179  145 - 400 10e3/uL   MCV 88.2  79.5 - 101.0 fL   MCH 28.0  25.1 - 34.0 pg   MCHC 31.8  31.5 - 36.0 g/dL   RBC 4.09  8.11 - 9.14 10e6/uL   RDW 14.1  11.2 - 14.5 %   lymph# 0.9  0.9 - 3.3 10e3/uL   MONO# 0.1  0.1 - 0.9 10e3/uL   Eosinophils Absolute 0.0  0.0 - 0.5 10e3/uL   Basophils Absolute 0.0  0.0 - 0.1 10e3/uL   NEUT% 81.3 (*) 38.4 - 76.8 %   LYMPH% 17.5  14.0 - 49.7 %   MONO% 1.0  0.0 - 14.0 %   EOS% 0.0  0.0 - 7.0 %   BASO% 0.2  0.0 - 2.0 %  WHOLE BLOOD GLUCOSE      Result Value Range   Glucose 264 (*) 70 - 100 mg/dL   HRS PC 1.5       Studies/Results:  She had mammograms in [place] by PCP this past week, has not heard from those but will let me know about them.   Medications: I have reviewed the patient's current medications. Decrease in benadryl premed as above. She had flu vaccine and pneumonia vaccine by PCP this past week. She will be covered with SS insulin with chemo today.  DISCUSSION: we have discussed follow up of the cancer situation with exams and labs during chemotherapy, then expect scans after this treatment is completed.We have again discussed rationale for this adjuvant chemotherapy, as there is fortunately no measurable residual or metastatic disease to follow now.  Assessment/Plan:  1. 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  Adjuvant taxol  carboplatin using weekly dose dense schedule begun 10-16-13, particularly in hopes of better tolerance. Day 8 cycle 1 today. I will see her with day 15 cycle 1 on 11-02-13. She is to see Dr Nelly Rout on 11-05-13. 2.stage 1 grade 1 endometrial carcinoma: treated surgically, no adjuvant chemo or RT recommended  3.diabetes x 20 years: better controlled presently, but has not been managed closely for years until past several months. Next appointment with Dr Everlene Other in January. Follow CBGs at this office with Rx due to premed steroids, cover with SS regular insulin + additional IVF as appropriate.  4.diabetic retinal problems: referral back to Dr Stephannie Li  5.preexisting peripheral neuropathy left toes likely also diabetic:  follow on taxol  6.dental concerns: seen by [facility], patient declined intervention now  7.flu vaccine given  8. previous noncompliance: seems to have been due primarily to lack of medical insurance. Appreciate Dr Bonney Leitz assistance last several months. 9. Marked anxiety at my initial visits, seems to be lessening as she becomes more  familiar with treatment etc. 10. Restlessness to benadryl: premed dose decreased 11.peripheral IV access somewhat difficult last week: follow for now, increase hydration  Patient followed discussion and is in agreement with plan    Reece Packer, MD   10/23/2013, 9:06 PM

## 2013-10-24 ENCOUNTER — Telehealth: Payer: Self-pay | Admitting: Oncology

## 2013-10-24 NOTE — Telephone Encounter (Signed)
, °

## 2013-10-26 ENCOUNTER — Other Ambulatory Visit: Payer: Self-pay | Admitting: Oncology

## 2013-10-28 ENCOUNTER — Encounter: Payer: Self-pay | Admitting: Oncology

## 2013-10-28 NOTE — Progress Notes (Signed)
I printed out episode for her and she will pick up when she comes in for visit. She will call billing for bill copies.

## 2013-10-30 ENCOUNTER — Other Ambulatory Visit (HOSPITAL_BASED_OUTPATIENT_CLINIC_OR_DEPARTMENT_OTHER): Payer: BC Managed Care – PPO | Admitting: Lab

## 2013-10-30 ENCOUNTER — Ambulatory Visit (HOSPITAL_BASED_OUTPATIENT_CLINIC_OR_DEPARTMENT_OTHER): Payer: BC Managed Care – PPO | Admitting: Oncology

## 2013-10-30 ENCOUNTER — Encounter: Payer: Self-pay | Admitting: Oncology

## 2013-10-30 ENCOUNTER — Ambulatory Visit (HOSPITAL_BASED_OUTPATIENT_CLINIC_OR_DEPARTMENT_OTHER): Payer: BC Managed Care – PPO

## 2013-10-30 VITALS — BP 146/75 | HR 97 | Temp 98.0°F | Resp 20 | Ht 63.0 in | Wt 157.5 lb

## 2013-10-30 DIAGNOSIS — C5702 Malignant neoplasm of left fallopian tube: Secondary | ICD-10-CM

## 2013-10-30 DIAGNOSIS — E119 Type 2 diabetes mellitus without complications: Secondary | ICD-10-CM

## 2013-10-30 DIAGNOSIS — H33309 Unspecified retinal break, unspecified eye: Secondary | ICD-10-CM

## 2013-10-30 DIAGNOSIS — C5700 Malignant neoplasm of unspecified fallopian tube: Secondary | ICD-10-CM

## 2013-10-30 DIAGNOSIS — C549 Malignant neoplasm of corpus uteri, unspecified: Secondary | ICD-10-CM

## 2013-10-30 DIAGNOSIS — Z5111 Encounter for antineoplastic chemotherapy: Secondary | ICD-10-CM

## 2013-10-30 LAB — CBC WITH DIFFERENTIAL/PLATELET
BASO%: 0.1 % (ref 0.0–2.0)
Basophils Absolute: 0 10*3/uL (ref 0.0–0.1)
EOS%: 0 % (ref 0.0–7.0)
HCT: 31.9 % — ABNORMAL LOW (ref 34.8–46.6)
HGB: 10.1 g/dL — ABNORMAL LOW (ref 11.6–15.9)
MCH: 27.9 pg (ref 25.1–34.0)
MCHC: 31.7 g/dL (ref 31.5–36.0)
MCV: 88.1 fL (ref 79.5–101.0)
MONO%: 0.9 % (ref 0.0–14.0)
NEUT%: 83.3 % — ABNORMAL HIGH (ref 38.4–76.8)
RBC: 3.62 10*6/uL — ABNORMAL LOW (ref 3.70–5.45)
lymph#: 1.1 10*3/uL (ref 0.9–3.3)
nRBC: 0 % (ref 0–0)

## 2013-10-30 LAB — COMPREHENSIVE METABOLIC PANEL (CC13)
Albumin: 3.8 g/dL (ref 3.5–5.0)
Alkaline Phosphatase: 83 U/L (ref 40–150)
Anion Gap: 13 mEq/L — ABNORMAL HIGH (ref 3–11)
BUN: 18.4 mg/dL (ref 7.0–26.0)
Calcium: 9.8 mg/dL (ref 8.4–10.4)
Chloride: 110 mEq/L — ABNORMAL HIGH (ref 98–109)
Glucose: 386 mg/dl — ABNORMAL HIGH (ref 70–140)
Potassium: 4.7 mEq/L (ref 3.5–5.1)
Total Bilirubin: 0.2 mg/dL (ref 0.20–1.20)

## 2013-10-30 LAB — WHOLE BLOOD GLUCOSE
Glucose: 296 mg/dL — ABNORMAL HIGH (ref 70–100)
Glucose: 296 mg/dL — ABNORMAL HIGH (ref 70–100)

## 2013-10-30 MED ORDER — ONDANSETRON 8 MG/50ML IVPB (CHCC)
8.0000 mg | Freq: Once | INTRAVENOUS | Status: AC
  Administered 2013-10-30: 8 mg via INTRAVENOUS

## 2013-10-30 MED ORDER — DEXAMETHASONE SODIUM PHOSPHATE 20 MG/5ML IJ SOLN
20.0000 mg | Freq: Once | INTRAMUSCULAR | Status: AC
  Administered 2013-10-30: 20 mg via INTRAVENOUS

## 2013-10-30 MED ORDER — DEXAMETHASONE SODIUM PHOSPHATE 20 MG/5ML IJ SOLN
INTRAMUSCULAR | Status: AC
  Filled 2013-10-30: qty 5

## 2013-10-30 MED ORDER — FAMOTIDINE IN NACL 20-0.9 MG/50ML-% IV SOLN
INTRAVENOUS | Status: AC
  Filled 2013-10-30: qty 50

## 2013-10-30 MED ORDER — SODIUM CHLORIDE 0.9 % IV SOLN
80.0000 mg/m2 | Freq: Once | INTRAVENOUS | Status: AC
  Administered 2013-10-30: 144 mg via INTRAVENOUS
  Filled 2013-10-30: qty 24

## 2013-10-30 MED ORDER — ONDANSETRON 8 MG/NS 50 ML IVPB
INTRAVENOUS | Status: AC
  Filled 2013-10-30: qty 8

## 2013-10-30 MED ORDER — FAMOTIDINE IN NACL 20-0.9 MG/50ML-% IV SOLN
20.0000 mg | Freq: Once | INTRAVENOUS | Status: AC
  Administered 2013-10-30: 20 mg via INTRAVENOUS

## 2013-10-30 MED ORDER — DIPHENHYDRAMINE HCL 50 MG/ML IJ SOLN
25.0000 mg | Freq: Once | INTRAMUSCULAR | Status: AC
  Administered 2013-10-30: 25 mg via INTRAVENOUS

## 2013-10-30 MED ORDER — SODIUM CHLORIDE 0.9 % IV SOLN
Freq: Once | INTRAVENOUS | Status: AC
  Administered 2013-10-30: 11:00:00 via INTRAVENOUS

## 2013-10-30 MED ORDER — INSULIN REGULAR HUMAN 100 UNIT/ML IJ SOLN
6.0000 [IU] | INTRAMUSCULAR | Status: DC
  Administered 2013-10-30 (×2): 6 [IU] via SUBCUTANEOUS
  Filled 2013-10-30: qty 0.06

## 2013-10-30 MED ORDER — DIPHENHYDRAMINE HCL 50 MG/ML IJ SOLN
INTRAMUSCULAR | Status: AC
  Filled 2013-10-30: qty 1

## 2013-10-30 NOTE — Patient Instructions (Addendum)
[facility] Discharge Instructions for Patients Receiving Chemotherapy  Today you received the following chemotherapy agent Taxol.  To help prevent nausea and vomiting after your treatment, we encourage you to take your nausea medication.   If you develop nausea and vomiting that is not controlled by your nausea medication, call the clinic.   BELOW ARE SYMPTOMS THAT SHOULD BE REPORTED IMMEDIATELY:  *FEVER GREATER THAN 100.5 F  *CHILLS WITH OR WITHOUT FEVER  NAUSEA AND VOMITING THAT IS NOT CONTROLLED WITH YOUR NAUSEA MEDICATION  *UNUSUAL SHORTNESS OF BREATH  *UNUSUAL BRUISING OR BLEEDING  TENDERNESS IN MOUTH AND THROAT WITH OR WITHOUT PRESENCE OF ULCERS  *URINARY PROBLEMS  *BOWEL PROBLEMS  UNUSUAL RASH Items with * indicate a potential emergency and should be followed up as soon as possible.  Feel free to call the clinic you have any questions or concerns. The clinic phone number is [phone].    Paclitaxel injection What is this medicine? PACLITAXEL (PAK li TAX el) is a chemotherapy drug. It targets fast dividing cells, like cancer cells, and causes these cells to die. This medicine is used to treat ovarian cancer, breast cancer, and other cancers. This medicine may be used for other purposes; ask your health care provider or pharmacist if you have questions. COMMON BRAND NAME(S): Onxol , Taxol What should I tell my health care provider before I take this medicine? They need to know if you have any of these conditions: -blood disorders -irregular heartbeat -infection (especially a virus infection such as chickenpox, cold sores, or herpes) -liver disease -previous or ongoing radiation therapy -an unusual or allergic reaction to paclitaxel, alcohol, polyoxyethylated castor oil, other chemotherapy agents, other medicines, foods, dyes, or preservatives -pregnant or trying to get pregnant -breast-feeding How should I use this medicine? This drug is  given as an infusion into a vein. It is administered in a hospital or clinic by a specially trained health care professional. Talk to your pediatrician regarding the use of this medicine in children. Special care may be needed. Overdosage: If you think you have taken too much of this medicine contact a poison control center or emergency room at once. NOTE: This medicine is only for you. Do not share this medicine with others. What if I miss a dose? It is important not to miss your dose. Call your doctor or health care professional if you are unable to keep an appointment. What may interact with this medicine? Do not take this medicine with any of the following medications: -disulfiram -metronidazole This medicine may also interact with the following medications: -cyclosporine -diazepam -ketoconazole -medicines to increase blood counts like filgrastim, pegfilgrastim, sargramostim -other chemotherapy drugs like cisplatin, doxorubicin, epirubicin, etoposide, teniposide, vincristine -quinidine -testosterone -vaccines -verapamil Talk to your doctor or health care professional before taking any of these medicines: -acetaminophen -aspirin -ibuprofen -ketoprofen -naproxen This list may not describe all possible interactions. Give your health care provider a list of all the medicines, herbs, non-prescription drugs, or dietary supplements you use. Also tell them if you smoke, drink alcohol, or use illegal drugs. Some items may interact with your medicine. What should I watch for while using this medicine? Your condition will be monitored carefully while you are receiving this medicine. You will need important blood work done while you are taking this medicine. This drug may make you feel generally unwell. This is not uncommon, as chemotherapy can affect healthy cells as well as cancer cells. Report any side effects. Continue your course of treatment  even though you feel ill unless your doctor  tells you to stop. In some cases, you may be given additional medicines to help with side effects. Follow all directions for their use. Call your doctor or health care professional for advice if you get a fever, chills or sore throat, or other symptoms of a cold or flu. Do not treat yourself. This drug decreases your body's ability to fight infections. Try to avoid being around people who are sick. This medicine may increase your risk to bruise or bleed. Call your doctor or health care professional if you notice any unusual bleeding. Be careful brushing and flossing your teeth or using a toothpick because you may get an infection or bleed more easily. If you have any dental work done, tell your dentist you are receiving this medicine. Avoid taking products that contain aspirin, acetaminophen, ibuprofen, naproxen, or ketoprofen unless instructed by your doctor. These medicines may hide a fever. Do not become pregnant while taking this medicine. Women should inform their doctor if they wish to become pregnant or think they might be pregnant. There is a potential for serious side effects to an unborn child. Talk to your health care professional or pharmacist for more information. Do not breast-feed an infant while taking this medicine. Men are advised not to father a child while receiving this medicine. What side effects may I notice from receiving this medicine? Side effects that you should report to your doctor or health care professional as soon as possible: -allergic reactions like skin rash, itching or hives, swelling of the face, lips, or tongue -low blood counts - This drug may decrease the number of white blood cells, red blood cells and platelets. You may be at increased risk for infections and bleeding. -signs of infection - fever or chills, cough, sore throat, pain or difficulty passing urine -signs of decreased platelets or bleeding - bruising, pinpoint red spots on the skin, black, tarry  stools, nosebleeds -signs of decreased red blood cells - unusually weak or tired, fainting spells, lightheadedness -breathing problems -chest pain -high or low blood pressure -mouth sores -nausea and vomiting -pain, swelling, redness or irritation at the injection site -pain, tingling, numbness in the hands or feet -slow or irregular heartbeat -swelling of the ankle, feet, hands Side effects that usually do not require medical attention (report to your doctor or health care professional if they continue or are bothersome): -bone pain -complete hair loss including hair on your head, underarms, pubic hair, eyebrows, and eyelashes -changes in the color of fingernails -diarrhea -loosening of the fingernails -loss of appetite -muscle or joint pain -red flush to skin -sweating This list may not describe all possible side effects. Call your doctor for medical advice about side effects. You may report side effects to FDA at 1-800-FDA-1088. Where should I keep my medicine? This drug is given in a hospital or clinic and will not be stored at home. NOTE: This sheet is a summary. It may not cover all possible information. If you have questions about this medicine, talk to your doctor, pharmacist, or health care provider.  2014, [employer]vier/[employer] Standard. (2013-01-28 16:41:21)

## 2013-10-30 NOTE — Progress Notes (Signed)
OFFICE PROGRESS NOTE   10/30/2013   Physicians:Wendy Brewster, ML Lavoie, D.Elmarie Mainland (ophth), K.Heckler   INTERVAL HISTORY:   Patient is seen,brought to office by husband but alone for visit, in continuing close attention to adjuvant chemotherapy for incompletely staged fallopian carcinoma, due day 15 cycle 1 chemotherapy today. She has felt very well for past 2 weeks, just a little fatigue when she pushes activities. She has had no nausea, no change in peripheral neuropathy soles of feet that was present likely from DM prior to start of chemo. Blood sugars around chemo are high 200s, otherwise ~ 108 including yesterday. Bowels are moving regularly and she has no abdominal or pelvic pain. She is to see Dr Nelly Rout on 11-18 with labs and will have Day 1 cycle 2 that day (to save another trip to office for rx on 11-19) as long as ANC >=1.5 and plt >=100k and if chemistries pending today ok for that day 1 carboplatin. She has not needed gCSF. Peripheral IV access has been adequate for treatment.  She would like to go to [place] with family for a few days at Christmas, which we will accommodate with treatments if possible. They will be driving.  ONCOLOGIC HISTORY Patient presented to Dr Seymour Bars with ~ 6 months of vaginal spotting. Endometrial biopsy in Feb 2014 found grade 1 endometrial carcinoma, with referral made to Dr Duard Brady. At that time the patient had not seen physician for diabetes in 4 years and was not checking blood sugars at home, tho she did continue to take insulin (patient states that she "did not need prescription" for the insulin); lack of medical care largely seems due to no insurance at that time. CT AP in [place] system 02-26-2013 showed low attenuation in uterus but otherwise no findings suggesting metastatic disease and adnexa not remarkable. D and C was done and Mirena IUD placed by Dr Nelly Rout on 03-26-2013, with pathology [phone] endometrioid adenocarcinoma with  squamous differentiation, grade 1. Patient then established with Dr Everlene Other and diabetes was controlled adequately to allow surgery by 09-17-13, this robotic TAH, BSO and bilateral pelvic node dissection by Dr Nelly Rout. 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 Post operative course was uncomplicated. Dr Nelly Rout recommended 6 cycles of taxol carboplatin particularly as omentectomy and periaortic nodes were not included in the surgical staging; dose dense regimen was chosen due to diabetic neuropathy and tolerance concerns. Cycle 1 day 1 was given 10-16-13.   Review of systems as above, also: No taste change, no vomiting, eating good regular diet. Bowels moving regularly. No SOB or cough. No pain. Energy good from steroids day of Rx but also does not feel excessively fatigued afterwards. No taxol aches. No bleeding. No LE swelling Remainder of 10 point Review of Systems negative.  Objective:  Vital signs in last 24 hours:  BP 146/75  Pulse 97  Temp(Src) 98 F (36.7 C) (Oral)  Resp 20  Ht 5\' 3"  (1.6 m)  Wt 157 lb 8 oz (71.442 kg)  BMI 27.91 kg/m2 Weight is up 3 lbs. Alert, oriented and appropriate; she appears more relaxed overall than when I met her initially. Ambulatory without difficulty.  She does not have complete alopecia  HEENT:PERRL, sclerae not icteric. Oral mucosa  moist without lesions, posterior pharynx clear.  Neck supple. No JVD.  Lymphatics:no cervical,suraclavicular or inguinal adenopathy Resp: clear to auscultation bilaterally and normal percussion bilaterally Cardio: regular rate and rhythm. No gallop. GI: soft, nontender, not distended, no  mass or organomegaly. Normally active bowel sounds. Surgical incisions not remarkable. Musculoskeletal/ Extremities: without pitting edema, cords, tenderness Neuro: decreased sensation soles of feet stable. Psych as above Skin without rash, ecchymosis, petechiae. No problems at site of last IV LUE   Lab Results:  Results for orders placed in visit on 10/30/13  CBC WITH DIFFERENTIAL      Result Value Range   WBC 6.7  3.9 - 10.3 10e3/uL   NEUT# 5.6  1.5 - 6.5 10e3/uL   HGB 10.1 (*) 11.6 - 15.9 g/dL   HCT 86.5 (*) 78.4 - 69.6 %   Platelets 123 (*) 145 - 400 10e3/uL   MCV 88.1  79.5 - 101.0 fL   MCH 27.9  25.1 - 34.0 pg   MCHC 31.7  31.5 - 36.0 g/dL   RBC 2.95 (*) 2.84 - 1.32 10e6/uL   RDW 14.1  11.2 - 14.5 %   lymph# 1.1  0.9 - 3.3 10e3/uL   MONO# 0.1  0.1 - 0.9 10e3/uL   Eosinophils Absolute 0.0  0.0 - 0.5 10e3/uL   Basophils Absolute 0.0  0.0 - 0.1 10e3/uL   NEUT% 83.3 (*) 38.4 - 76.8 %   LYMPH% 15.7  14.0 - 49.7 %   MONO% 0.9  0.0 - 14.0 %   EOS% 0.0  0.0 - 7.0 %   BASO% 0.1  0.0 - 2.0 %   nRBC 0  0 - 0 %  WHOLE BLOOD GLUCOSE      Result Value Range   Glucose 296 (*) 70 - 100 mg/dL   HRS PC 0.5     CMET and CA125 pending today (no prior CA125 for comparison that I find in EMR)  Studies/Results:  No results found.  Medications: I have reviewed the patient's current medications, continuing metformin and lantus. Premedication oral decadron is 20 mg 12 hrs prior to taxol    Assessment/Plan: 1. 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 She will have day 15 cycle 1 today, and will see Dr Nelly Rout next week with day 1 cycle 2. I will see her again with day 8 cycle 2 on 11-13-13. 2.stage 1 grade 1 endometrial carcinoma: treated surgically, no  adjuvant chemo or RT recommended  3.diabetes x 20 years: better controlled presently, but has not been managed closely for years until past several months. Next appointment with Dr Everlene Other in January. Follow CBGs at this office with Rx due to premed steroids, cover with SS regular insulin + additional IVF as appropriate.  4.diabetic retinal problems: referral back to Dr Stephannie Li  5.preexisting peripheral neuropathy left toes likely also diabetic: follow on taxol  6.dental concerns: seen by [facility], patient declined intervention now  7.flu vaccine given  8. previous noncompliance: seems to have been due primarily to lack of medical insurance. Appreciate Dr Bonney Leitz assistance last several months.  9. Marked anxiety at my initial visits, seems to be lessening as she becomes more familiar with treatment etc.  10. Restlessness to benadryl: premed dose decreased  11.peripheral IV access intermittently difficult  Patient is pleased with how well she is doing and in agreement with plan above  Ashby Leflore P, MD   10/30/2013, 9:11 AM

## 2013-10-30 NOTE — Progress Notes (Signed)
1105 Patient's glucose level @ 296, per MD orders in tx plan 6 units Novolin given. Will recheck patient's glucose prior to discharge.  1400 Patient's glucose @ 296 per lab stick prior to d/c, per MD tx  orders 6 units Novolin given.

## 2013-10-31 LAB — CA 125: CA 125: 6.1 U/mL (ref 0.0–30.2)

## 2013-11-01 ENCOUNTER — Other Ambulatory Visit: Payer: Self-pay | Admitting: *Deleted

## 2013-11-01 DIAGNOSIS — C5700 Malignant neoplasm of unspecified fallopian tube: Secondary | ICD-10-CM

## 2013-11-01 DIAGNOSIS — C541 Malignant neoplasm of endometrium: Secondary | ICD-10-CM

## 2013-11-01 MED ORDER — DEXAMETHASONE 4 MG PO TABS: ORAL_TABLET | ORAL | Status: DC

## 2013-11-03 ENCOUNTER — Other Ambulatory Visit: Payer: Self-pay | Admitting: Oncology

## 2013-11-03 DIAGNOSIS — C5702 Malignant neoplasm of left fallopian tube: Secondary | ICD-10-CM

## 2013-11-04 ENCOUNTER — Telehealth: Payer: Self-pay

## 2013-11-04 NOTE — Telephone Encounter (Signed)
Spoke with Makayla Buchanan this evening and discussed the kidney function and deed to push fluids ans noted below by Dr. Darrold Span. Suggested 5 (8oz) glasses of water tonight and 5 more in the am prior to visit.  Pt. Verbalized understanding.

## 2013-11-04 NOTE — Telephone Encounter (Signed)
          Message Received: 1 day ago     Reece Packer, MD Lorine Bears, RN; Phillis Knack, RN            To see Dr Nelly Rout, have labs and have day 1 cycle 2 carbo taxol all on Tues 11-18. I have added stat BMET to labs and written in chemo orders to page LL with results prior to starting [place], as creatinine higher last week and may need further adjustment.  Please let patient know she should push unsweetened fluids11-17 and morning 11-18.   Thanks  Cc LA, TH

## 2013-11-05 ENCOUNTER — Ambulatory Visit: Payer: BC Managed Care – PPO | Attending: Gynecologic Oncology | Admitting: Gynecologic Oncology

## 2013-11-05 ENCOUNTER — Ambulatory Visit: Payer: BC Managed Care – PPO | Admitting: Gynecologic Oncology

## 2013-11-05 ENCOUNTER — Encounter: Payer: Self-pay | Admitting: Gynecologic Oncology

## 2013-11-05 ENCOUNTER — Other Ambulatory Visit (HOSPITAL_BASED_OUTPATIENT_CLINIC_OR_DEPARTMENT_OTHER): Payer: BC Managed Care – PPO | Admitting: Lab

## 2013-11-05 ENCOUNTER — Other Ambulatory Visit: Payer: Self-pay | Admitting: Lab

## 2013-11-05 ENCOUNTER — Other Ambulatory Visit: Payer: Self-pay | Admitting: *Deleted

## 2013-11-05 ENCOUNTER — Other Ambulatory Visit: Payer: Self-pay | Admitting: Oncology

## 2013-11-05 ENCOUNTER — Ambulatory Visit (HOSPITAL_BASED_OUTPATIENT_CLINIC_OR_DEPARTMENT_OTHER): Payer: BC Managed Care – PPO

## 2013-11-05 VITALS — BP 146/66 | HR 104 | Temp 97.9°F | Resp 16 | Ht 63.0 in | Wt 158.8 lb

## 2013-11-05 VITALS — BP 143/79 | HR 96 | Temp 98.0°F

## 2013-11-05 DIAGNOSIS — Z79899 Other long term (current) drug therapy: Secondary | ICD-10-CM | POA: Insufficient documentation

## 2013-11-05 DIAGNOSIS — Z794 Long term (current) use of insulin: Secondary | ICD-10-CM | POA: Insufficient documentation

## 2013-11-05 DIAGNOSIS — Z5111 Encounter for antineoplastic chemotherapy: Secondary | ICD-10-CM

## 2013-11-05 DIAGNOSIS — E785 Hyperlipidemia, unspecified: Secondary | ICD-10-CM | POA: Insufficient documentation

## 2013-11-05 DIAGNOSIS — C5700 Malignant neoplasm of unspecified fallopian tube: Secondary | ICD-10-CM | POA: Insufficient documentation

## 2013-11-05 DIAGNOSIS — Z803 Family history of malignant neoplasm of breast: Secondary | ICD-10-CM | POA: Insufficient documentation

## 2013-11-05 DIAGNOSIS — Z9079 Acquired absence of other genital organ(s): Secondary | ICD-10-CM | POA: Insufficient documentation

## 2013-11-05 DIAGNOSIS — C5702 Malignant neoplasm of left fallopian tube: Secondary | ICD-10-CM

## 2013-11-05 DIAGNOSIS — E119 Type 2 diabetes mellitus without complications: Secondary | ICD-10-CM

## 2013-11-05 DIAGNOSIS — Z90710 Acquired absence of both cervix and uterus: Secondary | ICD-10-CM | POA: Insufficient documentation

## 2013-11-05 DIAGNOSIS — C549 Malignant neoplasm of corpus uteri, unspecified: Secondary | ICD-10-CM | POA: Insufficient documentation

## 2013-11-05 DIAGNOSIS — C541 Malignant neoplasm of endometrium: Secondary | ICD-10-CM

## 2013-11-05 LAB — BASIC METABOLIC PANEL (CC13)
Anion Gap: 12 mEq/L — ABNORMAL HIGH (ref 3–11)
CO2: 24 mEq/L (ref 22–29)
Calcium: 10 mg/dL (ref 8.4–10.4)
Glucose: 215 mg/dl — ABNORMAL HIGH (ref 70–140)
Potassium: 4.3 mEq/L (ref 3.5–5.1)
Sodium: 144 mEq/L (ref 136–145)

## 2013-11-05 LAB — CBC WITH DIFFERENTIAL/PLATELET
Basophils Absolute: 0 10*3/uL (ref 0.0–0.1)
EOS%: 0 % (ref 0.0–7.0)
Eosinophils Absolute: 0 10*3/uL (ref 0.0–0.5)
HCT: 30.4 % — ABNORMAL LOW (ref 34.8–46.6)
HGB: 9.8 g/dL — ABNORMAL LOW (ref 11.6–15.9)
LYMPH%: 17.8 % (ref 14.0–49.7)
MCV: 86.6 fL (ref 79.5–101.0)
MONO%: 1.1 % (ref 0.0–14.0)
NEUT#: 5.3 10*3/uL (ref 1.5–6.5)
NEUT%: 81.1 % — ABNORMAL HIGH (ref 38.4–76.8)
RDW: 14.4 % (ref 11.2–14.5)
lymph#: 1.2 10*3/uL (ref 0.9–3.3)

## 2013-11-05 LAB — WHOLE BLOOD GLUCOSE: Glucose: 230 mg/dL — ABNORMAL HIGH (ref 70–100)

## 2013-11-05 MED ORDER — SODIUM CHLORIDE 0.9 % IV SOLN
80.0000 mg/m2 | Freq: Once | INTRAVENOUS | Status: AC
  Administered 2013-11-05: 144 mg via INTRAVENOUS
  Filled 2013-11-05: qty 24

## 2013-11-05 MED ORDER — DIPHENHYDRAMINE HCL 50 MG/ML IJ SOLN
25.0000 mg | Freq: Once | INTRAMUSCULAR | Status: AC
  Administered 2013-11-05: 25 mg via INTRAVENOUS

## 2013-11-05 MED ORDER — DEXAMETHASONE SODIUM PHOSPHATE 20 MG/5ML IJ SOLN
INTRAMUSCULAR | Status: AC
  Filled 2013-11-05: qty 5

## 2013-11-05 MED ORDER — INSULIN REGULAR HUMAN 100 UNIT/ML IJ SOLN
4.0000 [IU] | INTRAMUSCULAR | Status: DC
  Administered 2013-11-05: 4 [IU] via SUBCUTANEOUS
  Filled 2013-11-05: qty 0.04

## 2013-11-05 MED ORDER — ONDANSETRON 16 MG/50ML IVPB (CHCC)
INTRAVENOUS | Status: AC
  Filled 2013-11-05: qty 16

## 2013-11-05 MED ORDER — FAMOTIDINE IN NACL 20-0.9 MG/50ML-% IV SOLN
INTRAVENOUS | Status: AC
  Filled 2013-11-05: qty 50

## 2013-11-05 MED ORDER — SODIUM CHLORIDE 0.9 % IV SOLN
331.6000 mg | Freq: Once | INTRAVENOUS | Status: AC
  Administered 2013-11-05: 330 mg via INTRAVENOUS
  Filled 2013-11-05: qty 33

## 2013-11-05 MED ORDER — DEXAMETHASONE SODIUM PHOSPHATE 20 MG/5ML IJ SOLN
20.0000 mg | Freq: Once | INTRAMUSCULAR | Status: AC
  Administered 2013-11-05: 20 mg via INTRAVENOUS

## 2013-11-05 MED ORDER — DIPHENHYDRAMINE HCL 50 MG/ML IJ SOLN
INTRAMUSCULAR | Status: AC
  Filled 2013-11-05: qty 1

## 2013-11-05 MED ORDER — ONDANSETRON 16 MG/50ML IVPB (CHCC)
16.0000 mg | Freq: Once | INTRAVENOUS | Status: AC
  Administered 2013-11-05: 16 mg via INTRAVENOUS

## 2013-11-05 MED ORDER — SODIUM CHLORIDE 0.9 % IV SOLN
Freq: Once | INTRAVENOUS | Status: AC
  Administered 2013-11-05: 15:00:00 via INTRAVENOUS

## 2013-11-05 MED ORDER — FAMOTIDINE IN NACL 20-0.9 MG/50ML-% IV SOLN
20.0000 mg | Freq: Once | INTRAVENOUS | Status: AC
  Administered 2013-11-05: 20 mg via INTRAVENOUS

## 2013-11-05 NOTE — Patient Instructions (Signed)
[facility] Discharge Instructions for Patients Receiving Chemotherapy  Today you received the following chemotherapy agents Taxol/Carboplatin To help prevent nausea and vomiting after your treatment, we encourage you to take your nausea medication as prescribed.  If you develop nausea and vomiting that is not controlled by your nausea medication, call the clinic.   BELOW ARE SYMPTOMS THAT SHOULD BE REPORTED IMMEDIATELY:  *FEVER GREATER THAN 100.5 F  *CHILLS WITH OR WITHOUT FEVER  NAUSEA AND VOMITING THAT IS NOT CONTROLLED WITH YOUR NAUSEA MEDICATION  *UNUSUAL SHORTNESS OF BREATH  *UNUSUAL BRUISING OR BLEEDING  TENDERNESS IN MOUTH AND THROAT WITH OR WITHOUT PRESENCE OF ULCERS  *URINARY PROBLEMS  *BOWEL PROBLEMS  UNUSUAL RASH Items with * indicate a potential emergency and should be followed up as soon as possible.  Feel free to call the clinic you have any questions or concerns. The clinic phone number is [phone].    

## 2013-11-05 NOTE — Progress Notes (Signed)
Office Visit:  GYN ONCOLOGY  CC:  Chief Complaint   Patient presents with   .  endometrial cancer   Fallopian tube cancer  Assessment/Plan:  57 y.o. 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  This represents a very early fallopian tube cancer that was only detected on microscopic evaluation. As such omentectomy and periaortic nodes were not assessed at the time of surgery last week.  Has received 1/6 cycles taxol/carboplatin  A referral was made for genetic counselling and testing given the patients synchronous uterine/fallopian tube malignancy and her mother's breast cancer diagnosis.  HPI:  Makayla Buchanan was initially  seen at the request of Dr. Seymour Bars a few months ago  for a newly diagnosed endometrial cancer.  She's been menopausal for essentially 5 years. She never took any hormone replacement therapy. Due to the persistent nature for spotting she went to urgent care and his was referred to Dr. Seymour Bars.  Dr. Seymour Bars performed an endometrial biopsy on February 13 2013. 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  She also had a CT scan of the abdomen and pelvis.   Findings: The lung bases are clear. The liver enhances with no focal abnormality and no ductal dilatation is seen. No calcified gallstones are noted. The pancreas is normal in size although somewhat atrophic. The pancreatic duct is not dilated. The adrenal glands and spleen are unremarkable. The stomach is moderately fluid distended with no abnormality noted. The kidneys enhance with no calculus or mass and no hydronephrosis is seen. On delayed images, the pelvocaliceal systems are unremarkable. The proximal ureters are normal in caliber.  The abdominal aorta appears normal. No adenopathy is noted. Small mesenteric nodes are present none of which are pathologically enlarged. There is low attenuation within the uterus in this patient with recently diagnosed endometrial carcinoma. No pelvic adenopathy is seen. The ovaries are within normal limits in size. No free fluid is seen within the pelvis. The urinary bladder is moderately distended with no abnormality noted. No abnormality of the colon is seen. The terminal ileum is unremarkable. The appendix is visualized filling with air and extending cephalad to the left of midline, with no inflammatory process noted. There is a 4 mm anterolisthesis of L4 on L5 which appears to be due to degenerative change involving the facet joints.  IMPRESSION:  1. Abnormal appearance of the uterus in this patient with recently diagnosed endometrial carcinoma. No evidence of metastatic involvement of the abdomen or pelvis and no adenopathy is seen.  2. 4 mm anterolisthesis of L4 on L5 which appears to be due to degenerative change   RTH BSO LND was planned for 03/2013.  HgbA1C returned 10.6.  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    On September 13 she underwent a robotic-assisted total laparoscopic hysterectomy bilateral salpingo-oophorectomy bilateral pelvic lymph node dissection. Pathology was notable for an invasive endometrioid grade 1 carcinoma invading 9 mm where the myometrium is 19 mm. Tumor size was 1.8 cm. There was no lymphovascular space involvement and 0 of 10 lymph nodes were involved. On evaluation of the distal adnexa the left distal fimbriated end of the fallopian tubes showed a low  grade adenocarcinoma with superficial stromal invasion present.  The morphologic features of the fimbrial tumor were completely different from the endometrial tumor. The satellite instability was not appreciated in the endometrial tumor.  The patient  presents today for post op check.  She is receiving dose dense chemotherapy and has completed cycle 1.  Review of Systems  Did well after cycle 1 chemotherapy.  Denies vaginal bleeding, no abdominal pain, no n/v, no visual changes.  Past Surgical Hx:  Past Surgical History  Procedure Laterality Date  . Uterine fibroid surgery  20 YRS AGO  . Dilation and curettage of uterus N/A 03/26/2013    Procedure: DILATATION AND CURETTAGE with insertion of Mirena IUD;  Surgeon: Laurette Schimke, MD;  Location: [facility];  Service: Gynecology;  Laterality: N/A;  to start at 0745 per nancy. placement of Mirena.  . Robotic assisted total hysterectomy with bilateral salpingo oopherectomy N/A 09/17/2013    Procedure: ROBOTIC ASSISTED TOTAL HYSTERECTOMY WITH BILATERAL SALPINGO OOPHORECTOMY;  Surgeon: Laurette Schimke, MD;  Location: [facility];  Service: Gynecology;  Laterality: N/A;  . Lymph node dissection N/A 09/17/2013    Procedure: LYMPH NODE DISECTION/STAGGING;  Surgeon: Laurette Schimke, MD;  Location: [facility];  Service: Gynecology;  Laterality: N/A;    Past Medical Hx:  Past Medical History  Diagnosis Date  . Postmenopausal bleeding   . Diabetes mellitus without complication   . Hyperlipidemia   . Cancer     endometrial  Left Fallopian tube cancer   Family Hx:  Family History   Problem  Relation  Age of Onset   .  Diabetes  Mother    .  Hypertension  Mother    .  Breast cancer  Mother    .  Diabetes  Father    Vitals:  Physical Exam:  Vitals BP 146/66  Pulse 104  Temp(Src) 97.9 F (36.6 C)  Resp 16  Ht 5\' 3"  (1.6 m)  Wt 158 lb 12.8 oz (72.031 kg)  BMI 28.14 kg/m2 WD female in NAD CHEST:  CTA CARDIAC: RRR ABD:  Soft NT port sites without erythema or hernia Pelvic:  Nl EGBUS, Cuff intact, no blood no cul de sac masses Ext:  No CCE

## 2013-11-05 NOTE — Progress Notes (Signed)
Called creatinine results to Dr. Darrold Span.  Creatinine 0.9  Ok to proceed with dosage of Carboplatin.

## 2013-11-12 ENCOUNTER — Other Ambulatory Visit: Payer: Self-pay | Admitting: Oncology

## 2013-11-13 ENCOUNTER — Telehealth: Payer: Self-pay

## 2013-11-13 ENCOUNTER — Telehealth: Payer: Self-pay | Admitting: *Deleted

## 2013-11-13 ENCOUNTER — Ambulatory Visit (HOSPITAL_BASED_OUTPATIENT_CLINIC_OR_DEPARTMENT_OTHER): Payer: BC Managed Care – PPO | Admitting: Oncology

## 2013-11-13 ENCOUNTER — Encounter: Payer: Self-pay | Admitting: Oncology

## 2013-11-13 ENCOUNTER — Other Ambulatory Visit (HOSPITAL_BASED_OUTPATIENT_CLINIC_OR_DEPARTMENT_OTHER): Payer: BC Managed Care – PPO | Admitting: Lab

## 2013-11-13 ENCOUNTER — Ambulatory Visit (HOSPITAL_BASED_OUTPATIENT_CLINIC_OR_DEPARTMENT_OTHER): Payer: BC Managed Care – PPO

## 2013-11-13 VITALS — BP 147/79 | HR 112 | Temp 98.0°F | Resp 18 | Ht 63.0 in | Wt 158.7 lb

## 2013-11-13 DIAGNOSIS — E119 Type 2 diabetes mellitus without complications: Secondary | ICD-10-CM

## 2013-11-13 DIAGNOSIS — C549 Malignant neoplasm of corpus uteri, unspecified: Secondary | ICD-10-CM

## 2013-11-13 DIAGNOSIS — C5700 Malignant neoplasm of unspecified fallopian tube: Secondary | ICD-10-CM

## 2013-11-13 DIAGNOSIS — E1169 Type 2 diabetes mellitus with other specified complication: Secondary | ICD-10-CM

## 2013-11-13 DIAGNOSIS — C5702 Malignant neoplasm of left fallopian tube: Secondary | ICD-10-CM

## 2013-11-13 DIAGNOSIS — D649 Anemia, unspecified: Secondary | ICD-10-CM

## 2013-11-13 DIAGNOSIS — Z5111 Encounter for antineoplastic chemotherapy: Secondary | ICD-10-CM

## 2013-11-13 DIAGNOSIS — D6481 Anemia due to antineoplastic chemotherapy: Secondary | ICD-10-CM

## 2013-11-13 DIAGNOSIS — H33309 Unspecified retinal break, unspecified eye: Secondary | ICD-10-CM

## 2013-11-13 LAB — CBC WITH DIFFERENTIAL/PLATELET
Basophils Absolute: 0 10*3/uL (ref 0.0–0.1)
EOS%: 0 % (ref 0.0–7.0)
Eosinophils Absolute: 0 10*3/uL (ref 0.0–0.5)
HGB: 9.6 g/dL — ABNORMAL LOW (ref 11.6–15.9)
MONO#: 0.1 10*3/uL (ref 0.1–0.9)
NEUT#: 3.8 10*3/uL (ref 1.5–6.5)
RBC: 3.38 10*6/uL — ABNORMAL LOW (ref 3.70–5.45)
RDW: 14.6 % — ABNORMAL HIGH (ref 11.2–14.5)
WBC: 4.9 10*3/uL (ref 3.9–10.3)
lymph#: 1.1 10*3/uL (ref 0.9–3.3)

## 2013-11-13 LAB — WHOLE BLOOD GLUCOSE
Glucose: 271 mg/dL — ABNORMAL HIGH (ref 70–100)
HRS PC: 0.5 Hours

## 2013-11-13 MED ORDER — SODIUM CHLORIDE 0.9 % IV SOLN
80.0000 mg/m2 | Freq: Once | INTRAVENOUS | Status: AC
  Administered 2013-11-13: 144 mg via INTRAVENOUS
  Filled 2013-11-13: qty 24

## 2013-11-13 MED ORDER — FAMOTIDINE IN NACL 20-0.9 MG/50ML-% IV SOLN
20.0000 mg | Freq: Once | INTRAVENOUS | Status: AC
  Administered 2013-11-13: 20 mg via INTRAVENOUS

## 2013-11-13 MED ORDER — DEXAMETHASONE SODIUM PHOSPHATE 20 MG/5ML IJ SOLN
INTRAMUSCULAR | Status: AC
  Filled 2013-11-13: qty 5

## 2013-11-13 MED ORDER — DEXAMETHASONE SODIUM PHOSPHATE 20 MG/5ML IJ SOLN
20.0000 mg | Freq: Once | INTRAMUSCULAR | Status: AC
  Administered 2013-11-13: 20 mg via INTRAVENOUS

## 2013-11-13 MED ORDER — ONDANSETRON 8 MG/50ML IVPB (CHCC)
8.0000 mg | Freq: Once | INTRAVENOUS | Status: AC
  Administered 2013-11-13: 8 mg via INTRAVENOUS

## 2013-11-13 MED ORDER — ONDANSETRON 8 MG/NS 50 ML IVPB
INTRAVENOUS | Status: AC
  Filled 2013-11-13: qty 8

## 2013-11-13 MED ORDER — SODIUM CHLORIDE 0.9 % IV SOLN
Freq: Once | INTRAVENOUS | Status: AC
  Administered 2013-11-13: 09:00:00 via INTRAVENOUS

## 2013-11-13 MED ORDER — FAMOTIDINE IN NACL 20-0.9 MG/50ML-% IV SOLN
INTRAVENOUS | Status: AC
  Filled 2013-11-13: qty 50

## 2013-11-13 MED ORDER — INSULIN REGULAR HUMAN 100 UNIT/ML IJ SOLN
6.0000 [IU] | INTRAMUSCULAR | Status: DC
  Administered 2013-11-13: 6 [IU] via SUBCUTANEOUS
  Filled 2013-11-13: qty 0.06

## 2013-11-13 MED ORDER — DIPHENHYDRAMINE HCL 50 MG/ML IJ SOLN
25.0000 mg | Freq: Once | INTRAMUSCULAR | Status: AC
  Administered 2013-11-13: 25 mg via INTRAVENOUS

## 2013-11-13 MED ORDER — FERROUS FUMARATE 324 MG PO TABS: ORAL_TABLET | ORAL | Status: DC

## 2013-11-13 MED ORDER — DIPHENHYDRAMINE HCL 50 MG/ML IJ SOLN
INTRAMUSCULAR | Status: AC
  Filled 2013-11-13: qty 1

## 2013-11-13 NOTE — Telephone Encounter (Signed)
Told Makayla Buchanan that Dr. Darrold Span would like for her to begin an iron supplement.  Ferrous fumarate ~324 mg  on an empty stomach with Oj or Vit. C tablet as her Hgb. Is a little lower. Her stools with turn black and be harder.  She should begin taking the senokot-S 1 tab bid regularly tp hjelp prevent constipation. Called in ferrous fumarate #100 tabs with 1 refill  as the cost is $26.92  For 3 months vs 24 dollars for Hemocyte #30 tabs.  Her insurance  does not cover this iron supplement.  Makayla Buchanan verbalized understanding.

## 2013-11-13 NOTE — Telephone Encounter (Signed)
appts made and printed. Pt is aware that tx's will be added. i emailed MW to add the tx's...td 

## 2013-11-13 NOTE — Patient Instructions (Signed)
[facility] Discharge Instructions for Patients Receiving Chemotherapy  Today you received the following chemotherapy agents Taxol  To help prevent nausea and vomiting after your treatment, we encourage you to take your nausea medication    If you develop nausea and vomiting that is not controlled by your nausea medication, call the clinic.   BELOW ARE SYMPTOMS THAT SHOULD BE REPORTED IMMEDIATELY:  *FEVER GREATER THAN 100.5 F  *CHILLS WITH OR WITHOUT FEVER  NAUSEA AND VOMITING THAT IS NOT CONTROLLED WITH YOUR NAUSEA MEDICATION  *UNUSUAL SHORTNESS OF BREATH  *UNUSUAL BRUISING OR BLEEDING  TENDERNESS IN MOUTH AND THROAT WITH OR WITHOUT PRESENCE OF ULCERS  *URINARY PROBLEMS  *BOWEL PROBLEMS  UNUSUAL RASH Items with * indicate a potential emergency and should be followed up as soon as possible.  Feel free to call the clinic you have any questions or concerns. The clinic phone number is [phone].    

## 2013-11-13 NOTE — Progress Notes (Signed)
OFFICE PROGRESS NOTE   11/13/2013   Physicians:Wendy Brewster, ML Lavoie, D.Elmarie Mainland (ophth), K.Heckler   INTERVAL HISTORY:  Patient is seen, alone for visit, in continuing attention to IA clinically unstaged low grade left fallopian adenocarcinoma for which she continues adjuvant chemotherapy, due day 8 cycle 2 today. She saw Dr Nelly Rout on 11-05-13, no concerns related to previous surgery and referral to genetics counseling discussed; she will see Dr Nelly Rout next in January.  Patient continues to tolerate chemotherapy generally well, tho blood sugars over 200 after decadron premedication with taxol. Blood sugars otherwise are <200. She denies peripheral neuropathy, bowels are moving regularly, no taxol aches, no worse fatigue. She has not needed gCSF to this point. She does not have PAC.  Patient has been contacted by PCP office and Dr Sharol Roussel office re getting colonoscopy, but understands that she should wait to have this until chemotherapy is completed.  Patient's husband did accompany her to office today, and I have introduced myself to him in waiting area.   ONCOLOGIC HISTORY Patient presented to Dr Seymour Bars with ~ 6 months of vaginal spotting. Endometrial biopsy in Feb 2014 found grade 1 endometrial carcinoma, with referral made to Dr Duard Brady. At that time the patient had not seen physician for diabetes in 4 years and was not checking blood sugars at home, tho she did continue to take insulin (patient states that she "did not need prescription" for the insulin); lack of medical care largely seems due to no insurance at that time. CT AP in [facility] 02-26-2013 showed low attenuation in uterus but otherwise no findings suggesting metastatic disease and adnexa not remarkable. D and C was done and Mirena IUD placed by Dr Nelly Rout on 03-26-2013, with pathology [phone] endometrioid adenocarcinoma with squamous differentiation, grade 1. Patient then established with Dr Everlene Other  and diabetes was controlled adequately to allow surgery by 09-17-13, this robotic TAH, BSO and bilateral pelvic node dissection by Dr Nelly Rout. 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 Post operative course was uncomplicated. Dr Nelly Rout recommended 6 cycles of taxol carboplatin particularly as omentectomy and periaortic nodes were not included in the surgical staging; dose dense regimen was chosen due to diabetic neuropathy and tolerance concerns. Cycle 1 day 1 was given 10-16-13.   Review of systems as above, also: No fever or symptoms of infection. No bleeding. Is able to sleep. No SOB with activity in office today. No problems at sites of peripheral IVs Remainder of 10 point Review of Systems negative.  Objective:  Vital signs in last 24 hours:  BP 147/79  Pulse 112  Temp(Src) 98 F (36.7 C) (Oral)  Resp 18  Ht 5\' 3"  (1.6 m)  Wt 158 lb 11.2 oz (71.986 kg)  BMI 28.12 kg/m2 weight is down 1 lb.  Alert, oriented and appropriate. Ambulatory without difficulty.  Partial alopecia  HEENT:PERRL, sclerae not icteric. Oral mucosa moist without lesions, posterior pharynx clear.  Neck supple. No JVD.  Lymphatics:no cervical,suraclavicular, axillary or inguinal adenopathy Resp: clear to auscultation bilaterally and normal percussion bilaterally Cardio: regular rate and rhythm. No gallop. GI: soft, nontender, not distended, no mass or organomegaly. Normally active bowel sounds. Surgical incisions not remarkable. Musculoskeletal/ Extremities: without pitting edema, cords, tenderness Neuro: no  peripheral neuropathy. Otherwise nonfocal Skin without rash, ecchymosis,  petechiae    Lab Results:  Results for orders placed in visit on 11/13/13  CBC WITH DIFFERENTIAL      Result Value Range   WBC 4.9  3.9 - 10.3 10e3/uL   NEUT# 3.8  1.5 - 6.5 10e3/uL   HGB 9.6 (*) 11.6 - 15.9 g/dL   HCT 40.9 (*) 81.1 - 91.4 %   Platelets 136 (*) 145 - 400 10e3/uL   MCV 86.4  79.5 - 101.0 fL   MCH 28.4  25.1 - 34.0 pg   MCHC 32.9  31.5 - 36.0 g/dL   RBC 7.82 (*) 9.56 - 2.13 10e6/uL   RDW 14.6 (*) 11.2 - 14.5 %   lymph# 1.1  0.9 - 3.3 10e3/uL   MONO# 0.1  0.1 - 0.9 10e3/uL   Eosinophils Absolute 0.0  0.0 - 0.5 10e3/uL   Basophils Absolute 0.0  0.0 - 0.1 10e3/uL   NEUT% 77.1 (*) 38.4 - 76.8 %   LYMPH% 21.5  14.0 - 49.7 %   MONO% 1.4  0.0 - 14.0 %   EOS% 0.0  0.0 - 7.0 %   BASO% 0.0  0.0 - 2.0 %  WHOLE BLOOD GLUCOSE      Result Value Range   Glucose 271 (*) 70 - 100 mg/dL   HRS PC 0.5      We have discussed the slightly progressive anemia, not symptomatic. Will suggest oral ferrous fumarate if she tolerates.   Studies/Results:  No results found.  Medications: I have reviewed the patient's current medications. Will cover blood sugar with SS insulin at chemo treatment as previously  DISCUSSION: she has family trip to [place] around Christmas and would prefer not to have treatment the week of Christmas.  Assessment/Plan:  1. 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 She will have day 8 cycle 2 today, then continue weekly thru 6 cycles other than break during Christmas week. I will see her during cycle 3. Note cmet is drawn ~ day 15 each cycle, with CBCs and CBGs each treatment. 2.stage 1 grade 1 endometrial carcinoma: treated surgically, no adjuvant chemo or RT recommended  3.diabetes x 20  years: better controlled presently, but has not been managed closely for years until past several months. Next appointment with Dr Everlene Other in January. Follow CBGs at this office with Rx due to premed steroids, cover with SS regular insulin + additional IVF as appropriate.  4.diabetic retinal problems: referral back to Dr Octavia Heir  5.preexisting peripheral neuropathy left toes likely also diabetic: follow on taxol  6.dental concerns: seen by [facility], patient declined intervention now  7.flu vaccine given  8. previous noncompliance: seems to have been due primarily to lack of medical insurance. Appreciate Dr Bonney Leitz assistance last several months.  9. Marked anxiety at my initial visits,improved 10. Restlessness to benadryl: premed dose decreased  11.peripheral IV access intermittently difficult but still has been obtained. 12, mild anemia related to chemotherapy, + surgery and blood draws. Add oral ferrous fumarate and follow   Patient has had questions answered to her satisfaction and is in agreement with plan.   Bonifacio Pruden P, MD   11/13/2013, 8:43 AM

## 2013-11-15 ENCOUNTER — Telehealth: Payer: Self-pay | Admitting: *Deleted

## 2013-11-15 NOTE — Telephone Encounter (Signed)
Per staff message and POF I have scheduled appts.  JMW  

## 2013-11-20 ENCOUNTER — Ambulatory Visit (HOSPITAL_BASED_OUTPATIENT_CLINIC_OR_DEPARTMENT_OTHER): Payer: BC Managed Care – PPO

## 2013-11-20 ENCOUNTER — Other Ambulatory Visit (HOSPITAL_BASED_OUTPATIENT_CLINIC_OR_DEPARTMENT_OTHER): Payer: BC Managed Care – PPO | Admitting: Lab

## 2013-11-20 VITALS — BP 130/61 | HR 78 | Temp 97.9°F | Resp 19 | Ht 63.0 in

## 2013-11-20 DIAGNOSIS — E1169 Type 2 diabetes mellitus with other specified complication: Secondary | ICD-10-CM

## 2013-11-20 DIAGNOSIS — E119 Type 2 diabetes mellitus without complications: Secondary | ICD-10-CM

## 2013-11-20 DIAGNOSIS — C5702 Malignant neoplasm of left fallopian tube: Secondary | ICD-10-CM

## 2013-11-20 DIAGNOSIS — C549 Malignant neoplasm of corpus uteri, unspecified: Secondary | ICD-10-CM

## 2013-11-20 DIAGNOSIS — C5700 Malignant neoplasm of unspecified fallopian tube: Secondary | ICD-10-CM

## 2013-11-20 DIAGNOSIS — Z5111 Encounter for antineoplastic chemotherapy: Secondary | ICD-10-CM

## 2013-11-20 LAB — COMPREHENSIVE METABOLIC PANEL (CC13)
ALT: 16 U/L (ref 0–55)
Albumin: 4 g/dL (ref 3.5–5.0)
Alkaline Phosphatase: 81 U/L (ref 40–150)
Anion Gap: 15 mEq/L — ABNORMAL HIGH (ref 3–11)
BUN: 16.3 mg/dL (ref 7.0–26.0)
CO2: 20 mEq/L — ABNORMAL LOW (ref 22–29)
Calcium: 10.2 mg/dL (ref 8.4–10.4)
Glucose: 274 mg/dl — ABNORMAL HIGH (ref 70–140)
Potassium: 4.6 mEq/L (ref 3.5–5.1)
Total Bilirubin: 0.2 mg/dL (ref 0.20–1.20)

## 2013-11-20 LAB — CBC WITH DIFFERENTIAL/PLATELET
BASO%: 0.2 % (ref 0.0–2.0)
Basophils Absolute: 0 10*3/uL (ref 0.0–0.1)
Eosinophils Absolute: 0 10*3/uL (ref 0.0–0.5)
HCT: 28.8 % — ABNORMAL LOW (ref 34.8–46.6)
HGB: 9.5 g/dL — ABNORMAL LOW (ref 11.6–15.9)
LYMPH%: 23.6 % (ref 14.0–49.7)
MCV: 86.2 fL (ref 79.5–101.0)
MONO%: 1 % (ref 0.0–14.0)
NEUT#: 3.7 10*3/uL (ref 1.5–6.5)
Platelets: 208 10*3/uL (ref 145–400)
RBC: 3.34 10*6/uL — ABNORMAL LOW (ref 3.70–5.45)
lymph#: 1.2 10*3/uL (ref 0.9–3.3)

## 2013-11-20 LAB — WHOLE BLOOD GLUCOSE
Glucose: 259 mg/dL — ABNORMAL HIGH (ref 70–100)
HRS PC: 1 Hours

## 2013-11-20 MED ORDER — DIPHENHYDRAMINE HCL 50 MG/ML IJ SOLN
INTRAMUSCULAR | Status: AC
  Filled 2013-11-20: qty 1

## 2013-11-20 MED ORDER — SODIUM CHLORIDE 0.9 % IV SOLN
80.0000 mg/m2 | Freq: Once | INTRAVENOUS | Status: AC
  Administered 2013-11-20: 144 mg via INTRAVENOUS
  Filled 2013-11-20: qty 24

## 2013-11-20 MED ORDER — ONDANSETRON 8 MG/50ML IVPB (CHCC)
8.0000 mg | Freq: Once | INTRAVENOUS | Status: AC
  Administered 2013-11-20: 8 mg via INTRAVENOUS

## 2013-11-20 MED ORDER — DIPHENHYDRAMINE HCL 50 MG/ML IJ SOLN
25.0000 mg | Freq: Once | INTRAMUSCULAR | Status: AC
  Administered 2013-11-20: 25 mg via INTRAVENOUS

## 2013-11-20 MED ORDER — SODIUM CHLORIDE 0.9 % IV SOLN
Freq: Once | INTRAVENOUS | Status: AC
  Administered 2013-11-20: 10:00:00 via INTRAVENOUS

## 2013-11-20 MED ORDER — ONDANSETRON 8 MG/NS 50 ML IVPB
INTRAVENOUS | Status: AC
  Filled 2013-11-20: qty 8

## 2013-11-20 MED ORDER — DEXAMETHASONE SODIUM PHOSPHATE 20 MG/5ML IJ SOLN
INTRAMUSCULAR | Status: AC
  Filled 2013-11-20: qty 5

## 2013-11-20 MED ORDER — DEXAMETHASONE SODIUM PHOSPHATE 20 MG/5ML IJ SOLN
20.0000 mg | Freq: Once | INTRAMUSCULAR | Status: AC
  Administered 2013-11-20: 20 mg via INTRAVENOUS

## 2013-11-20 MED ORDER — INSULIN REGULAR HUMAN 100 UNIT/ML IJ SOLN
2.0000 [IU] | INTRAMUSCULAR | Status: DC
  Administered 2013-11-20 (×2): 6 [IU] via SUBCUTANEOUS
  Filled 2013-11-20: qty 0.08

## 2013-11-20 MED ORDER — FAMOTIDINE IN NACL 20-0.9 MG/50ML-% IV SOLN
INTRAVENOUS | Status: AC
  Filled 2013-11-20: qty 50

## 2013-11-20 MED ORDER — FAMOTIDINE IN NACL 20-0.9 MG/50ML-% IV SOLN
20.0000 mg | Freq: Once | INTRAVENOUS | Status: AC
  Administered 2013-11-20: 20 mg via INTRAVENOUS

## 2013-11-20 NOTE — Patient Instructions (Signed)
[facility] Discharge Instructions for Patients Receiving Chemotherapy  Today you received the following chemotherapy agents: Taxol.  To help prevent nausea and vomiting after your treatment, we encourage you to take your nausea medication as prescribed.   If you develop nausea and vomiting that is not controlled by your nausea medication, call the clinic.   BELOW ARE SYMPTOMS THAT SHOULD BE REPORTED IMMEDIATELY:  *FEVER GREATER THAN 100.5 F  *CHILLS WITH OR WITHOUT FEVER  NAUSEA AND VOMITING THAT IS NOT CONTROLLED WITH YOUR NAUSEA MEDICATION  *UNUSUAL SHORTNESS OF BREATH  *UNUSUAL BRUISING OR BLEEDING  TENDERNESS IN MOUTH AND THROAT WITH OR WITHOUT PRESENCE OF ULCERS  *URINARY PROBLEMS  *BOWEL PROBLEMS  UNUSUAL RASH Items with * indicate a potential emergency and should be followed up as soon as possible.  Feel free to call the clinic you have any questions or concerns. The clinic phone number is [phone].    

## 2013-11-20 NOTE — Progress Notes (Signed)
Whole blood glucose 270 mg/dl upon discharge patient covered with 6 units of insulin per Dr. Darrold Span sliding scale orders. Patient offered nutrition upon discharge, patient declined stating she is going to eat as soon as she leaves here. Patient verbalized understanding she is to eat within 30 minutes of receiving insulin.

## 2013-11-25 ENCOUNTER — Other Ambulatory Visit: Payer: Self-pay

## 2013-11-25 DIAGNOSIS — C541 Malignant neoplasm of endometrium: Secondary | ICD-10-CM

## 2013-11-25 MED ORDER — DEXAMETHASONE 4 MG PO TABS: ORAL_TABLET | ORAL | Status: DC

## 2013-11-25 NOTE — Telephone Encounter (Signed)
Notified Makayla Buchanan that the decadron was sent to her pharmacy.  Gave her 30 tabs this time with 1 refill.  Enough for 6 treatments.  Patient verbalized understanding.

## 2013-11-27 ENCOUNTER — Other Ambulatory Visit (HOSPITAL_BASED_OUTPATIENT_CLINIC_OR_DEPARTMENT_OTHER): Payer: BC Managed Care – PPO

## 2013-11-27 ENCOUNTER — Other Ambulatory Visit: Payer: Self-pay | Admitting: Oncology

## 2013-11-27 ENCOUNTER — Ambulatory Visit (HOSPITAL_BASED_OUTPATIENT_CLINIC_OR_DEPARTMENT_OTHER): Payer: BC Managed Care – PPO

## 2013-11-27 VITALS — BP 133/52 | HR 102 | Temp 98.6°F | Resp 18

## 2013-11-27 DIAGNOSIS — E119 Type 2 diabetes mellitus without complications: Secondary | ICD-10-CM

## 2013-11-27 DIAGNOSIS — C5702 Malignant neoplasm of left fallopian tube: Secondary | ICD-10-CM

## 2013-11-27 DIAGNOSIS — C5700 Malignant neoplasm of unspecified fallopian tube: Secondary | ICD-10-CM

## 2013-11-27 DIAGNOSIS — Z5111 Encounter for antineoplastic chemotherapy: Secondary | ICD-10-CM

## 2013-11-27 LAB — CBC WITH DIFFERENTIAL/PLATELET
BASO%: 0.2 % (ref 0.0–2.0)
Basophils Absolute: 0 10*3/uL (ref 0.0–0.1)
Eosinophils Absolute: 0 10*3/uL (ref 0.0–0.5)
HCT: 29.5 % — ABNORMAL LOW (ref 34.8–46.6)
HGB: 9.6 g/dL — ABNORMAL LOW (ref 11.6–15.9)
LYMPH%: 22.1 % (ref 14.0–49.7)
MCH: 28.6 pg (ref 25.1–34.0)
MCV: 87.8 fL (ref 79.5–101.0)
MONO#: 0 10*3/uL — ABNORMAL LOW (ref 0.1–0.9)
MONO%: 0.5 % (ref 0.0–14.0)
NEUT#: 4.6 10*3/uL (ref 1.5–6.5)
NEUT%: 77.2 % — ABNORMAL HIGH (ref 38.4–76.8)
Platelets: 170 10*3/uL (ref 145–400)
RBC: 3.36 10*6/uL — ABNORMAL LOW (ref 3.70–5.45)
WBC: 6 10*3/uL (ref 3.9–10.3)

## 2013-11-27 LAB — WHOLE BLOOD GLUCOSE
Glucose: 273 mg/dL — ABNORMAL HIGH (ref 70–100)
Glucose: 143 mg/dL — ABNORMAL HIGH (ref 70–100)

## 2013-11-27 MED ORDER — DEXAMETHASONE SODIUM PHOSPHATE 20 MG/5ML IJ SOLN
20.0000 mg | Freq: Once | INTRAMUSCULAR | Status: AC
  Administered 2013-11-27: 20 mg via INTRAVENOUS

## 2013-11-27 MED ORDER — SODIUM CHLORIDE 0.9 % IV SOLN
Freq: Once | INTRAVENOUS | Status: AC
  Administered 2013-11-27: 09:00:00 via INTRAVENOUS

## 2013-11-27 MED ORDER — FAMOTIDINE IN NACL 20-0.9 MG/50ML-% IV SOLN
20.0000 mg | Freq: Once | INTRAVENOUS | Status: AC
  Administered 2013-11-27: 20 mg via INTRAVENOUS

## 2013-11-27 MED ORDER — ONDANSETRON 16 MG/50ML IVPB (CHCC)
INTRAVENOUS | Status: AC
  Filled 2013-11-27: qty 16

## 2013-11-27 MED ORDER — DEXAMETHASONE SODIUM PHOSPHATE 20 MG/5ML IJ SOLN
INTRAMUSCULAR | Status: AC
  Filled 2013-11-27: qty 5

## 2013-11-27 MED ORDER — DIPHENHYDRAMINE HCL 50 MG/ML IJ SOLN
INTRAMUSCULAR | Status: AC
  Filled 2013-11-27: qty 1

## 2013-11-27 MED ORDER — SODIUM CHLORIDE 0.9 % IV SOLN
613.2000 mg | Freq: Once | INTRAVENOUS | Status: AC
  Administered 2013-11-27: 610 mg via INTRAVENOUS
  Filled 2013-11-27: qty 61

## 2013-11-27 MED ORDER — DIPHENHYDRAMINE HCL 50 MG/ML IJ SOLN
25.0000 mg | Freq: Once | INTRAMUSCULAR | Status: AC
  Administered 2013-11-27: 25 mg via INTRAVENOUS

## 2013-11-27 MED ORDER — PACLITAXEL CHEMO INJECTION 300 MG/50ML: 80.0000 mg/m2 | Freq: Once | INTRAVENOUS | Status: DC

## 2013-11-27 MED ORDER — ONDANSETRON 16 MG/50ML IVPB (CHCC)
16.0000 mg | Freq: Once | INTRAVENOUS | Status: AC
  Administered 2013-11-27: 16 mg via INTRAVENOUS

## 2013-11-27 MED ORDER — SODIUM CHLORIDE 0.9 % IV SOLN
80.0000 mg/m2 | Freq: Once | INTRAVENOUS | Status: AC
  Administered 2013-11-27: 144 mg via INTRAVENOUS
  Filled 2013-11-27: qty 24

## 2013-11-27 MED ORDER — FAMOTIDINE IN NACL 20-0.9 MG/50ML-% IV SOLN
INTRAVENOUS | Status: AC
  Filled 2013-11-27: qty 50

## 2013-11-27 MED ORDER — INSULIN REGULAR HUMAN 100 UNIT/ML IJ SOLN
6.0000 [IU] | INTRAMUSCULAR | Status: DC
  Administered 2013-11-27: 6 [IU] via SUBCUTANEOUS
  Filled 2013-11-27: qty 0.06

## 2013-11-27 NOTE — Patient Instructions (Signed)
[facility] Discharge Instructions for Patients Receiving Chemotherapy  Today you received the following chemotherapy agents Taxol/Carbo  To help prevent nausea and vomiting after your treatment, we encourage you to take your nausea medication as prescribed.   If you develop nausea and vomiting that is not controlled by your nausea medication, call the clinic.   BELOW ARE SYMPTOMS THAT SHOULD BE REPORTED IMMEDIATELY:  *FEVER GREATER THAN 100.5 F  *CHILLS WITH OR WITHOUT FEVER  NAUSEA AND VOMITING THAT IS NOT CONTROLLED WITH YOUR NAUSEA MEDICATION  *UNUSUAL SHORTNESS OF BREATH  *UNUSUAL BRUISING OR BLEEDING  TENDERNESS IN MOUTH AND THROAT WITH OR WITHOUT PRESENCE OF ULCERS  *URINARY PROBLEMS  *BOWEL PROBLEMS  UNUSUAL RASH Items with * indicate a potential emergency and should be followed up as soon as possible.  Feel free to call the clinic should you have any questions or concerns. The clinic phone number is [phone].  It was a pleasure to serve you today.

## 2013-12-03 ENCOUNTER — Other Ambulatory Visit: Payer: Self-pay | Admitting: Oncology

## 2013-12-04 ENCOUNTER — Telehealth: Payer: Self-pay | Admitting: *Deleted

## 2013-12-04 ENCOUNTER — Encounter: Payer: Self-pay | Admitting: Oncology

## 2013-12-04 ENCOUNTER — Ambulatory Visit (HOSPITAL_BASED_OUTPATIENT_CLINIC_OR_DEPARTMENT_OTHER): Payer: BC Managed Care – PPO | Admitting: Oncology

## 2013-12-04 ENCOUNTER — Telehealth: Payer: Self-pay | Admitting: Oncology

## 2013-12-04 ENCOUNTER — Other Ambulatory Visit (HOSPITAL_BASED_OUTPATIENT_CLINIC_OR_DEPARTMENT_OTHER): Payer: BC Managed Care – PPO

## 2013-12-04 ENCOUNTER — Ambulatory Visit (HOSPITAL_BASED_OUTPATIENT_CLINIC_OR_DEPARTMENT_OTHER): Payer: BC Managed Care – PPO

## 2013-12-04 VITALS — BP 146/54 | HR 109 | Temp 99.1°F | Resp 19 | Ht 63.0 in | Wt 156.4 lb

## 2013-12-04 DIAGNOSIS — E119 Type 2 diabetes mellitus without complications: Secondary | ICD-10-CM

## 2013-12-04 DIAGNOSIS — C5700 Malignant neoplasm of unspecified fallopian tube: Secondary | ICD-10-CM

## 2013-12-04 DIAGNOSIS — C5702 Malignant neoplasm of left fallopian tube: Secondary | ICD-10-CM

## 2013-12-04 DIAGNOSIS — C549 Malignant neoplasm of corpus uteri, unspecified: Secondary | ICD-10-CM

## 2013-12-04 DIAGNOSIS — C541 Malignant neoplasm of endometrium: Secondary | ICD-10-CM

## 2013-12-04 DIAGNOSIS — Z5111 Encounter for antineoplastic chemotherapy: Secondary | ICD-10-CM

## 2013-12-04 LAB — CBC WITH DIFFERENTIAL/PLATELET
BASO%: 0.2 % (ref 0.0–2.0)
Basophils Absolute: 0 10*3/uL (ref 0.0–0.1)
Eosinophils Absolute: 0 10*3/uL (ref 0.0–0.5)
HCT: 27.2 % — ABNORMAL LOW (ref 34.8–46.6)
MCHC: 33.1 g/dL (ref 31.5–36.0)
MONO#: 0 10*3/uL — ABNORMAL LOW (ref 0.1–0.9)
MONO%: 0.6 % (ref 0.0–14.0)
NEUT#: 3.9 10*3/uL (ref 1.5–6.5)
RBC: 3.11 10*6/uL — ABNORMAL LOW (ref 3.70–5.45)
RDW: 16.9 % — ABNORMAL HIGH (ref 11.2–14.5)
WBC: 4.9 10*3/uL (ref 3.9–10.3)

## 2013-12-04 LAB — WHOLE BLOOD GLUCOSE
Glucose: 196 mg/dL — ABNORMAL HIGH (ref 70–100)
Glucose: 130 mg/dL — ABNORMAL HIGH (ref 70–100)

## 2013-12-04 MED ORDER — SODIUM CHLORIDE 0.9 % IV SOLN
Freq: Once | INTRAVENOUS | Status: AC
  Administered 2013-12-04: 11:00:00 via INTRAVENOUS

## 2013-12-04 MED ORDER — DEXAMETHASONE SODIUM PHOSPHATE 20 MG/5ML IJ SOLN
20.0000 mg | Freq: Once | INTRAMUSCULAR | Status: AC
  Administered 2013-12-04: 20 mg via INTRAVENOUS

## 2013-12-04 MED ORDER — DIPHENHYDRAMINE HCL 50 MG/ML IJ SOLN
25.0000 mg | Freq: Once | INTRAMUSCULAR | Status: AC
  Administered 2013-12-04: 25 mg via INTRAVENOUS

## 2013-12-04 MED ORDER — DEXAMETHASONE SODIUM PHOSPHATE 20 MG/5ML IJ SOLN
INTRAMUSCULAR | Status: AC
  Filled 2013-12-04: qty 5

## 2013-12-04 MED ORDER — FAMOTIDINE IN NACL 20-0.9 MG/50ML-% IV SOLN
20.0000 mg | Freq: Once | INTRAVENOUS | Status: AC
  Administered 2013-12-04: 20 mg via INTRAVENOUS

## 2013-12-04 MED ORDER — SODIUM CHLORIDE 0.9 % IV SOLN
80.0000 mg/m2 | Freq: Once | INTRAVENOUS | Status: AC
  Administered 2013-12-04: 144 mg via INTRAVENOUS
  Filled 2013-12-04: qty 24

## 2013-12-04 MED ORDER — ONDANSETRON 8 MG/NS 50 ML IVPB
INTRAVENOUS | Status: AC
  Filled 2013-12-04: qty 8

## 2013-12-04 MED ORDER — INSULIN REGULAR HUMAN 100 UNIT/ML IJ SOLN
2.0000 [IU] | INTRAMUSCULAR | Status: DC
Start: 2013-12-04 — End: 2013-12-04
  Administered 2013-12-04: 2 [IU] via SUBCUTANEOUS
  Filled 2013-12-04: qty 0.08

## 2013-12-04 MED ORDER — DIPHENHYDRAMINE HCL 50 MG/ML IJ SOLN
INTRAMUSCULAR | Status: AC
  Filled 2013-12-04: qty 1

## 2013-12-04 MED ORDER — ONDANSETRON 8 MG/50ML IVPB (CHCC)
8.0000 mg | Freq: Once | INTRAVENOUS | Status: AC
  Administered 2013-12-04: 8 mg via INTRAVENOUS

## 2013-12-04 NOTE — Progress Notes (Signed)
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 She had day 1 cycle 3 on 11-27-13, with increase in carboplatin back to AUC=6 as counts have maintained well; she was more fatigued this past weekend, spending most of Saturday in bed, but did not have increased nausea. She has noticed "tightness" in distal feet L>R, which seems to be peripheral neuropathy. This is not interfering with ambulating and is not keeping her awake at night; she has no neuropathy symptoms in hands. Blood sugars are 80s-100s when off steroids.  She does not have PAC.  Her granddaughter may have baby within next week, not sure if trip to [place] will happen.   ONCOLOGIC HISTORY Oncology History   Stage 1 grade 1 at [facility] and pelvic lymph node dissection 09-17-13, with <50% myometrial invasion and no lymphovascular space invasion     Endometrial cancer   03/07/2013 Initial Diagnosis Endometrial cancer   Patient presented to Dr Seymour Bars with ~ 6 months of vaginal spotting. Endometrial biopsy in Feb 2014 found grade 1 endometrial carcinoma, with referral made to Dr Duard Brady. At that time the patient had not seen physician for diabetes in 4 years and was not checking blood sugars at home, tho she did continue to take insulin (patient states that she "did not need prescription" for the insulin); lack of medical care largely seems due to no insurance at that time. CT AP in [place] system 02-26-2013 showed low attenuation in uterus but otherwise no findings suggesting metastatic disease and adnexa not remarkable. D and C was done and Mirena IUD placed by Dr Nelly Rout on 03-26-2013, with pathology [phone] endometrioid adenocarcinoma with squamous differentiation, grade 1. Patient  then established with Dr Everlene Other and diabetes was controlled adequately to allow surgery by 09-17-13, this robotic [facility], BSO and bilateral pelvic node dissection by Dr Nelly Rout. 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 Post operative course was uncomplicated. Dr Nelly Rout recommended 6 cycles of taxol carboplatin particularly as omentectomy and periaortic nodes were not included in the surgical staging; dose dense regimen was chosen due to diabetic neuropathy and tolerance concerns. Cycle 1 day 1 was given 10-16-13.  Review of systems as above, also: No fever or symptoms of infection. Bowels moving. No SOB. Left leg "tired" when walking distances. Remainder of 10 point Review of Systems negative.  Objective:  Vital signs in last 24 hours:  BP 146/54  Pulse 109  Temp(Src) 99.1 F (37.3 C) (Oral)  Resp 19  Ht 5\' 3"  (1.6 m)  Wt 156 lb 6.4 oz (70.943 kg)  BMI 27.71 kg/m2  SpO2 100%  Weight is down 2.5 lbs.  Alert, oriented and appropriate. Ambulatory without difficulty.  Alopecia  HEENT:PERRL, sclerae not icteric. Oral mucosa moist without lesions, posterior pharynx clear.  Neck supple. No JVD.  Lymphatics:no cervical,suraclavicular, axillary or inguinal adenopathy Resp: clear to auscultation bilaterally and normal percussion bilaterally Cardio: regular rate and rhythm. No gallop. GI: soft, nontender, not distended, no mass or organomegaly. Normally active bowel sounds. Surgical incision not remarkable. Musculoskeletal/ Extremities:  without pitting edema, cords, tenderness Neuro: peripheral neuropathy distal feet, otherwise nonfocal. Psych as above Skin  without rash, ecchymosis, petechiae   Lab Results:  Results for orders placed in visit on 12/04/13  CBC WITH DIFFERENTIAL      Result Value Range   WBC 4.9  3.9 - 10.3 10e3/uL   NEUT# 3.9  1.5 - 6.5 10e3/uL   HGB 9.0 (*) 11.6 - 15.9 g/dL   HCT 16.1 (*) 09.6 - 04.5 %   Platelets 168  145 - 400 10e3/uL   MCV 87.5  79.5 - 101.0 fL   MCH 28.9  25.1 - 34.0 pg   MCHC 33.1  31.5 - 36.0 g/dL   RBC 4.09 (*) 8.11 - 9.14 10e6/uL   RDW 16.9 (*) 11.2 - 14.5 %   lymph# 1.0  0.9 - 3.3 10e3/uL   MONO# 0.0 (*) 0.1 - 0.9 10e3/uL   Eosinophils Absolute 0.0  0.0 - 0.5 10e3/uL   Basophils Absolute 0.0  0.0 - 0.1 10e3/uL   NEUT% 78.9 (*) 38.4 - 76.8 %   LYMPH% 20.3  14.0 - 49.7 %   MONO% 0.6  0.0 - 14.0 %   EOS% 0.0  0.0 - 7.0 %   BASO% 0.2  0.0 - 2.0 %  WHOLE BLOOD GLUCOSE      Result Value Range   Glucose 196 (*) 70 - 100 mg/dL   HRS PC 45 min       Studies/Results:  No results found.  Medications: I have reviewed the patient's current medications. SS insulin planned with treatment today.  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 As symptoms in feet are fairly minimal now, will proceed with taxol for day 8 cycle 3 today as planned, however I will see her prior to (one week delayed) day 15 cycle 3 on 12-18-13 and we may need to change to q 3 week carbo taxotere then if progression of the neuropathy symptoms  Assessment/Plan:  1. Clinical stage 1 low grade left fallopian carcinoma: found incidentally at [facility] pelvic node evaluation for early stage endometrial cancer, without omentectomy or para aortic node evaluation: adjuvant taxol carboplatin using weekly dose dense schedule begun 10-16-13, this regimen chosen for tolerance concerns but may need to change to q 3 week carbo taxotere depending on peripheral neuropathy at next visit.  2.stage 1 grade 1 endometrial carcinoma: treated surgically, no adjuvant chemo or RT recommended  3.diabetes x 20 years: better  controlled presently, but has not been managed closely for years until past several months. Next appointment with Dr Everlene Other in January. Follow CBGs at this office with Rx due to premed steroids, cover with SS regular insulin + additional IVF as appropriate.  4.diabetic retinal problems 5.preexisting peripheral neuropathy left toes likely also diabetic: likely increasing peripheral neuropathy in distal feet bilaterally. May need to change taxol to taxotere as above. 6.dental concerns: seen by [facility], patient declined intervention now  7.flu vaccine given  8. previous noncompliance due primarily to lack of medical insurance then 9. Marked anxiety at my initial visits,improved  10. Restlessness to benadryl: premed dose decreased  11.peripheral IV access intermittently difficult but still has been obtained.  12, mild anemia related to chemotherapy, + surgery and blood draws. On oral ferrous fumarate, follow   I will see her 12-29 in follow up of neuropathy, next taxol after today planned 12-31. Patient is in agreement with treatment today and rest of plan as above.  LIVESAY,LENNIS P, MD   12/04/2013, 10:27 AM

## 2013-12-04 NOTE — Patient Instructions (Signed)
[facility] Discharge Instructions for Patients Receiving Chemotherapy  Today you received the following chemotherapy agents: Taxol.  To help prevent nausea and vomiting after your treatment, we encourage you to take your nausea medication as prescribed.   If you develop nausea and vomiting that is not controlled by your nausea medication, call the clinic.   BELOW ARE SYMPTOMS THAT SHOULD BE REPORTED IMMEDIATELY:  *FEVER GREATER THAN 100.5 F  *CHILLS WITH OR WITHOUT FEVER  NAUSEA AND VOMITING THAT IS NOT CONTROLLED WITH YOUR NAUSEA MEDICATION  *UNUSUAL SHORTNESS OF BREATH  *UNUSUAL BRUISING OR BLEEDING  TENDERNESS IN MOUTH AND THROAT WITH OR WITHOUT PRESENCE OF ULCERS  *URINARY PROBLEMS  *BOWEL PROBLEMS  UNUSUAL RASH Items with * indicate a potential emergency and should be followed up as soon as possible.  Feel free to call the clinic you have any questions or concerns. The clinic phone number is [phone].    

## 2013-12-04 NOTE — Progress Notes (Signed)
Whole blood glucose 130 upon discharge no insulin coverage required.

## 2013-12-04 NOTE — Telephone Encounter (Signed)
Per staff message and POF I have scheduled appts.  JMW  

## 2013-12-04 NOTE — Telephone Encounter (Signed)
, °

## 2013-12-08 ENCOUNTER — Other Ambulatory Visit: Payer: Self-pay | Admitting: Oncology

## 2013-12-16 ENCOUNTER — Other Ambulatory Visit: Payer: Self-pay | Admitting: Oncology

## 2013-12-16 ENCOUNTER — Ambulatory Visit (HOSPITAL_COMMUNITY)
Admission: RE | Admit: 2013-12-16 | Discharge: 2013-12-16 | Disposition: A | Discharge status: Home / Self Care | Payer: BC Managed Care – PPO | Source: Ambulatory Visit | Attending: Oncology | Admitting: Oncology

## 2013-12-16 ENCOUNTER — Encounter: Payer: Self-pay | Admitting: Oncology

## 2013-12-16 ENCOUNTER — Ambulatory Visit (HOSPITAL_BASED_OUTPATIENT_CLINIC_OR_DEPARTMENT_OTHER): Payer: BC Managed Care – PPO | Admitting: Oncology

## 2013-12-16 ENCOUNTER — Telehealth: Payer: Self-pay | Admitting: Oncology

## 2013-12-16 ENCOUNTER — Other Ambulatory Visit (HOSPITAL_BASED_OUTPATIENT_CLINIC_OR_DEPARTMENT_OTHER): Payer: BC Managed Care – PPO

## 2013-12-16 VITALS — BP 144/75 | HR 106 | Temp 98.1°F | Resp 20 | Ht 63.0 in | Wt 162.1 lb

## 2013-12-16 DIAGNOSIS — C5702 Malignant neoplasm of left fallopian tube: Secondary | ICD-10-CM

## 2013-12-16 DIAGNOSIS — C549 Malignant neoplasm of corpus uteri, unspecified: Secondary | ICD-10-CM

## 2013-12-16 DIAGNOSIS — D649 Anemia, unspecified: Secondary | ICD-10-CM | POA: Insufficient documentation

## 2013-12-16 DIAGNOSIS — E119 Type 2 diabetes mellitus without complications: Secondary | ICD-10-CM

## 2013-12-16 DIAGNOSIS — C5700 Malignant neoplasm of unspecified fallopian tube: Secondary | ICD-10-CM | POA: Insufficient documentation

## 2013-12-16 DIAGNOSIS — G609 Hereditary and idiopathic neuropathy, unspecified: Secondary | ICD-10-CM

## 2013-12-16 DIAGNOSIS — T451X5A Adverse effect of antineoplastic and immunosuppressive drugs, initial encounter: Secondary | ICD-10-CM

## 2013-12-16 DIAGNOSIS — D6481 Anemia due to antineoplastic chemotherapy: Secondary | ICD-10-CM

## 2013-12-16 LAB — CBC WITH DIFFERENTIAL/PLATELET
EOS%: 0.5 % (ref 0.0–7.0)
Eosinophils Absolute: 0 10*3/uL (ref 0.0–0.5)
LYMPH%: 33.5 % (ref 14.0–49.7)
MCH: 30.4 pg (ref 25.1–34.0)
MCV: 90.5 fL (ref 79.5–101.0)
MONO#: 0.6 10*3/uL (ref 0.1–0.9)
MONO%: 9.8 % (ref 0.0–14.0)
NEUT#: 3.3 10*3/uL (ref 1.5–6.5)
Platelets: 111 10*3/uL — ABNORMAL LOW (ref 145–400)
RBC: 2.51 10*6/uL — ABNORMAL LOW (ref 3.70–5.45)
RDW: 20.4 % — ABNORMAL HIGH (ref 11.2–14.5)

## 2013-12-16 LAB — COMPREHENSIVE METABOLIC PANEL (CC13)
AST: 11 U/L (ref 5–34)
Albumin: 3.4 g/dL — ABNORMAL LOW (ref 3.5–5.0)
Alkaline Phosphatase: 88 U/L (ref 40–150)
BUN: 16 mg/dL (ref 7.0–26.0)
CO2: 25 mEq/L (ref 22–29)
Glucose: 327 mg/dl — ABNORMAL HIGH (ref 70–140)
Potassium: 4.4 mEq/L (ref 3.5–5.1)
Sodium: 144 mEq/L (ref 136–145)
Total Bilirubin: 0.21 mg/dL (ref 0.20–1.20)
Total Protein: 6.7 g/dL (ref 6.4–8.3)

## 2013-12-16 NOTE — Progress Notes (Signed)
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 Last treatment was day 8 cycle 3 taxol on 12-04-13, with day "15" cycle 3 planned 12-18-13 (no treatment given week of Christmas). She does not notice any worsening in "tightness" of distal feet bilaterally, which seems to be sensory neuropathy. This sensation is not uncomfortable, she has no actual swelling in feet, and is not interfering with ambulation. She is more SOB with activity such as walking distances, and notices that her legs "feel tired" with this; she is not SOB at rest and has had no chest pain.  She has had no bleeding. She has no neuropathy symptoms in hands.  She does not have PAC.  ONCOLOGIC HISTORY Patient presented to Dr Seymour Bars with ~ 6 months of vaginal spotting. Endometrial biopsy in Feb 2014 found grade 1 endometrial carcinoma, with referral made to Dr Duard Brady. At that time the patient had not seen physician for diabetes in 4 years and was not checking blood sugars at home, tho she did continue to take insulin (patient states that she "did not need prescription" for the insulin); lack of medical care largely seems due to no insurance at that time. CT AP in [place] system 02-26-2013 showed low attenuation in uterus but otherwise no findings suggesting metastatic disease and adnexa not remarkable. D and C was done and Mirena IUD placed by Dr Nelly Rout on 03-26-2013, with pathology [phone] endometrioid adenocarcinoma with squamous differentiation, grade 1. Patient then established with Dr Everlene Other and diabetes was controlled adequately to allow surgery by  09-17-13, this robotic [facility], BSO and bilateral pelvic node dissection by Dr Nelly Rout. 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 Post operative course was uncomplicated. Dr Nelly Rout recommended 6 cycles of taxol carboplatin particularly as omentectomy and periaortic nodes were not included in the surgical staging; dose dense regimen was chosen due to diabetic neuropathy and tolerance concerns. Cycle 1 day 1 was given 10-16-13.     Review of systems as above, also: No fever or symptoms of infection. She has not been following diabetic diet over holidays. No pain. Bowels ok. No cramping pain in legs. Remainder of 10 point Review of Systems negative.  Objective:  Vital signs in last 24 hours:  BP 144/75  Pulse 106  Temp(Src) 98.1 F (36.7 C) (Oral)  Resp 20  Ht 5\' 3"  (1.6 m)  Wt 162 lb 1.6 oz (73.528 kg)  BMI 28.72 kg/m2 Weight is up 6 lbs. Alert, oriented and appropriate. Ambulatory and mobile in exam room without assistance.  Partial alopecia  HEENT:PERRL, sclerae not icteric. Oral mucosa moist without lesions, posterior pharynx clear. Mucous membranes pale. Neck supple. No JVD.  Lymphatics:no cervical,suraclavicular or inguinal adenopathy Resp: clear to auscultation bilaterally and normal percussion bilaterally Cardio: tachy, regular rate and rhythm. No gallop. GI: soft, nontender, not more distended, no mass or organomegaly. Normally active bowel sounds. Surgical incision not remarkable. Musculoskeletal/ Extremities: without pitting edema,  cords, tenderness. Feet warm. Back not tender Neuro: decreased sensation distal feet, otherwise nonfocal Skin  without rash, ecchymosis, petechiae .   Lab Results:  Results for orders placed in visit on 12/16/13  CBC WITH DIFFERENTIAL      Result Value Range   WBC 5.9  3.9 - 10.3 10e3/uL   NEUT# 3.3  1.5 - 6.5 10e3/uL   HGB 7.6 (*) 11.6 - 15.9 g/dL   HCT 91.4 (*) 78.2 - 95.6 %   Platelets 111 (*) 145 - 400 10e3/uL   MCV 90.5  79.5 - 101.0 fL   MCH 30.4  25.1 - 34.0 pg   MCHC 33.6  31.5 - 36.0 g/dL   RBC 2.13 (*) 0.86 - 5.78 10e6/uL   RDW 20.4 (*) 11.2 - 14.5 %   lymph# 2.0  0.9 - 3.3 10e3/uL   MONO# 0.6  0.1 - 0.9 10e3/uL   Eosinophils Absolute 0.0  0.0 - 0.5 10e3/uL   Basophils Absolute 0.0  0.0 - 0.1 10e3/uL   NEUT% 56.1  38.4 - 76.8 %   LYMPH% 33.5  14.0 - 49.7 %   MONO% 9.8  0.0 - 14.0 %   EOS% 0.5  0.0 - 7.0 %   BASO% 0.1  0.0 - 2.0 %  COMPREHENSIVE METABOLIC PANEL (CC13)      Result Value Range   Sodium 144  136 - 145 mEq/L   Potassium 4.4  3.5 - 5.1 mEq/L   Chloride 108  98 - 109 mEq/L   CO2 25  22 - 29 mEq/L   Glucose 327 (*) 70 - 140 mg/dl   BUN 46.9  7.0 - 62.9 mg/dL   Creatinine 1.0  0.6 - 1.1 mg/dL   Total Bilirubin 5.28  0.20 - 1.20 mg/dL   Alkaline Phosphatase 88  40 - 150 U/L   AST 11  5 - 34 U/L   ALT 11  0 - 55 U/L   Total Protein 6.7  6.4 - 8.3 g/dL   Albumin 3.4 (*) 3.5 - 5.0 g/dL   Calcium 9.1  8.4 - 41.3 mg/dL   Anion Gap 10  3 - 11 mEq/L     Studies/Results:  No results found.  Medications: I have reviewed the patient's current medications. She is tolerating oral iron. I will check iron/ ferritin with next labs.  DISCUSSION: peripheral neuropathy symptoms do not seem worse with most recent low dose taxol, so that we will carefully continue this for now. She understands that she should let us know if peripheral neuropathy symptoms worsen, as we can change regimen to taxotere if so.  We have discussed anemia related to chemo, which seems more symptomatic and certainly will decrease further with additional carboplatin, which I do not think would be  tolerable for her. She and husband are in agreement with recommendation for 1 unit PRBCs, which can be given with chemo on 12-31 (crossmatch to be done today).   Assessment/Plan: 1. Clinical stage 1 low grade left fallopian carcinoma: found incidentally at [facility] pelvic node evaluation for early stage endometrial cancer, without omentectomy or para aortic node evaluation: adjuvant taxol carboplatin using weekly dose dense schedule begun 10-16-13, this regimen chosen for tolerance concerns but may need to change to q 3 week carbo taxotere depending on peripheral neuropathy as we continue treatment. For now with stable peripheral neuropathy symptoms will continue with low dose weekly taxol. I will see her with next treatment 12-25-12. 2.stage 1 grade 1 endometrial carcinoma: treated  surgically, no adjuvant chemo or RT recommended  3.diabetes x 20 years: better controlled presently, but has not been managed closely for years until past several months. Next appointment with Dr Everlene Other in January. Follow CBGs at this office with Rx due to premed steroids, cover with SS regular insulin + additional IVF as appropriate.  4.diabetic retinal problems  5.preexisting peripheral neuropathy left toes likely also diabetic: likely increasing peripheral neuropathy in distal feet bilaterally. May need to change taxol to taxotere as above.  6.dental concerns: seen by [facility], patient declined intervention now  7.flu vaccine given  8. previous noncompliance due primarily to lack of medical insurance then  9. Marked anxiety at my initial visits,improved  10. Restlessness to benadryl: premed dose decreased  11.peripheral IV access intermittently difficult but still has been obtained.  12, progressive anemia: symptomatic, for 1 unit PRBCs 12-18-13. Continue oral iron and will check iron/ferritin with next labs as I do not find these in EMR now.     Patient and husband have had questions answered to their  satisfaction and are in agreement with plan.   LIVESAY,LENNIS P, MD   12/16/2013, 4:50 PM

## 2013-12-16 NOTE — Patient Instructions (Signed)
Continue iron. With hemoglobin 7.6 today, we will give you one unit of blood on 12-18-13 with your chemo that day.   Let Dr Darrold Span know if the numbness/ tightness in your feet is more bothersome.

## 2013-12-17 LAB — CA 125: CA 125: 5.7 U/mL (ref 0.0–30.2)

## 2013-12-18 ENCOUNTER — Ambulatory Visit (HOSPITAL_BASED_OUTPATIENT_CLINIC_OR_DEPARTMENT_OTHER): Payer: BC Managed Care – PPO | Admitting: Oncology

## 2013-12-18 ENCOUNTER — Ambulatory Visit (HOSPITAL_BASED_OUTPATIENT_CLINIC_OR_DEPARTMENT_OTHER): Payer: BC Managed Care – PPO

## 2013-12-18 VITALS — BP 124/61 | HR 86 | Temp 98.0°F | Resp 18

## 2013-12-18 DIAGNOSIS — Z5111 Encounter for antineoplastic chemotherapy: Secondary | ICD-10-CM

## 2013-12-18 DIAGNOSIS — C5700 Malignant neoplasm of unspecified fallopian tube: Secondary | ICD-10-CM

## 2013-12-18 DIAGNOSIS — C5702 Malignant neoplasm of left fallopian tube: Secondary | ICD-10-CM

## 2013-12-18 DIAGNOSIS — D649 Anemia, unspecified: Secondary | ICD-10-CM

## 2013-12-18 LAB — IRON AND TIBC CHCC
Iron: 62 ug/dL (ref 41–142)
TIBC: 264 ug/dL (ref 236–444)
UIBC: 202 ug/dL (ref 120–384)

## 2013-12-18 LAB — WHOLE BLOOD GLUCOSE: Glucose: 128 mg/dL — ABNORMAL HIGH (ref 70–100)

## 2013-12-18 LAB — FERRITIN CHCC: Ferritin: 397 ng/ml — ABNORMAL HIGH (ref 9–269)

## 2013-12-18 MED ORDER — DIPHENHYDRAMINE HCL 50 MG/ML IJ SOLN
25.0000 mg | Freq: Once | INTRAMUSCULAR | Status: AC
  Administered 2013-12-18: 25 mg via INTRAVENOUS

## 2013-12-18 MED ORDER — SODIUM CHLORIDE 0.9 % IV SOLN
80.0000 mg/m2 | Freq: Once | INTRAVENOUS | Status: AC
  Administered 2013-12-18: 144 mg via INTRAVENOUS
  Filled 2013-12-18: qty 24

## 2013-12-18 MED ORDER — DEXAMETHASONE SODIUM PHOSPHATE 20 MG/5ML IJ SOLN
INTRAMUSCULAR | Status: AC
  Filled 2013-12-18: qty 5

## 2013-12-18 MED ORDER — DIPHENHYDRAMINE HCL 50 MG/ML IJ SOLN
INTRAMUSCULAR | Status: AC
  Filled 2013-12-18: qty 1

## 2013-12-18 MED ORDER — FAMOTIDINE IN NACL 20-0.9 MG/50ML-% IV SOLN
INTRAVENOUS | Status: AC
  Filled 2013-12-18: qty 50

## 2013-12-18 MED ORDER — SODIUM CHLORIDE 0.9 % IV SOLN
Freq: Once | INTRAVENOUS | Status: AC
  Administered 2013-12-18: 09:00:00 via INTRAVENOUS

## 2013-12-18 MED ORDER — ONDANSETRON 8 MG/50ML IVPB (CHCC)
8.0000 mg | Freq: Once | INTRAVENOUS | Status: AC
  Administered 2013-12-18: 8 mg via INTRAVENOUS

## 2013-12-18 MED ORDER — SODIUM CHLORIDE 0.9 % IV SOLN
250.0000 mL | Freq: Once | INTRAVENOUS | Status: AC
  Administered 2013-12-18: 250 mL via INTRAVENOUS

## 2013-12-18 MED ORDER — INSULIN REGULAR HUMAN 100 UNIT/ML IJ SOLN
2.0000 [IU] | INTRAMUSCULAR | Status: DC
  Filled 2013-12-18: qty 0.08

## 2013-12-18 MED ORDER — ONDANSETRON 8 MG/NS 50 ML IVPB
INTRAVENOUS | Status: AC
  Filled 2013-12-18: qty 8

## 2013-12-18 MED ORDER — DEXAMETHASONE SODIUM PHOSPHATE 20 MG/5ML IJ SOLN
20.0000 mg | Freq: Once | INTRAMUSCULAR | Status: AC
  Administered 2013-12-18: 20 mg via INTRAVENOUS

## 2013-12-18 MED ORDER — FAMOTIDINE IN NACL 20-0.9 MG/50ML-% IV SOLN
20.0000 mg | Freq: Once | INTRAVENOUS | Status: AC
  Administered 2013-12-18: 20 mg via INTRAVENOUS

## 2013-12-18 NOTE — Patient Instructions (Signed)
[facility] Discharge Instructions for Patients Receiving Chemotherapy  Today you received the following chemotherapy agents: Taxol  To help prevent nausea and vomiting after your treatment, we encourage you to take your nausea medication as needed.   If you develop nausea and vomiting that is not controlled by your nausea medication, call the clinic.   BELOW ARE SYMPTOMS THAT SHOULD BE REPORTED IMMEDIATELY:  *FEVER GREATER THAN 100.5 F  *CHILLS WITH OR WITHOUT FEVER  NAUSEA AND VOMITING THAT IS NOT CONTROLLED WITH YOUR NAUSEA MEDICATION  *UNUSUAL SHORTNESS OF BREATH  *UNUSUAL BRUISING OR BLEEDING  TENDERNESS IN MOUTH AND THROAT WITH OR WITHOUT PRESENCE OF ULCERS  *URINARY PROBLEMS  *BOWEL PROBLEMS  UNUSUAL RASH Items with * indicate a potential emergency and should be followed up as soon as possible.  Feel free to call the clinic you have any questions or concerns. The clinic phone number is [phone].   Blood Transfusion Information WHAT IS A BLOOD TRANSFUSION? A transfusion is the replacement of blood or some of its parts. Blood is made up of multiple cells which provide different functions.  Red blood cells carry oxygen and are used for blood loss replacement.  White blood cells fight against infection.  Platelets control bleeding.  Plasma helps clot blood.  Other blood products are available for specialized needs, such as hemophilia or other clotting disorders. BEFORE THE TRANSFUSION  Who gives blood for transfusions?   You may be able to donate blood to be used at a later date on yourself (autologous donation).  Relatives can be asked to donate blood. This is generally not any safer than if you have received blood from a stranger. The same precautions are taken to ensure safety when a relative's blood is donated.  Healthy volunteers who are fully evaluated to make sure their blood is safe. This is blood bank blood. Transfusion therapy is  the safest it has ever been in the practice of medicine. Before blood is taken from a donor, a complete history is taken to make sure that person has no history of diseases nor engages in risky social behavior (examples are intravenous drug use or sexual activity with multiple partners). The donor's travel history is screened to minimize risk of transmitting infections, such as malaria. The donated blood is tested for signs of infectious diseases, such as HIV and hepatitis. The blood is then tested to be sure it is compatible with you in order to minimize the chance of a transfusion reaction. If you or a relative donates blood, this is often done in anticipation of surgery and is not appropriate for emergency situations. It takes many days to process the donated blood. RISKS AND COMPLICATIONS Although transfusion therapy is very safe and saves many lives, the main dangers of transfusion include:   Getting an infectious disease.  Developing a transfusion reaction. This is an allergic reaction to something in the blood you were given. Every precaution is taken to prevent this. The decision to have a blood transfusion has been considered carefully by your caregiver before blood is given. Blood is not given unless the benefits outweigh the risks. AFTER THE TRANSFUSION  Right after receiving a blood transfusion, you will usually feel much better and more energetic. This is especially true if your red blood cells have gotten low (anemic). The transfusion raises the level of the red blood cells which carry oxygen, and this usually causes an energy increase.  The nurse administering the transfusion will monitor you carefully  for complications. HOME CARE INSTRUCTIONS  No special instructions are needed after a transfusion. You may find your energy is better. Speak with your caregiver about any limitations on activity for underlying diseases you may have. SEEK MEDICAL CARE IF:   Your condition is not  improving after your transfusion.  You develop redness or irritation at the intravenous (IV) site. SEEK IMMEDIATE MEDICAL CARE IF:  Any of the following symptoms occur over the next 12 hours:  Shaking chills.  You have a temperature by mouth above 102 F (38.9 C), not controlled by medicine.  Chest, back, or muscle pain.  People around you feel you are not acting correctly or are confused.  Shortness of breath or difficulty breathing.  Dizziness and fainting.  You get a rash or develop hives.  You have a decrease in urine output.  Your urine turns a dark color or changes to pink, red, or brown. Any of the following symptoms occur over the next 10 days:  You have a temperature by mouth above 102 F (38.9 C), not controlled by medicine.  Shortness of breath.  Weakness after normal activity.  The white part of the eye turns yellow (jaundice).  You have a decrease in the amount of urine or are urinating less often.  Your urine turns a dark color or changes to pink, red, or brown. Document Released: 12/02/2000 Document Revised: 02/27/2012 Document Reviewed: 07/21/2008 [facility] Patient Information 2014 [place], [place].

## 2013-12-20 LAB — TYPE AND SCREEN
ABO/RH(D): B POS
Antibody Screen: NEGATIVE
Unit division: 0

## 2013-12-22 ENCOUNTER — Other Ambulatory Visit: Payer: Self-pay | Admitting: Oncology

## 2013-12-23 ENCOUNTER — Telehealth: Payer: Self-pay | Admitting: *Deleted

## 2013-12-23 ENCOUNTER — Other Ambulatory Visit: Payer: Self-pay | Admitting: Oncology

## 2013-12-23 NOTE — Telephone Encounter (Signed)
Message copied by Patton Salles on Mon Dec 23, 2013 10:31 AM ------      Message from: Gordy Levan      Created: Sun Dec 22, 2013  2:03 PM       On dose dense carbo taxol, due day 1 cycle 4 on Jan 7, with my visit also Jan 7. I have been watching peripheral neuropathy in feet, as we may need to change from taxol to taxotere if neuropathy progresses.            Please call her on Jan 5 or Jan 6. If neuropathy in feet more bothersome, we will change chemo starting Jan 7 to q 3 week carbo taxotere. If so, she needs teaching for taxotere by RN over phone, prescription and instructions to use decadron 8 mg every 12 hrs with food x 3 days beginning day prior to taxotere #12 with 2 refills (be sure she knows this is different from decadron with taxol; she may have some decadron at home already), instructions re neulasta day after chemo including possible aches (she has not had gCSF yet), and schedule for [place] taxotere 1-7 with neulasta 1-8.  Be sure Lennis knows to put in orders for carbo taxotere and neulasta if chemo is changed.      Thank you!      Cc Adalberto Ill ------

## 2013-12-23 NOTE — Telephone Encounter (Signed)
Called patient to follow up on neuropathy. Pt states it is the same, no worse.

## 2013-12-25 ENCOUNTER — Telehealth: Payer: Self-pay | Admitting: *Deleted

## 2013-12-25 ENCOUNTER — Ambulatory Visit (HOSPITAL_BASED_OUTPATIENT_CLINIC_OR_DEPARTMENT_OTHER): Payer: BC Managed Care – PPO | Admitting: Oncology

## 2013-12-25 ENCOUNTER — Ambulatory Visit (HOSPITAL_BASED_OUTPATIENT_CLINIC_OR_DEPARTMENT_OTHER): Payer: BC Managed Care – PPO

## 2013-12-25 ENCOUNTER — Other Ambulatory Visit (HOSPITAL_BASED_OUTPATIENT_CLINIC_OR_DEPARTMENT_OTHER): Payer: BC Managed Care – PPO

## 2013-12-25 ENCOUNTER — Encounter: Payer: Self-pay | Admitting: Oncology

## 2013-12-25 ENCOUNTER — Telehealth: Payer: Self-pay | Admitting: Oncology

## 2013-12-25 ENCOUNTER — Other Ambulatory Visit: Payer: Self-pay | Admitting: Oncology

## 2013-12-25 VITALS — BP 151/66 | HR 86 | Temp 97.6°F | Resp 20 | Ht 63.0 in | Wt 159.3 lb

## 2013-12-25 DIAGNOSIS — E119 Type 2 diabetes mellitus without complications: Secondary | ICD-10-CM

## 2013-12-25 DIAGNOSIS — C5702 Malignant neoplasm of left fallopian tube: Secondary | ICD-10-CM

## 2013-12-25 DIAGNOSIS — C5700 Malignant neoplasm of unspecified fallopian tube: Secondary | ICD-10-CM

## 2013-12-25 DIAGNOSIS — E1142 Type 2 diabetes mellitus with diabetic polyneuropathy: Secondary | ICD-10-CM | POA: Insufficient documentation

## 2013-12-25 DIAGNOSIS — G5790 Unspecified mononeuropathy of unspecified lower limb: Secondary | ICD-10-CM

## 2013-12-25 DIAGNOSIS — C549 Malignant neoplasm of corpus uteri, unspecified: Secondary | ICD-10-CM

## 2013-12-25 DIAGNOSIS — Z5111 Encounter for antineoplastic chemotherapy: Secondary | ICD-10-CM

## 2013-12-25 DIAGNOSIS — C541 Malignant neoplasm of endometrium: Secondary | ICD-10-CM

## 2013-12-25 LAB — CBC WITH DIFFERENTIAL/PLATELET
BASO%: 0 % (ref 0.0–2.0)
Basophils Absolute: 0 10*3/uL (ref 0.0–0.1)
EOS%: 0 % (ref 0.0–7.0)
Eosinophils Absolute: 0 10*3/uL (ref 0.0–0.5)
HCT: 28.7 % — ABNORMAL LOW (ref 34.8–46.6)
HGB: 9.6 g/dL — ABNORMAL LOW (ref 11.6–15.9)
LYMPH#: 1.4 10*3/uL (ref 0.9–3.3)
LYMPH%: 14.7 % (ref 14.0–49.7)
MCH: 29.8 pg (ref 25.1–34.0)
MCHC: 33.4 g/dL (ref 31.5–36.0)
MCV: 89.1 fL (ref 79.5–101.0)
MONO#: 0.1 10*3/uL (ref 0.1–0.9)
MONO%: 0.8 % (ref 0.0–14.0)
NEUT#: 8.2 10*3/uL — ABNORMAL HIGH (ref 1.5–6.5)
NEUT%: 84.5 % — ABNORMAL HIGH (ref 38.4–76.8)
Platelets: 159 10*3/uL (ref 145–400)
RBC: 3.22 10*6/uL — ABNORMAL LOW (ref 3.70–5.45)
RDW: 18.5 % — AB (ref 11.2–14.5)
WBC: 9.8 10*3/uL (ref 3.9–10.3)

## 2013-12-25 LAB — WHOLE BLOOD GLUCOSE
GLUCOSE: 149 mg/dL — AB (ref 70–100)
GLUCOSE: 141 mg/dL — AB (ref 70–100)
HRS PC: 3.5 h
HRS PC: 0.5 h

## 2013-12-25 MED ORDER — DIPHENHYDRAMINE HCL 50 MG/ML IJ SOLN
INTRAMUSCULAR | Status: AC
  Filled 2013-12-25: qty 1

## 2013-12-25 MED ORDER — ONDANSETRON 16 MG/50ML IVPB (CHCC)
INTRAVENOUS | Status: AC
  Filled 2013-12-25: qty 16

## 2013-12-25 MED ORDER — SODIUM CHLORIDE 0.9 % IV SOLN
Freq: Once | INTRAVENOUS | Status: AC
  Administered 2013-12-25: 10:00:00 via INTRAVENOUS

## 2013-12-25 MED ORDER — DEXAMETHASONE SODIUM PHOSPHATE 20 MG/5ML IJ SOLN
INTRAMUSCULAR | Status: AC
  Filled 2013-12-25: qty 5

## 2013-12-25 MED ORDER — FAMOTIDINE IN NACL 20-0.9 MG/50ML-% IV SOLN
INTRAVENOUS | Status: AC
  Filled 2013-12-25: qty 50

## 2013-12-25 MED ORDER — DIPHENHYDRAMINE HCL 50 MG/ML IJ SOLN
25.0000 mg | Freq: Once | INTRAMUSCULAR | Status: AC
  Administered 2013-12-25: 25 mg via INTRAVENOUS

## 2013-12-25 MED ORDER — HEPARIN SOD (PORK) LOCK FLUSH 100 UNIT/ML IV SOLN
500.0000 [IU] | Freq: Once | INTRAVENOUS | Status: DC | PRN
  Filled 2013-12-25: qty 5

## 2013-12-25 MED ORDER — SODIUM CHLORIDE 0.9 % IV SOLN
567.0000 mg | Freq: Once | INTRAVENOUS | Status: AC
  Administered 2013-12-25: 570 mg via INTRAVENOUS
  Filled 2013-12-25: qty 57

## 2013-12-25 MED ORDER — FAMOTIDINE IN NACL 20-0.9 MG/50ML-% IV SOLN
20.0000 mg | Freq: Once | INTRAVENOUS | Status: AC
  Administered 2013-12-25: 20 mg via INTRAVENOUS

## 2013-12-25 MED ORDER — INSULIN REGULAR HUMAN 100 UNIT/ML IJ SOLN
2.0000 [IU] | INTRAMUSCULAR | Status: DC
  Filled 2013-12-25: qty 0.08

## 2013-12-25 MED ORDER — ONDANSETRON 16 MG/50ML IVPB (CHCC)
16.0000 mg | Freq: Once | INTRAVENOUS | Status: AC
  Administered 2013-12-25: 16 mg via INTRAVENOUS

## 2013-12-25 MED ORDER — SODIUM CHLORIDE 0.9 % IJ SOLN
10.0000 mL | INTRAMUSCULAR | Status: DC | PRN
  Filled 2013-12-25: qty 10

## 2013-12-25 MED ORDER — DEXAMETHASONE SODIUM PHOSPHATE 20 MG/5ML IJ SOLN
20.0000 mg | Freq: Once | INTRAMUSCULAR | Status: AC
  Administered 2013-12-25: 20 mg via INTRAVENOUS

## 2013-12-25 MED ORDER — SODIUM CHLORIDE 0.9 % IV SOLN
80.0000 mg/m2 | Freq: Once | INTRAVENOUS | Status: AC
  Administered 2013-12-25: 144 mg via INTRAVENOUS
  Filled 2013-12-25: qty 24

## 2013-12-25 NOTE — Patient Instructions (Signed)
[facility] Discharge Instructions for Patients Receiving Chemotherapy  Today you received the following chemotherapy agents: taxol, carboplatin  To help prevent nausea and vomiting after your treatment, we encourage you to take your nausea medication.  Take it as often as prescribed.     If you develop nausea and vomiting that is not controlled by your nausea medication, call the clinic. If it is after clinic hours your family physician or the after hours number for the clinic or go to the Emergency Department.   BELOW ARE SYMPTOMS THAT SHOULD BE REPORTED IMMEDIATELY:  *FEVER GREATER THAN 100.5 F  *CHILLS WITH OR WITHOUT FEVER  NAUSEA AND VOMITING THAT IS NOT CONTROLLED WITH YOUR NAUSEA MEDICATION  *UNUSUAL SHORTNESS OF BREATH  *UNUSUAL BRUISING OR BLEEDING  TENDERNESS IN MOUTH AND THROAT WITH OR WITHOUT PRESENCE OF ULCERS  *URINARY PROBLEMS  *BOWEL PROBLEMS  UNUSUAL RASH Items with * indicate a potential emergency and should be followed up as soon as possible.  Feel free to call the clinic you have any questions or concerns. The clinic phone number is [phone].   I have been informed and understand all the instructions given to me. I know to contact the clinic, my physician, or go to the Emergency Department if any problems should occur. I do not have any questions at this time, but understand that I may call the clinic during office hours   should I have any questions or need assistance in obtaining follow up care.    __________________________________________  _____________  __________ Signature of Patient or Authorized Representative            Date                   Time    __________________________________________ Nurse's Signature    

## 2013-12-25 NOTE — Telephone Encounter (Signed)
Per staff message and POF I have scheduled appts.  JMW  

## 2013-12-25 NOTE — Progress Notes (Signed)
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 She has not needed gCSF, but was transfused one unit PRBCs on 12-18-13 for Hgb down to 7.6. Iron studies were adequate on 12-18-13. No PAC.  RN spoke with her by phone earlier this week, in case we needed to change regimen beginning today, however patient did not indicate that the peripheral neuropathy was worse or more bothersome. She reiterates this status today, still describing "tightness" in distal feet, able to ambulate without difficulty and no discomfort at night. She has no neuropathy symptoms in hands. Fatigue is stable. She is eating and drinking fluids. Bowels are moving regularly. Blood sugars are ~ 150 other than after steroids, up to 340 this AM after taxol premed steroids.  Patient has been contacted about follow up of recent mammograms done at [place], tho she does not recall which breast had finding of concern. I will request copy of the mammogram report.   ONCOLOGIC HISTORY Oncology History   Stage 1 grade 1 at [facility] BSO and pelvic lymph node dissection 09-17-13, with <50% myometrial invasion and no lymphovascular space invasion     Endometrial cancer   03/07/2013 Initial Diagnosis Endometrial cancer   Patient presented to Dr Dellis Filbert with ~ 6 months of vaginal spotting. Endometrial biopsy in Feb 2014 found grade 1 endometrial carcinoma, with referral made to Dr Alycia Rossetti. At that time the  patient had not seen physician for diabetes in 4 years and was not checking blood sugars at home, tho she did continue to take insulin (patient states that she "did not need prescription" for the insulin); lack of medical care largely seems due to no insurance at that time. CT AP in [place] system 02-26-2013 showed low attenuation in uterus but otherwise no findings suggesting metastatic disease and adnexa not remarkable. D and C was done and Mirena IUD placed by Dr Skeet Latch on 03-26-2013, with pathology [id number] endometrioid adenocarcinoma with squamous differentiation, grade 1. Patient then established with Dr Coletta Memos and diabetes was controlled adequately to allow surgery by 09-17-13, this robotic [facility], BSO and bilateral pelvic node dissection by Dr Skeet Latch. 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 Post operative course was uncomplicated. Dr Skeet Latch recommended 6 cycles of taxol carboplatin particularly as omentectomy and periaortic nodes were not included in the surgical staging; dose dense regimen was chosen due to diabetic neuropathy and tolerance concerns. Cycle 1 day 1 was given 10-16-13.  Review of systems as above, also: No bleeding, no fever or symptoms of infection. No increased SOB with usual activities. No abdominal or pelvic pain. Bladder ok. Did sleep last pm. Remainder of 10 point Review of Systems negative.  Objective:  Vital signs in last 24 hours:  BP 151/66  Pulse 86  Temp(Src)  97.6 F (36.4 C) (Oral)  Resp 20  Ht 5\' 3"  (1.6 m)  Wt 159 lb 4.8 oz (72.258 kg)  BMI 28.23 kg/m2  SpO2 100% Weight is down 3 lbs.  Alert, oriented and appropriate.  Ambulatory without difficulty.  Alopecia  HEENT:PERRL, sclerae not icteric. Oral mucosa moist without lesions, posterior pharynx clear. Mucous membranes pale Neck supple. No JVD.  Lymphatics:no cervical,suraclavicular, axillary or inguinal adenopathy Resp: clear to auscultation bilaterally and normal percussion bilaterally Cardio: regular rate and rhythm. No gallop. GI: soft, nontender, not distended, no mass or organomegaly. Normally active bowel sounds. Surgical incision not remarkable. Musculoskeletal/ Extremities: without pitting edema, cords, tenderness Neuro: peripheral sensory neuropathy distal 1/2 feet bilaterally. Otherwise nonfocal Skin without rash, ecchymosis, petechiae Breasts: bilaterally without dominant mass, skin or nipple findings. Axillae benign. No central catheter  Lab Results:  Results for orders placed in visit on 12/25/13  CBC WITH DIFFERENTIAL      Result Value Range   WBC 9.8  3.9 - 10.3 10e3/uL   NEUT# 8.2 (*) 1.5 - 6.5 10e3/uL   HGB 9.6 (*) 11.6 - 15.9 g/dL   HCT 28.7 (*) 34.8 - 46.6 %   Platelets 159  145 - 400 10e3/uL   MCV 89.1  79.5 - 101.0 fL   MCH 29.8  25.1 - 34.0 pg   MCHC 33.4  31.5 - 36.0 g/dL   RBC 3.22 (*) 3.70 - 5.45 10e6/uL   RDW 18.5 (*) 11.2 - 14.5 %   lymph# 1.4  0.9 - 3.3 10e3/uL   MONO# 0.1  0.1 - 0.9 10e3/uL   Eosinophils Absolute 0.0  0.0 - 0.5 10e3/uL   Basophils Absolute 0.0  0.0 - 0.1 10e3/uL   NEUT% 84.5 (*) 38.4 - 76.8 %   LYMPH% 14.7  14.0 - 49.7 %   MONO% 0.8  0.0 - 14.0 %   EOS% 0.0  0.0 - 7.0 %   BASO% 0.0  0.0 - 2.0 %  WHOLE BLOOD GLUCOSE      Result Value Range   Glucose 141 (*) 70 - 100 mg/dL   HRS PC 0.5      Last CMET from 12-16-14 had BUN 16 and creat 1.0, otherwise normal except glucose 327 on steroids and albumin 3.4  Studies/Results Patient is to have follow up mammogram at [place], reportedly due to a concerning finding on routine mammograms done there in ~ Dec 2014. I have not received copy  of the report (this done thru Dr Chales Abrahams office).  Medications: I have reviewed the patient's current medications.  DISCUSSION: we have again discussed option of changing present taxol to taxotere, which would be used once every 3 weeks with steroids x 3 days around each treatment, to complete total 6 cycles of chemotherapy. The taxotere requires neulasta after each treatment. I suggested changing to taxotere for cycles 5 and 6, and offered teaching for the taxotere now. Patient is reluctant to change treatment and requests continuing same [place] with weekly taxol unless clear worsening of the neuropathy symptoms. She mentions that she "takes too many shots already" to add neulasta injection. She agrees to let me know at any time prior to next scheduled visit if the peripheral neuropathy is worse. She has not had taxotere teaching.  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 For now with stable peripheral neuropathy symptoms will continue with low dose weekly taxol at patient's request.  2.stage 1 grade 1 endometrial carcinoma: treated surgically, no adjuvant chemo or RT recommended  3.diabetes x 20 years: better controlled presently, but has not been managed closely for years until past several months.  Follow CBGs at this office with Rx due to premed steroids, cover with SS regular insulin + additional IVF as appropriate.  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 May need to change taxol to taxotere as above. I will see her at least Jan 28 or sooner if needed. CBC and whole blood glucose with each chemo and chemistries with day 15 cycles 4 and 5 on present regimen. 6. Further mammogram views reportedly requested by [facility]. I have asked [facility] to request report of the recent mammograms for this EMR. 7.dental concerns: seen by [place], patient declined intervention now  8. Flu vaccine done 9. Marked anxiety at my initial visits,improved  10. Restlessness to benadryl: premed dose decreased  11.peripheral IV access intermittently difficult but still has been obtained.  12. 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 Improved after PRBCs 12-18-13.  13.previous noncompliance due primarily to lack of medical insurance then    Patient is in agreement with plan above.   Calie Buttrey P, MD   12/25/2013, 5:11 PM

## 2013-12-25 NOTE — Patient Instructions (Signed)
Call Dr Marko Plume or her nurse if your feet are more numb or more bothersome before next doctor visit.  OK to have the follow up mammograms as [facility] requests

## 2013-12-25 NOTE — Telephone Encounter (Signed)
, °

## 2013-12-30 ENCOUNTER — Other Ambulatory Visit: Payer: Self-pay | Admitting: Oncology

## 2013-12-30 NOTE — Progress Notes (Signed)
Medical Oncology  Report of bilateral screening mammograms done [facility] 10-21-2013 requested and received from Dr Bobbye Charleston office. Report states further imaging suggested for calcifications in left and right breasts.  Report to be scanned into this EMR. Patient aware of recommendation for additional imaging, from her conversation with me at last office visit.  Godfrey Pick, MD

## 2014-01-01 ENCOUNTER — Other Ambulatory Visit: Payer: Self-pay | Admitting: Oncology

## 2014-01-01 ENCOUNTER — Ambulatory Visit (HOSPITAL_BASED_OUTPATIENT_CLINIC_OR_DEPARTMENT_OTHER): Payer: BC Managed Care – PPO

## 2014-01-01 ENCOUNTER — Other Ambulatory Visit (HOSPITAL_BASED_OUTPATIENT_CLINIC_OR_DEPARTMENT_OTHER): Payer: BC Managed Care – PPO

## 2014-01-01 DIAGNOSIS — E119 Type 2 diabetes mellitus without complications: Secondary | ICD-10-CM

## 2014-01-01 DIAGNOSIS — C5700 Malignant neoplasm of unspecified fallopian tube: Secondary | ICD-10-CM

## 2014-01-01 DIAGNOSIS — Z5111 Encounter for antineoplastic chemotherapy: Secondary | ICD-10-CM

## 2014-01-01 DIAGNOSIS — C549 Malignant neoplasm of corpus uteri, unspecified: Secondary | ICD-10-CM

## 2014-01-01 DIAGNOSIS — C5702 Malignant neoplasm of left fallopian tube: Secondary | ICD-10-CM

## 2014-01-01 LAB — WHOLE BLOOD GLUCOSE
Glucose: 190 mg/dL — ABNORMAL HIGH (ref 70–100)
HRS PC: 1 h

## 2014-01-01 LAB — CBC WITH DIFFERENTIAL/PLATELET
BASO%: 0 % (ref 0.0–2.0)
BASOS ABS: 0 10*3/uL (ref 0.0–0.1)
EOS%: 0 % (ref 0.0–7.0)
Eosinophils Absolute: 0 10*3/uL (ref 0.0–0.5)
HEMATOCRIT: 26.4 % — AB (ref 34.8–46.6)
HGB: 8.9 g/dL — ABNORMAL LOW (ref 11.6–15.9)
LYMPH#: 0.9 10*3/uL (ref 0.9–3.3)
LYMPH%: 19.8 % (ref 14.0–49.7)
MCH: 30 pg (ref 25.1–34.0)
MCHC: 33.7 g/dL (ref 31.5–36.0)
MCV: 88.9 fL (ref 79.5–101.0)
MONO#: 0 10*3/uL — ABNORMAL LOW (ref 0.1–0.9)
MONO%: 0.7 % (ref 0.0–14.0)
NEUT#: 3.6 10*3/uL (ref 1.5–6.5)
NEUT%: 79.5 % — ABNORMAL HIGH (ref 38.4–76.8)
Platelets: 163 10*3/uL (ref 145–400)
RBC: 2.97 10*6/uL — ABNORMAL LOW (ref 3.70–5.45)
RDW: 18 % — ABNORMAL HIGH (ref 11.2–14.5)
WBC: 4.5 10*3/uL (ref 3.9–10.3)
nRBC: 0 % (ref 0–0)

## 2014-01-01 MED ORDER — METHYLPREDNISOLONE SODIUM SUCC 125 MG IJ SOLR: 125.0000 mg | Freq: Once | INTRAMUSCULAR | Status: DC | PRN

## 2014-01-01 MED ORDER — EPINEPHRINE HCL 1 MG/ML IJ SOLN: 0.5000 mg | Freq: Once | INTRAMUSCULAR | Status: DC | PRN

## 2014-01-01 MED ORDER — INSULIN REGULAR HUMAN 100 UNIT/ML IJ SOLN
2.0000 [IU] | INTRAMUSCULAR | Status: DC
  Administered 2014-01-01: 2 [IU] via SUBCUTANEOUS
  Filled 2014-01-01: qty 0.02

## 2014-01-01 MED ORDER — SODIUM CHLORIDE 0.9 % IV SOLN
Freq: Once | INTRAVENOUS | Status: AC
  Administered 2014-01-01: 10:00:00 via INTRAVENOUS

## 2014-01-01 MED ORDER — FAMOTIDINE IN NACL 20-0.9 MG/50ML-% IV SOLN
INTRAVENOUS | Status: AC
Start: 2014-01-01 — End: 2014-01-01
  Filled 2014-01-01: qty 50

## 2014-01-01 MED ORDER — ALBUTEROL SULFATE (2.5 MG/3ML) 0.083% IN NEBU
2.5000 mg | INHALATION_SOLUTION | Freq: Once | RESPIRATORY_TRACT | Status: DC | PRN
  Filled 2014-01-01: qty 3

## 2014-01-01 MED ORDER — HEPARIN SOD (PORK) LOCK FLUSH 100 UNIT/ML IV SOLN
250.0000 [IU] | Freq: Once | INTRAVENOUS | Status: DC | PRN
  Filled 2014-01-01: qty 5

## 2014-01-01 MED ORDER — SODIUM CHLORIDE 0.9 % IV SOLN: Freq: Once | INTRAVENOUS | Status: DC | PRN

## 2014-01-01 MED ORDER — ONDANSETRON 8 MG/NS 50 ML IVPB
INTRAVENOUS | Status: AC
  Filled 2014-01-01: qty 8

## 2014-01-01 MED ORDER — FAMOTIDINE IN NACL 20-0.9 MG/50ML-% IV SOLN
20.0000 mg | Freq: Once | INTRAVENOUS | Status: AC
  Administered 2014-01-01: 20 mg via INTRAVENOUS

## 2014-01-01 MED ORDER — FAMOTIDINE IN NACL 20-0.9 MG/50ML-% IV SOLN: 20.0000 mg | Freq: Once | INTRAVENOUS | Status: DC | PRN

## 2014-01-01 MED ORDER — COLD PACK MISC ONCOLOGY
1.0000 | Freq: Once | Status: DC | PRN
  Filled 2014-01-01: qty 1

## 2014-01-01 MED ORDER — DIPHENHYDRAMINE HCL 50 MG/ML IJ SOLN: 50.0000 mg | Freq: Once | INTRAMUSCULAR | Status: DC | PRN

## 2014-01-01 MED ORDER — HEPARIN SOD (PORK) LOCK FLUSH 100 UNIT/ML IV SOLN
500.0000 [IU] | Freq: Once | INTRAVENOUS | Status: DC | PRN
  Filled 2014-01-01: qty 5

## 2014-01-01 MED ORDER — DIPHENHYDRAMINE HCL 50 MG/ML IJ SOLN
INTRAMUSCULAR | Status: AC
Start: 2014-01-01 — End: 2014-01-01
  Filled 2014-01-01: qty 1

## 2014-01-01 MED ORDER — DEXAMETHASONE SODIUM PHOSPHATE 20 MG/5ML IJ SOLN
INTRAMUSCULAR | Status: AC
  Filled 2014-01-01: qty 5

## 2014-01-01 MED ORDER — EPINEPHRINE HCL 0.1 MG/ML IJ SOLN: 0.2500 mg | Freq: Once | INTRAMUSCULAR | Status: DC | PRN

## 2014-01-01 MED ORDER — SODIUM CHLORIDE 0.9 % IV SOLN
80.0000 mg/m2 | Freq: Once | INTRAVENOUS | Status: AC
  Administered 2014-01-01: 144 mg via INTRAVENOUS
  Filled 2014-01-01: qty 24

## 2014-01-01 MED ORDER — SODIUM CHLORIDE 0.9 % IJ SOLN
10.0000 mL | INTRAMUSCULAR | Status: DC | PRN
  Filled 2014-01-01: qty 10

## 2014-01-01 MED ORDER — DEXAMETHASONE SODIUM PHOSPHATE 20 MG/5ML IJ SOLN
20.0000 mg | Freq: Once | INTRAMUSCULAR | Status: AC
  Administered 2014-01-01: 20 mg via INTRAVENOUS

## 2014-01-01 MED ORDER — SODIUM CHLORIDE 0.9 % IJ SOLN
3.0000 mL | INTRAMUSCULAR | Status: DC | PRN
  Filled 2014-01-01: qty 10

## 2014-01-01 MED ORDER — DIPHENHYDRAMINE HCL 50 MG/ML IJ SOLN: 25.0000 mg | Freq: Once | INTRAMUSCULAR | Status: DC | PRN

## 2014-01-01 MED ORDER — DIPHENHYDRAMINE HCL 50 MG/ML IJ SOLN
25.0000 mg | Freq: Once | INTRAMUSCULAR | Status: AC
  Administered 2014-01-01: 25 mg via INTRAVENOUS

## 2014-01-01 MED ORDER — ALTEPLASE 2 MG IJ SOLR
2.0000 mg | Freq: Once | INTRAMUSCULAR | Status: DC | PRN
  Filled 2014-01-01: qty 2

## 2014-01-01 MED ORDER — ONDANSETRON 8 MG/50ML IVPB (CHCC)
8.0000 mg | Freq: Once | INTRAVENOUS | Status: AC
  Administered 2014-01-01: 8 mg via INTRAVENOUS

## 2014-01-01 NOTE — Patient Instructions (Signed)
[facility] Discharge Instructions for Patients Receiving Chemotherapy  Today you received the following chemotherapy agents Taxol  To help prevent nausea and vomiting after your treatment, we encourage you to take your nausea medication    If you develop nausea and vomiting that is not controlled by your nausea medication, call the clinic.   BELOW ARE SYMPTOMS THAT SHOULD BE REPORTED IMMEDIATELY:  *FEVER GREATER THAN 100.5 F  *CHILLS WITH OR WITHOUT FEVER  NAUSEA AND VOMITING THAT IS NOT CONTROLLED WITH YOUR NAUSEA MEDICATION  *UNUSUAL SHORTNESS OF BREATH  *UNUSUAL BRUISING OR BLEEDING  TENDERNESS IN MOUTH AND THROAT WITH OR WITHOUT PRESENCE OF ULCERS  *URINARY PROBLEMS  *BOWEL PROBLEMS  UNUSUAL RASH Items with * indicate a potential emergency and should be followed up as soon as possible.  Feel free to call the clinic you have any questions or concerns. The clinic phone number is [phone].    

## 2014-01-08 ENCOUNTER — Ambulatory Visit: Payer: BC Managed Care – PPO

## 2014-01-08 ENCOUNTER — Other Ambulatory Visit (HOSPITAL_BASED_OUTPATIENT_CLINIC_OR_DEPARTMENT_OTHER): Payer: BC Managed Care – PPO

## 2014-01-08 DIAGNOSIS — C5700 Malignant neoplasm of unspecified fallopian tube: Secondary | ICD-10-CM

## 2014-01-08 DIAGNOSIS — C5702 Malignant neoplasm of left fallopian tube: Secondary | ICD-10-CM

## 2014-01-08 DIAGNOSIS — E119 Type 2 diabetes mellitus without complications: Secondary | ICD-10-CM

## 2014-01-08 LAB — CBC WITH DIFFERENTIAL/PLATELET
BASO%: 0 % (ref 0.0–2.0)
Basophils Absolute: 0 10*3/uL (ref 0.0–0.1)
EOS%: 0 % (ref 0.0–7.0)
Eosinophils Absolute: 0 10*3/uL (ref 0.0–0.5)
HCT: 23.6 % — ABNORMAL LOW (ref 34.8–46.6)
HGB: 7.9 g/dL — ABNORMAL LOW (ref 11.6–15.9)
LYMPH%: 22.8 % (ref 14.0–49.7)
MCH: 29.9 pg (ref 25.1–34.0)
MCHC: 33.5 g/dL (ref 31.5–36.0)
MCV: 89.4 fL (ref 79.5–101.0)
MONO#: 0.1 10*3/uL (ref 0.1–0.9)
MONO%: 2.5 % (ref 0.0–14.0)
NEUT#: 3.9 10*3/uL (ref 1.5–6.5)
NEUT%: 74.7 % (ref 38.4–76.8)
Platelets: 80 10*3/uL — ABNORMAL LOW (ref 145–400)
RBC: 2.64 10*6/uL — AB (ref 3.70–5.45)
RDW: 17.5 % — AB (ref 11.2–14.5)
WBC: 5.2 10*3/uL (ref 3.9–10.3)
lymph#: 1.2 10*3/uL (ref 0.9–3.3)

## 2014-01-08 LAB — WHOLE BLOOD GLUCOSE
GLUCOSE: 352 mg/dL — AB (ref 70–100)
HRS PC: 12 h

## 2014-01-08 NOTE — Progress Notes (Signed)
Pt in today for Taxol treatment. Platelets-80,Hgb,7.9. Pt denies any signs/ symptoms of anemia. Pt also denies any signs/symptoms of bleeding. Dr. Marko Plume notified of pt's counts. Per Dr Marko Plume patient will not receive Taxol treatment today. Pt states she does not feel like she needs blood today. She was encouraged to call clinic if she develops any signs/ symptoms of anemia, but not limited to shortness of breath,light headedness,extreme fatigue,etc.  Pt also advised to call clinic if she develops any signs/symptoms of bleeding such as excessive bruising, vaginal spotting, rectal bleeding,nose bleeds, etc. Pt verbalized understanding of all and questions answered. Makayla Buchanan states she will be at her next apt.with Dr Marko Plume, Wednesday January 28,2015 at 8:30. Pt knows to call clinic if she has any additional questions or concerns.

## 2014-01-09 ENCOUNTER — Other Ambulatory Visit: Payer: Self-pay | Admitting: Oncology

## 2014-01-09 DIAGNOSIS — C5700 Malignant neoplasm of unspecified fallopian tube: Secondary | ICD-10-CM

## 2014-01-15 ENCOUNTER — Telehealth: Payer: Self-pay | Admitting: *Deleted

## 2014-01-15 ENCOUNTER — Encounter: Payer: Self-pay | Admitting: Oncology

## 2014-01-15 ENCOUNTER — Ambulatory Visit: Payer: Self-pay

## 2014-01-15 ENCOUNTER — Ambulatory Visit (HOSPITAL_BASED_OUTPATIENT_CLINIC_OR_DEPARTMENT_OTHER): Payer: BC Managed Care – PPO | Admitting: Oncology

## 2014-01-15 ENCOUNTER — Other Ambulatory Visit (HOSPITAL_BASED_OUTPATIENT_CLINIC_OR_DEPARTMENT_OTHER): Payer: BC Managed Care – PPO

## 2014-01-15 ENCOUNTER — Telehealth: Payer: Self-pay | Admitting: Oncology

## 2014-01-15 VITALS — BP 145/66 | HR 111 | Temp 97.9°F | Resp 20 | Ht 63.0 in | Wt 161.5 lb

## 2014-01-15 DIAGNOSIS — F411 Generalized anxiety disorder: Secondary | ICD-10-CM

## 2014-01-15 DIAGNOSIS — D6481 Anemia due to antineoplastic chemotherapy: Secondary | ICD-10-CM

## 2014-01-15 DIAGNOSIS — C5702 Malignant neoplasm of left fallopian tube: Secondary | ICD-10-CM

## 2014-01-15 DIAGNOSIS — G609 Hereditary and idiopathic neuropathy, unspecified: Secondary | ICD-10-CM

## 2014-01-15 DIAGNOSIS — E119 Type 2 diabetes mellitus without complications: Secondary | ICD-10-CM

## 2014-01-15 DIAGNOSIS — C5700 Malignant neoplasm of unspecified fallopian tube: Secondary | ICD-10-CM

## 2014-01-15 DIAGNOSIS — D638 Anemia in other chronic diseases classified elsewhere: Secondary | ICD-10-CM

## 2014-01-15 DIAGNOSIS — T451X5A Adverse effect of antineoplastic and immunosuppressive drugs, initial encounter: Secondary | ICD-10-CM

## 2014-01-15 LAB — CBC WITH DIFFERENTIAL/PLATELET
BASO%: 0.2 % (ref 0.0–2.0)
BASOS ABS: 0 10*3/uL (ref 0.0–0.1)
EOS ABS: 0 10*3/uL (ref 0.0–0.5)
EOS%: 0 % (ref 0.0–7.0)
HCT: 23.9 % — ABNORMAL LOW (ref 34.8–46.6)
HEMOGLOBIN: 8 g/dL — AB (ref 11.6–15.9)
LYMPH#: 1 10*3/uL (ref 0.9–3.3)
LYMPH%: 16 % (ref 14.0–49.7)
MCH: 30.5 pg (ref 25.1–34.0)
MCHC: 33.5 g/dL (ref 31.5–36.0)
MCV: 91.2 fL (ref 79.5–101.0)
MONO#: 0.1 10*3/uL (ref 0.1–0.9)
MONO%: 1.8 % (ref 0.0–14.0)
NEUT%: 82 % — ABNORMAL HIGH (ref 38.4–76.8)
NEUTROS ABS: 4.9 10*3/uL (ref 1.5–6.5)
Platelets: 79 10*3/uL — ABNORMAL LOW (ref 145–400)
RBC: 2.62 10*6/uL — AB (ref 3.70–5.45)
RDW: 19.4 % — AB (ref 11.2–14.5)
WBC: 6 10*3/uL (ref 3.9–10.3)

## 2014-01-15 LAB — WHOLE BLOOD GLUCOSE
Glucose: 340 mg/dL — ABNORMAL HIGH (ref 70–100)
HRS PC: 0.5 h

## 2014-01-15 NOTE — Telephone Encounter (Signed)
I have added more time to her treatment on 2/4  jmw

## 2014-01-15 NOTE — Progress Notes (Signed)
OFFICE PROGRESS NOTE   01/15/2014   Physicians:Wendy Brewster, ML Lavoie, D.Lucianne Lei, K.Heckler   INTERVAL HISTORY:   Patient seen, alone for visit, in scheduled follow up of chemotherapy in process for IA clinically unstaged low grade fallopian carcinoma. She received day 1 cycle 4 on 12-25-13 and day 8 cycle 4 on 01-01-14, but day 15 was held on 01-08-14 due to platelets 80k. Note carbo AUC was used at 4 on cycle 2 and increased back to 6 with cycles 5 and 6. Patient was also more anemic on 1-21 with hemoglobin 7.9, but declined transfusion PRBCs then.  She notices "legs tired" when she walks longer distances, ok thru office this am, but denies more symptomatic anemia otherwise. She has had no bleeding. She feels "paper between my toes and hands cold sometimes" unchanged. Blood sugars are in reasonable range other than with steroid premedication for taxol; Dr Coletta Memos has requested hemoglobin A1c, which we are glad to draw next week but likely will be higher due to intermittent steroids used with chemo since 10-16-13. She is otherwise feeling ok, without nausea, bowels moving, no fever or symptoms of infection.  ONCOLOGIC HISTORY Oncology History   Stage 1 grade 1 at [facility] BSO and pelvic lymph node dissection 09-17-13, with <50% myometrial invasion and no lymphovascular space invasion     Endometrial cancer   03/07/2013 Initial Diagnosis Endometrial cancer  Patient presented to Dr Dellis Filbert with ~ 6 months of vaginal spotting. Endometrial biopsy in Feb 2014 found grade 1 endometrial carcinoma, with referral made to Dr Alycia Rossetti. At that time the patient had not seen physician for diabetes in 4 years and was not checking blood sugars at home, tho she did continue to take insulin (patient states that she "did not need prescription" for the insulin); lack of medical care largely seems due to no insurance at that time. CT AP in [place] system 02-26-2013 showed low attenuation in uterus but  otherwise no findings suggesting metastatic disease and adnexa not remarkable. D and C was done and Mirena IUD placed by Dr Skeet Latch on 03-26-2013, with pathology [id number] endometrioid adenocarcinoma with squamous differentiation, grade 1. Patient then established with Dr Coletta Memos and diabetes was controlled adequately to allow surgery by 09-17-13, this robotic [facility], BSO and bilateral pelvic node dissection by Dr Skeet Latch. 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 Post operative course was uncomplicated. Dr Skeet Latch recommended 6 cycles of taxol carboplatin particularly as omentectomy and periaortic nodes were not included in the surgical staging; dose dense regimen was chosen due to diabetic neuropathy and tolerance concerns. Cycle 1 day 1 was given 10-16-13. She was transfused PRBCs 12-18-13 for Hgb 7.6.   Review of systems as above, also: No SOB at rest, no other respiratory symptoms. No pain. Remainder of 10 point Review of Systems negative.  New granddaughter born 01-04-14.  Objective:  Vital signs in last 24 hours:  BP 145/66  Pulse 111  Temp(Src) 97.9 F (36.6 C) (Oral)  Resp 20  Ht 5\' 3"  (1.6 m)  Wt 161 lb 8 oz (73.256 kg)  BMI 28.62 kg/m2 weight up 2 lbs.  Alert, oriented and appropriate. Ambulatory without difficulty.  Alopecia. Respirations not labored RA  HEENT:PERRL, sclerae not icteric. Oral mucosa  moist without lesions, posterior pharynx clear.  Neck supple. No JVD.  Lymphatics:no cervical,suraclavicular, axillary or inguinal adenopathy Resp: clear to auscultation bilaterally and normal percussion bilaterally Cardio: regular rate and rhythm. No  gallop. GI: soft, nontender, not distended, no mass or organomegaly. Normally active bowel sounds. Surgical incision not remarkable. Musculoskeletal/ Extremities: without pitting edema, cords, tenderness. Hands/ fingers warm to touch  Neuro:  peripheral neuropathy distal feet stable. Otherwise nonfocal Skin without rash, ecchymosis, petechiae Breasts: without dominant mass, skin or nipple findings.  Portacath-without erythema or tenderness  Lab Results:  Results for orders placed in visit on 01/15/14  CBC WITH DIFFERENTIAL      Result Value Range   WBC 6.0  3.9 - 10.3 10e3/uL   NEUT# 4.9  1.5 - 6.5 10e3/uL   HGB 8.0 (*) 11.6 - 15.9 g/dL   HCT 23.9 (*) 34.8 - 46.6 %   Platelets 79 (*) 145 - 400 10e3/uL   MCV 91.2  79.5 - 101.0 fL   MCH 30.5  25.1 - 34.0 pg   MCHC 33.5  31.5 - 36.0 g/dL   RBC 2.62 (*) 3.70 - 5.45 10e6/uL   RDW 19.4 (*) 11.2 - 14.5 %   lymph# 1.0  0.9 - 3.3 10e3/uL   MONO# 0.1  0.1 - 0.9 10e3/uL   Eosinophils Absolute 0.0  0.0 - 0.5 10e3/uL   Basophils Absolute 0.0  0.0 - 0.1 10e3/uL   NEUT% 82.0 (*) 38.4 - 76.8 %   LYMPH% 16.0  14.0 - 49.7 %   MONO% 1.8  0.0 - 14.0 %   EOS% 0.0  0.0 - 7.0 %   BASO% 0.2  0.0 - 2.0 %  WHOLE BLOOD GLUCOSE      Result Value Range   Glucose 340 (*) 70 - 100 mg/dL   HRS PC 0.5      NOTE chemistries ordered for today were not drawn when chemo was cancelled. These have been reordered with CBC for 01-21-14 Norma Fredrickson order cannot be signed until chemistries resulted).  Studies/Results:  No results found.  Mammogram report from [facility] 10-21-13 not yet scanned into this EMR, see my note 12-30-2013 with report information.   Medications: I have reviewed the patient's current medications.  DISCUSSION: we have discussed skipping treatments when counts too low from cumulative effects of the chemotherapy. With platelets <100k, cannot have day 1 cycle 5 today as planned. Will check CBC on 01-21-14 and let her know if ok to treat on  01-22-14 (ok if ANC >=1.5 and plt >=100k) so that she does not take steroids if counts too low. Will decrease carboplatin for treatments 5 and 6. Will continue taxol as long as peripheral neuropathy symptoms are no worse/ not bothersome. She understands that she may well need PRBCs to get thru completion of treatment.  We have discussed return to work, which will need to delay at least a few weeks after completion of treatment. She cares for infants to age 30, tells me that she should be able to return to work for less than full days initially.  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 For now with stable peripheral  neuropathy symptoms will continue with low dose weekly taxol at patient's request. Repeat CBC and chemistries 01-21-14, parameters as above for treatment 2-4. I will see her 2-11 with day 8 cycle 5 that day. 2.stage 1 grade 1 endometrial carcinoma: treated surgically, no adjuvant chemo or RT recommended  3.diabetes x 20 years: better controlled presently, but has not been managed closely for years until past several months. Follow CBGs at this office with Rx due to premed steroids, cover with SS regular insulin + additional IVF as appropriate.  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 Substitute taxotere for taxol if neuropathy worsens. CBC and whole blood glucose with each chemo and chemistries prior to each carboplatin. 6. Further mammogram views  reportedly requested by [facility]. I have not received report of follow up studies. 7.dental concerns: seen by [place], patient declined intervention now  8. Flu vaccine done  9. Marked anxiety at my initial visits,improved  10. Restlessness to benadryl: premed dose decreased  11.peripheral IV access intermittently difficult but still has been obtained.  12. 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 Last PRBCs 12-18-13, patient aware that she may need further transfusion to complete chemotherapy 13.previous noncompliance due primarily to lack of medical insurance then     Patient understands discussion and is in agreement with plan.    Terriann Difonzo P, MD   01/15/2014, 8:31 AM

## 2014-01-15 NOTE — Patient Instructions (Signed)
Call if any persistent bleeding or if more short of breath before next visit.  OK to hold baby as long as you or she are not ill. Do not change diapers.

## 2014-01-21 ENCOUNTER — Other Ambulatory Visit: Payer: Self-pay | Admitting: Oncology

## 2014-01-21 ENCOUNTER — Other Ambulatory Visit (HOSPITAL_BASED_OUTPATIENT_CLINIC_OR_DEPARTMENT_OTHER): Payer: BC Managed Care – PPO

## 2014-01-21 ENCOUNTER — Telehealth: Payer: Self-pay | Admitting: *Deleted

## 2014-01-21 DIAGNOSIS — C5700 Malignant neoplasm of unspecified fallopian tube: Secondary | ICD-10-CM

## 2014-01-21 DIAGNOSIS — E119 Type 2 diabetes mellitus without complications: Secondary | ICD-10-CM

## 2014-01-21 LAB — COMPREHENSIVE METABOLIC PANEL (CC13)
ALK PHOS: 71 U/L (ref 40–150)
ALT: 12 U/L (ref 0–55)
AST: 12 U/L (ref 5–34)
Albumin: 3.8 g/dL (ref 3.5–5.0)
Anion Gap: 11 mEq/L (ref 3–11)
BUN: 11.6 mg/dL (ref 7.0–26.0)
CO2: 24 mEq/L (ref 22–29)
Calcium: 9.5 mg/dL (ref 8.4–10.4)
Chloride: 108 mEq/L (ref 98–109)
Creatinine: 0.9 mg/dL (ref 0.6–1.1)
GLUCOSE: 237 mg/dL — AB (ref 70–140)
Potassium: 4.8 mEq/L (ref 3.5–5.1)
SODIUM: 143 meq/L (ref 136–145)
TOTAL PROTEIN: 6.8 g/dL (ref 6.4–8.3)
Total Bilirubin: 0.33 mg/dL (ref 0.20–1.20)

## 2014-01-21 LAB — CBC WITH DIFFERENTIAL/PLATELET
BASO%: 0.2 % (ref 0.0–2.0)
Basophils Absolute: 0 10*3/uL (ref 0.0–0.1)
EOS ABS: 0 10*3/uL (ref 0.0–0.5)
EOS%: 0.3 % (ref 0.0–7.0)
HCT: 24.8 % — ABNORMAL LOW (ref 34.8–46.6)
HGB: 8.5 g/dL — ABNORMAL LOW (ref 11.6–15.9)
LYMPH%: 34.9 % (ref 14.0–49.7)
MCH: 32.9 pg (ref 25.1–34.0)
MCHC: 34.2 g/dL (ref 31.5–36.0)
MCV: 96.1 fL (ref 79.5–101.0)
MONO#: 0.5 10*3/uL (ref 0.1–0.9)
MONO%: 8.6 % (ref 0.0–14.0)
NEUT%: 56 % (ref 38.4–76.8)
NEUTROS ABS: 3.4 10*3/uL (ref 1.5–6.5)
Platelets: 139 10*3/uL — ABNORMAL LOW (ref 145–400)
RBC: 2.58 10*6/uL — AB (ref 3.70–5.45)
RDW: 22.5 % — ABNORMAL HIGH (ref 11.2–14.5)
WBC: 6 10*3/uL (ref 3.9–10.3)
lymph#: 2.1 10*3/uL (ref 0.9–3.3)

## 2014-01-21 LAB — HEMOGLOBIN A1C
HEMOGLOBIN A1C: 8.4 % — AB (ref <5.7)
Mean Plasma Glucose: 194 mg/dL — ABNORMAL HIGH (ref <117)

## 2014-01-21 NOTE — Telephone Encounter (Signed)
Pt notified that labs are OK for treatment tomorrow 01/22/14. ANC 3.4 and PLT 139K. Reminded to take decadron as directed

## 2014-01-21 NOTE — Telephone Encounter (Signed)
Message copied by Sharlynn Oliphant A on Tue Jan 21, 2014 10:02 AM ------      Message from: Evlyn Clines P      Created: Thu Jan 16, 2014  9:39 AM       For cbc and cmet 01-21-14.      Treat 2-4 if ANC >=1.5 and plt >=100k.Need to let patient know if she should take decadron/ if she will be treated.      Lennis needs to sign carboplatin order after chemistries result on 2-3; I am in EPIC class on 2-3 from 1-5 PM. I will not be available to sign orders on 2-4 after early AM.      Thanks for helping watch out!            Cc LA, TH ------

## 2014-01-22 ENCOUNTER — Other Ambulatory Visit: Payer: Self-pay | Admitting: Physician Assistant

## 2014-01-22 ENCOUNTER — Other Ambulatory Visit (HOSPITAL_BASED_OUTPATIENT_CLINIC_OR_DEPARTMENT_OTHER): Payer: BC Managed Care – PPO

## 2014-01-22 ENCOUNTER — Ambulatory Visit (HOSPITAL_BASED_OUTPATIENT_CLINIC_OR_DEPARTMENT_OTHER): Payer: BC Managed Care – PPO

## 2014-01-22 VITALS — BP 160/73 | HR 105 | Temp 98.1°F

## 2014-01-22 DIAGNOSIS — C5700 Malignant neoplasm of unspecified fallopian tube: Secondary | ICD-10-CM

## 2014-01-22 DIAGNOSIS — C5702 Malignant neoplasm of left fallopian tube: Secondary | ICD-10-CM

## 2014-01-22 DIAGNOSIS — E119 Type 2 diabetes mellitus without complications: Secondary | ICD-10-CM

## 2014-01-22 DIAGNOSIS — Z5111 Encounter for antineoplastic chemotherapy: Secondary | ICD-10-CM

## 2014-01-22 DIAGNOSIS — C541 Malignant neoplasm of endometrium: Secondary | ICD-10-CM

## 2014-01-22 LAB — CBC WITH DIFFERENTIAL/PLATELET
BASO%: 0.1 % (ref 0.0–2.0)
Basophils Absolute: 0 10*3/uL (ref 0.0–0.1)
EOS ABS: 0 10*3/uL (ref 0.0–0.5)
EOS%: 0 % (ref 0.0–7.0)
HCT: 24.9 % — ABNORMAL LOW (ref 34.8–46.6)
HGB: 8.3 g/dL — ABNORMAL LOW (ref 11.6–15.9)
LYMPH%: 7 % — AB (ref 14.0–49.7)
MCH: 32.4 pg (ref 25.1–34.0)
MCHC: 33.3 g/dL (ref 31.5–36.0)
MCV: 97.3 fL (ref 79.5–101.0)
MONO#: 0.1 10*3/uL (ref 0.1–0.9)
MONO%: 1.2 % (ref 0.0–14.0)
NEUT%: 91.7 % — AB (ref 38.4–76.8)
NEUTROS ABS: 11.2 10*3/uL — AB (ref 1.5–6.5)
PLATELETS: 162 10*3/uL (ref 145–400)
RBC: 2.56 10*6/uL — ABNORMAL LOW (ref 3.70–5.45)
RDW: 22.1 % — ABNORMAL HIGH (ref 11.2–14.5)
WBC: 12.2 10*3/uL — AB (ref 3.9–10.3)
lymph#: 0.9 10*3/uL (ref 0.9–3.3)

## 2014-01-22 LAB — COMPREHENSIVE METABOLIC PANEL (CC13)
ALBUMIN: 3.8 g/dL (ref 3.5–5.0)
ALT: 14 U/L (ref 0–55)
AST: 11 U/L (ref 5–34)
Alkaline Phosphatase: 79 U/L (ref 40–150)
Anion Gap: 13 mEq/L — ABNORMAL HIGH (ref 3–11)
BUN: 20.8 mg/dL (ref 7.0–26.0)
CALCIUM: 9.5 mg/dL (ref 8.4–10.4)
CHLORIDE: 108 meq/L (ref 98–109)
CO2: 20 mEq/L — ABNORMAL LOW (ref 22–29)
Creatinine: 1 mg/dL (ref 0.6–1.1)
GLUCOSE: 406 mg/dL — AB (ref 70–140)
POTASSIUM: 5 meq/L (ref 3.5–5.1)
Sodium: 140 mEq/L (ref 136–145)
Total Bilirubin: 0.23 mg/dL (ref 0.20–1.20)
Total Protein: 7 g/dL (ref 6.4–8.3)

## 2014-01-22 LAB — WHOLE BLOOD GLUCOSE
GLUCOSE: 292 mg/dL — AB (ref 70–100)
Glucose: 311 mg/dL — ABNORMAL HIGH (ref 70–100)
HRS PC: 1 Hours
HRS PC: 1 h

## 2014-01-22 MED ORDER — SODIUM CHLORIDE 0.9 % IV SOLN
80.0000 mg/m2 | Freq: Once | INTRAVENOUS | Status: AC
  Administered 2014-01-22: 144 mg via INTRAVENOUS
  Filled 2014-01-22: qty 24

## 2014-01-22 MED ORDER — DIPHENHYDRAMINE HCL 50 MG/ML IJ SOLN
25.0000 mg | Freq: Once | INTRAMUSCULAR | Status: AC
  Administered 2014-01-22: 25 mg via INTRAVENOUS

## 2014-01-22 MED ORDER — DIPHENHYDRAMINE HCL 50 MG/ML IJ SOLN
INTRAMUSCULAR | Status: AC
  Filled 2014-01-22: qty 1

## 2014-01-22 MED ORDER — DEXAMETHASONE SODIUM PHOSPHATE 20 MG/5ML IJ SOLN
INTRAMUSCULAR | Status: AC
  Filled 2014-01-22: qty 5

## 2014-01-22 MED ORDER — FAMOTIDINE IN NACL 20-0.9 MG/50ML-% IV SOLN
INTRAVENOUS | Status: AC
  Filled 2014-01-22: qty 50

## 2014-01-22 MED ORDER — INSULIN REGULAR HUMAN 100 UNIT/ML IJ SOLN
8.0000 [IU] | INTRAMUSCULAR | Status: DC
  Administered 2014-01-22: 8 [IU] via SUBCUTANEOUS
  Filled 2014-01-22: qty 0.08

## 2014-01-22 MED ORDER — SODIUM CHLORIDE 0.9 % IV SOLN
405.2000 mg | Freq: Once | INTRAVENOUS | Status: AC
  Administered 2014-01-22: 410 mg via INTRAVENOUS
  Filled 2014-01-22: qty 41

## 2014-01-22 MED ORDER — SODIUM CHLORIDE 0.9 % IV SOLN
Freq: Once | INTRAVENOUS | Status: AC
  Administered 2014-01-22: 09:00:00 via INTRAVENOUS

## 2014-01-22 MED ORDER — DEXAMETHASONE SODIUM PHOSPHATE 20 MG/5ML IJ SOLN
20.0000 mg | Freq: Once | INTRAMUSCULAR | Status: AC
  Administered 2014-01-22: 20 mg via INTRAVENOUS

## 2014-01-22 MED ORDER — INSULIN REGULAR HUMAN 100 UNIT/ML IJ SOLN
6.0000 [IU] | Freq: Once | INTRAMUSCULAR | Status: AC
  Administered 2014-01-22: 6 [IU] via SUBCUTANEOUS
  Filled 2014-01-22: qty 0.06

## 2014-01-22 MED ORDER — ONDANSETRON 16 MG/50ML IVPB (CHCC)
16.0000 mg | Freq: Once | INTRAVENOUS | Status: AC
  Administered 2014-01-22: 16 mg via INTRAVENOUS

## 2014-01-22 MED ORDER — ONDANSETRON 16 MG/50ML IVPB (CHCC)
INTRAVENOUS | Status: AC
  Filled 2014-01-22: qty 16

## 2014-01-22 MED ORDER — FAMOTIDINE IN NACL 20-0.9 MG/50ML-% IV SOLN
20.0000 mg | Freq: Once | INTRAVENOUS | Status: AC
  Administered 2014-01-22: 20 mg via INTRAVENOUS

## 2014-01-22 MED ORDER — HEPARIN SOD (PORK) LOCK FLUSH 100 UNIT/ML IV SOLN
500.0000 [IU] | Freq: Once | INTRAVENOUS | Status: DC | PRN
  Filled 2014-01-22: qty 5

## 2014-01-22 MED ORDER — SODIUM CHLORIDE 0.9 % IJ SOLN
10.0000 mL | INTRAMUSCULAR | Status: DC | PRN
  Filled 2014-01-22: qty 10

## 2014-01-22 NOTE — Patient Instructions (Signed)
[facility] Discharge Instructions for Patients Receiving Chemotherapy  Today you received the following chemotherapy agents taxol and carboplatin.  To help prevent nausea and vomiting after your treatment, we encourage you to take your nausea medication zofran.   If you develop nausea and vomiting that is not controlled by your nausea medication, call the clinic.   BELOW ARE SYMPTOMS THAT SHOULD BE REPORTED IMMEDIATELY:  *FEVER GREATER THAN 100.5 F  *CHILLS WITH OR WITHOUT FEVER  NAUSEA AND VOMITING THAT IS NOT CONTROLLED WITH YOUR NAUSEA MEDICATION  *UNUSUAL SHORTNESS OF BREATH  *UNUSUAL BRUISING OR BLEEDING  TENDERNESS IN MOUTH AND THROAT WITH OR WITHOUT PRESENCE OF ULCERS  *URINARY PROBLEMS  *BOWEL PROBLEMS  UNUSUAL RASH Items with * indicate a potential emergency and should be followed up as soon as possible.  Feel free to call the clinic you have any questions or concerns. The clinic phone number is [phone].    

## 2014-01-22 NOTE — Progress Notes (Signed)
Patient came to Chemo room with blood sugar of 311.  8 units of Humulin R  insulin given per sliding scale.   Because the patient's blood sugar was over 300, patients CBG was done at the end of treatment.  Blood sugar was 292.  Requested Awilda Metro PA to put in order for 6 units Humulin R insulin per sliding scale.  6 units given to patient before discharge.

## 2014-01-25 ENCOUNTER — Other Ambulatory Visit: Payer: Self-pay | Admitting: Oncology

## 2014-01-29 ENCOUNTER — Other Ambulatory Visit (HOSPITAL_BASED_OUTPATIENT_CLINIC_OR_DEPARTMENT_OTHER): Payer: BC Managed Care – PPO

## 2014-01-29 ENCOUNTER — Encounter: Payer: Self-pay | Admitting: *Deleted

## 2014-01-29 ENCOUNTER — Ambulatory Visit (HOSPITAL_BASED_OUTPATIENT_CLINIC_OR_DEPARTMENT_OTHER): Payer: BC Managed Care – PPO | Admitting: Oncology

## 2014-01-29 ENCOUNTER — Encounter: Payer: Self-pay | Admitting: Oncology

## 2014-01-29 ENCOUNTER — Ambulatory Visit (HOSPITAL_BASED_OUTPATIENT_CLINIC_OR_DEPARTMENT_OTHER): Payer: BC Managed Care – PPO

## 2014-01-29 ENCOUNTER — Ambulatory Visit (HOSPITAL_COMMUNITY)
Admission: RE | Admit: 2014-01-29 | Discharge: 2014-01-29 | Disposition: A | Discharge status: Home / Self Care | Payer: BC Managed Care – PPO | Source: Ambulatory Visit | Attending: Oncology | Admitting: Oncology

## 2014-01-29 VITALS — BP 133/69 | HR 107 | Temp 97.7°F | Resp 18 | Wt 161.3 lb

## 2014-01-29 DIAGNOSIS — C5702 Malignant neoplasm of left fallopian tube: Secondary | ICD-10-CM

## 2014-01-29 DIAGNOSIS — C541 Malignant neoplasm of endometrium: Secondary | ICD-10-CM

## 2014-01-29 DIAGNOSIS — C5700 Malignant neoplasm of unspecified fallopian tube: Secondary | ICD-10-CM

## 2014-01-29 DIAGNOSIS — T451X5A Adverse effect of antineoplastic and immunosuppressive drugs, initial encounter: Secondary | ICD-10-CM | POA: Insufficient documentation

## 2014-01-29 DIAGNOSIS — D62 Acute posthemorrhagic anemia: Secondary | ICD-10-CM

## 2014-01-29 DIAGNOSIS — E119 Type 2 diabetes mellitus without complications: Secondary | ICD-10-CM

## 2014-01-29 DIAGNOSIS — D6481 Anemia due to antineoplastic chemotherapy: Secondary | ICD-10-CM | POA: Insufficient documentation

## 2014-01-29 DIAGNOSIS — C549 Malignant neoplasm of corpus uteri, unspecified: Secondary | ICD-10-CM | POA: Insufficient documentation

## 2014-01-29 DIAGNOSIS — Z5111 Encounter for antineoplastic chemotherapy: Secondary | ICD-10-CM

## 2014-01-29 LAB — CBC WITH DIFFERENTIAL/PLATELET
BASO%: 0.1 % (ref 0.0–2.0)
Basophils Absolute: 0 10*3/uL (ref 0.0–0.1)
EOS%: 0 % (ref 0.0–7.0)
Eosinophils Absolute: 0 10*3/uL (ref 0.0–0.5)
HCT: 23.1 % — ABNORMAL LOW (ref 34.8–46.6)
HGB: 7.8 g/dL — ABNORMAL LOW (ref 11.6–15.9)
LYMPH#: 0.9 10*3/uL (ref 0.9–3.3)
LYMPH%: 13.4 % — ABNORMAL LOW (ref 14.0–49.7)
MCH: 31.8 pg (ref 25.1–34.0)
MCHC: 33.8 g/dL (ref 31.5–36.0)
MCV: 94.3 fL (ref 79.5–101.0)
MONO#: 0.1 10*3/uL (ref 0.1–0.9)
MONO%: 0.9 % (ref 0.0–14.0)
NEUT%: 85.6 % — ABNORMAL HIGH (ref 38.4–76.8)
NEUTROS ABS: 5.8 10*3/uL (ref 1.5–6.5)
Platelets: 181 10*3/uL (ref 145–400)
RBC: 2.45 10*6/uL — AB (ref 3.70–5.45)
RDW: 18.6 % — AB (ref 11.2–14.5)
WBC: 6.8 10*3/uL (ref 3.9–10.3)

## 2014-01-29 LAB — WHOLE BLOOD GLUCOSE
GLUCOSE: 262 mg/dL — AB (ref 70–100)
HRS PC: 0.75 Hours

## 2014-01-29 LAB — HOLD TUBE, BLOOD BANK

## 2014-01-29 LAB — GLUCOSE (CC13): GLUCOSE: 328 mg/dL — AB (ref 70–140)

## 2014-01-29 MED ORDER — DIPHENHYDRAMINE HCL 50 MG/ML IJ SOLN
25.0000 mg | Freq: Once | INTRAMUSCULAR | Status: AC
  Administered 2014-01-29: 25 mg via INTRAVENOUS

## 2014-01-29 MED ORDER — DEXAMETHASONE SODIUM PHOSPHATE 20 MG/5ML IJ SOLN
INTRAMUSCULAR | Status: AC
  Filled 2014-01-29: qty 5

## 2014-01-29 MED ORDER — INSULIN REGULAR HUMAN 100 UNIT/ML IJ SOLN
2.0000 [IU] | INTRAMUSCULAR | Status: DC
  Administered 2014-01-29: 8 [IU] via SUBCUTANEOUS
  Administered 2014-01-29: 6 [IU] via SUBCUTANEOUS
  Filled 2014-01-29: qty 0.08

## 2014-01-29 MED ORDER — FAMOTIDINE IN NACL 20-0.9 MG/50ML-% IV SOLN
INTRAVENOUS | Status: AC
  Filled 2014-01-29: qty 50

## 2014-01-29 MED ORDER — ONDANSETRON 8 MG/50ML IVPB (CHCC)
8.0000 mg | Freq: Once | INTRAVENOUS | Status: AC
  Administered 2014-01-29: 8 mg via INTRAVENOUS

## 2014-01-29 MED ORDER — PACLITAXEL CHEMO INJECTION 300 MG/50ML
80.0000 mg/m2 | Freq: Once | INTRAVENOUS | Status: AC
  Administered 2014-01-29: 144 mg via INTRAVENOUS
  Filled 2014-01-29: qty 24

## 2014-01-29 MED ORDER — FAMOTIDINE IN NACL 20-0.9 MG/50ML-% IV SOLN
20.0000 mg | Freq: Once | INTRAVENOUS | Status: AC
  Administered 2014-01-29: 20 mg via INTRAVENOUS

## 2014-01-29 MED ORDER — ONDANSETRON 8 MG/NS 50 ML IVPB
INTRAVENOUS | Status: AC
  Filled 2014-01-29: qty 8

## 2014-01-29 MED ORDER — SODIUM CHLORIDE 0.9 % IV SOLN
Freq: Once | INTRAVENOUS | Status: AC
Start: 2014-01-29 — End: 2014-01-29
  Administered 2014-01-29: 09:00:00 via INTRAVENOUS

## 2014-01-29 MED ORDER — DIPHENHYDRAMINE HCL 50 MG/ML IJ SOLN
INTRAMUSCULAR | Status: AC
  Filled 2014-01-29: qty 1

## 2014-01-29 MED ORDER — DEXAMETHASONE SODIUM PHOSPHATE 20 MG/5ML IJ SOLN
20.0000 mg | Freq: Once | INTRAMUSCULAR | Status: AC
  Administered 2014-01-29: 20 mg via INTRAVENOUS

## 2014-01-29 NOTE — Patient Instructions (Addendum)
[facility] Discharge Instructions for Patients Receiving Chemotherapy  Today you received the following chemotherapy agent: Taxol   You received 8 units of regular insulin (Humulin R) prior to your chemotherapy for blood glucose of 328. Repeat glucose 262, you received 6 more units before being discharged.  To help prevent nausea and vomiting after your treatment, we encourage you to take your nausea medication as prescribed.    If you develop nausea and vomiting that is not controlled by your nausea medication, call the clinic.   BELOW ARE SYMPTOMS THAT SHOULD BE REPORTED IMMEDIATELY:  *FEVER GREATER THAN 100.5 F  *CHILLS WITH OR WITHOUT FEVER  NAUSEA AND VOMITING THAT IS NOT CONTROLLED WITH YOUR NAUSEA MEDICATION  *UNUSUAL SHORTNESS OF BREATH  *UNUSUAL BRUISING OR BLEEDING  TENDERNESS IN MOUTH AND THROAT WITH OR WITHOUT PRESENCE OF ULCERS  *URINARY PROBLEMS  *BOWEL PROBLEMS  UNUSUAL RASH Items with * indicate a potential emergency and should be followed up as soon as possible.  Feel free to call the clinic you have any questions or concerns. The clinic phone number is [phone].

## 2014-01-29 NOTE — Progress Notes (Signed)
Chaplain made follow-up visit. Pt stated that she will be finishing her treatment in March and is very excited to go back to work. 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 Pt stated that she believes "God is good," and that she is ready to use what she has learned to "go help someone else." Pt said she is blessed that they found the cancer so early. Chaplain affirmed how she has handled her illness and grown throughout the treatment. Chaplain practiced active listening and caring presence.

## 2014-01-29 NOTE — Progress Notes (Signed)
Today's Hgb is 7.8; OK to proceed with scheduled chemotherapy per Dr. Marko Plume. Pt saw MD in clinic today before infusion. Patient to be transfused with RBC's this week.   Prior to treatment, patient given 8 units regular insulin per SS orders for glucose 328. Will recheck before discharge.   Patient ate [place] sandwich and diet sprite while in infusion room receiving chemotherapy.   1145: repeat glucose finger stick 262 per lab; 6 units insulin R given

## 2014-01-29 NOTE — Progress Notes (Signed)
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 She has had cytopenias requiring dose reductions and is symptomatic with further decline in hemoglobin today. She has underlying diabetes with blood sugars elevated around steroids for chemo and some preexisting peripheral neuropathy in feet.  She has intermittent neuropathy in fingertips which is not interfering with function. She does not notice any worsening of feet, tho that neuropathy is constant as it has been. She has had some constipation, last BM 2 days ago with Senokot. She notices fatigue with exertion such as walking in office, and some SOB with exertion. She has had no bleeding.  She does not have PAC.    ONCOLOGIC HISTORY Patient presented to Dr Dellis Filbert with ~ 6 months of vaginal spotting. Endometrial biopsy in Feb 2014 found grade 1 endometrial carcinoma. At that time the patient had not seen physician for diabetes in 4 years and was not checking blood sugars at home, tho she did continue to take insulin (patient states that she "did not need prescription" for the insulin); lack of medical care due to no insurance at that time. CT AP in [facility] system 02-26-2013 showed low attenuation in uterus but otherwise no findings suggesting metastatic disease and adnexa not remarkable. D and C was done and Mirena IUD placed by Dr Skeet Latch on 03-26-2013, with pathology [phone] endometrioid adenocarcinoma with squamous differentiation, grade 1. Patient established with Dr Coletta Memos and diabetes was controlled adequately to allow surgery by 09-17-13, this robotic TAH, BSO and bilateral pelvic node dissection by Dr Skeet Latch. 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 Post operative course was uncomplicated. Dr Skeet Latch recommended 6 cycles of taxol carboplatin particularly as omentectomy and periaortic nodes were not included in the surgical staging; dose dense regimen was chosen due to diabetic neuropathy and tolerance concerns. Cycle 1 day 1 was given 10-16-13. She was transfused PRBCs 12-18-13 for Hgb 7.6.   Review of systems as above, also: No fever or symptoms of infection. No chest pain. No LE swelling. Bladder ok. No bleeding. Remainder of 10 point Review of Systems negative.  Objective:  Vital signs in last 24 hours:  BP 133/69  Pulse 107  Temp(Src) 97.7 F (36.5 C) (Oral)  Resp 18  Wt 161 lb 4.8 oz (73.165 kg) Weight is stable. Alert, oriented and appropriate. Ambulatory without difficulty.  Alopecia  HEENT:PERRL, sclerae not icteric. Oral mucosa moist without lesions, posterior pharynx clear. Mucous membranes pale. Neck supple. No JVD.  Lymphatics:no cervical,suraclavicular, axillary or inguinal adenopathy Resp: clear to auscultation bilaterally and normal percussion bilaterally Cardio: regular rate and rhythm. No gallop. GI: soft, nontender, not distended, no mass or organomegaly. Normally active bowel sounds. Surgical incisions not remarkable. Musculoskeletal/ Extremities: without pitting edema, cords, tenderness Neuro:  peripheral neuropathy feet bilaterally unchanged. Otherwise nonfocal Skin without rash, ecchymosis, petechiae   Lab Results:  Results for orders placed in visit on 01/29/14  CBC WITH DIFFERENTIAL      Result Value Ref Range   WBC 6.8  3.9 -  10.3 10e3/uL   NEUT# 5.8  1.5 - 6.5 10e3/uL   HGB 7.8 (*) 11.6 -  15.9 g/dL   HCT 23.1 (*) 34.8 - 46.6 %   Platelets 181  145 - 400 10e3/uL   MCV 94.3  79.5 - 101.0 fL   MCH 31.8  25.1 - 34.0 pg   MCHC 33.8  31.5 - 36.0 g/dL   RBC 2.45 (*) 3.70 - 5.45 10e6/uL   RDW 18.6 (*) 11.2 - 14.5 %   lymph# 0.9  0.9 - 3.3 10e3/uL   MONO# 0.1  0.1 - 0.9 10e3/uL   Eosinophils Absolute 0.0  0.0 - 0.5 10e3/uL   Basophils Absolute 0.0  0.0 - 0.1 10e3/uL   NEUT% 85.6 (*) 38.4 - 76.8 %   LYMPH% 13.4 (*) 14.0 - 49.7 %   MONO% 0.9  0.0 - 14.0 %   EOS% 0.0  0.0 - 7.0 %   BASO% 0.1  0.0 - 2.0 %  GLUCOSE (CC13)      Result Value Ref Range   Glucose 328 (*) 70 - 140 mg/dl    CMET 01-22-14 day of chemo with steroids had glucose initially 406 (covered with insulin x2 at this office), BUN 20 and creat 1.0  Hemoglobin A1c on 01-21-14 was 8.4, reflecting in part the elevated glucoses related to steroids with weekly chemo. This information will be sent to Dr Coletta Memos   Studies/Result  Last CT AP was 02-2013  Per phone conversation with [facility] now [phone]),  diagnostic mammogram was done 01-01-14. I have spoken with Dr Chales Abrahams office now, requesting copy of that report.   Medications: I have reviewed the patient's current medications.  DISCUSSION: patient is in agreement with transfusion of 1 unit of PRBCs this week. We will try to complete 6 cycles of the chemo, but may need to decrease carboplatin further and may need to DC taxol depending on peripheral neuropathy symptoms.  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 Will try to complete planned 6 cycles, tho I expect blood counts will not tolerate increase in carboplatin and peripheral neuropathy may well limit taxol  last cycle. Dr Skeet Latch is to see her 03-13-14, after chemo completes. I will set up CT prior to that visit. 2.stage 1 grade 1 endometrial carcinoma: treated surgically, no adjuvant chemo or RT recommended  3.diabetes x 20 years: uncontrolled at diagnosis of the gyn cancer, now followed by Dr Coletta Memos, tho steroids with chemotherapy clearly impacting blood sugars. Hgb A1c as above. 4.diabetic retinal problems known to Dr [facility]  5.preexisting peripheral neuropathy left toes likely also diabetic: Initially some worse with taxol, then stable but very noticeable. It may be necessary to omit taxol from last cycle.  6.Report of follow up diagnostic mammogram done [facility] 01-01-14 requested by MD 7.dental concerns: seen by [place], patient declined intervention now. No active symptoms now 8. Flu vaccine done  9. Marked anxiety related to medical interventions initially 10. Restlessness to benadryl: premed dose decreased  11.peripheral IV access intermittently difficult but still has been obtained.  12. 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 She agrees to 1 unit PRBCs, to be given later this week. 13.previous noncompliance due primarily to lack of medical insurance then  LIVESAY,LENNIS P, MD   01/29/2014, 11:22 AM

## 2014-01-30 ENCOUNTER — Telehealth: Payer: Self-pay | Admitting: Oncology

## 2014-01-30 ENCOUNTER — Other Ambulatory Visit: Payer: Self-pay | Admitting: Oncology

## 2014-01-30 ENCOUNTER — Telehealth: Payer: Self-pay | Admitting: *Deleted

## 2014-01-30 ENCOUNTER — Encounter: Payer: Self-pay | Admitting: Oncology

## 2014-01-30 ENCOUNTER — Telehealth: Payer: Self-pay

## 2014-01-30 NOTE — Progress Notes (Signed)
Per Barbaraann Share patient was inquiring about her insurance. I called and she said she has already gotten it taken care of. I advised her we take all insurances, but she needs to make sure our drs--all her drs are in network.

## 2014-01-30 NOTE — Telephone Encounter (Signed)
Per staff message and POF I have scheduled appts.  JMW  

## 2014-01-30 NOTE — Telephone Encounter (Signed)
clled and left message for the patient to pick up schedule tomorrow

## 2014-01-30 NOTE — Telephone Encounter (Signed)
Reviewed with Makayla Buchanan the information for holding the Taxol on 02-12-14 and receiving carboplatin only as noted below by Dr. Marko Plume. Stressed the importance of reporting any increased peripheral neuropathy  to infusion nurse on 02-05-14 as Dr. Marko Plume would need to adjust the chemotherapy that day. Pt. will pick up new schedule and contrast tomorrow with blood transfusion.  Pt. verbalized understanding and is comfortable with plan discussed.

## 2014-01-30 NOTE — Telephone Encounter (Signed)
Message copied by Baruch Merl on Thu Jan 30, 2014 12:33 PM ------      Message from: Gordy Levan      Created: Thu Jan 30, 2014  9:48 AM       Please let patient know on 2-12 or when she is at office on 2-13 for transfusion that I have decided NOT to give her taxol with the last 3 week cycle of her chemo. She will have taxol on 2-18 as long as peripheral neuropathy not worse (she needs to LET us KNOW if it is worse then as I am not seeing her), then for the last cycle she will receive only carboplatin on 2-25 -- no taxol 2-25, no taxol next 2 weeks, only that one [place] as the last chemo treatment.            I have also added lab for CMET to transfusion on 2-13 and have messaged schedulers about this. If for some reason the CMET is missed on 2-13, it needs to be added to Rx 2-18 so results available for [place] 2-25.            Please also let her know that she needs CT AP prior to seeing Dr Skeet Latch on 3-25, that order sent to schedulers now.            I can talk with her while she is here for transfusion on 2-13 if she wants me to discuss taxol reasons directly, but I expect she will just be relieved to hear this plan.      Thank you      Cc LA, TH ------

## 2014-01-30 NOTE — Telephone Encounter (Signed)
, °

## 2014-01-31 ENCOUNTER — Other Ambulatory Visit: Payer: Self-pay

## 2014-01-31 ENCOUNTER — Other Ambulatory Visit (HOSPITAL_BASED_OUTPATIENT_CLINIC_OR_DEPARTMENT_OTHER): Payer: BC Managed Care – PPO

## 2014-01-31 ENCOUNTER — Encounter: Payer: Self-pay | Admitting: Oncology

## 2014-01-31 ENCOUNTER — Ambulatory Visit (HOSPITAL_BASED_OUTPATIENT_CLINIC_OR_DEPARTMENT_OTHER): Payer: BC Managed Care – PPO

## 2014-01-31 ENCOUNTER — Other Ambulatory Visit: Payer: Self-pay | Admitting: Oncology

## 2014-01-31 VITALS — BP 112/66 | HR 98 | Temp 98.0°F | Resp 18

## 2014-01-31 DIAGNOSIS — E119 Type 2 diabetes mellitus without complications: Secondary | ICD-10-CM

## 2014-01-31 DIAGNOSIS — D6481 Anemia due to antineoplastic chemotherapy: Secondary | ICD-10-CM

## 2014-01-31 DIAGNOSIS — C5702 Malignant neoplasm of left fallopian tube: Secondary | ICD-10-CM

## 2014-01-31 DIAGNOSIS — C5700 Malignant neoplasm of unspecified fallopian tube: Secondary | ICD-10-CM

## 2014-01-31 DIAGNOSIS — C541 Malignant neoplasm of endometrium: Secondary | ICD-10-CM

## 2014-01-31 DIAGNOSIS — T451X5A Adverse effect of antineoplastic and immunosuppressive drugs, initial encounter: Secondary | ICD-10-CM

## 2014-01-31 DIAGNOSIS — D62 Acute posthemorrhagic anemia: Secondary | ICD-10-CM

## 2014-01-31 LAB — COMPREHENSIVE METABOLIC PANEL (CC13)
ALBUMIN: 3.8 g/dL (ref 3.5–5.0)
ALT: 14 U/L (ref 0–55)
ANION GAP: 12 meq/L — AB (ref 3–11)
AST: 15 U/L (ref 5–34)
Alkaline Phosphatase: 62 U/L (ref 40–150)
BUN: 18.6 mg/dL (ref 7.0–26.0)
CALCIUM: 9.5 mg/dL (ref 8.4–10.4)
CHLORIDE: 108 meq/L (ref 98–109)
CO2: 24 meq/L (ref 22–29)
Creatinine: 1 mg/dL (ref 0.6–1.1)
Glucose: 221 mg/dl — ABNORMAL HIGH (ref 70–140)
POTASSIUM: 4.4 meq/L (ref 3.5–5.1)
Sodium: 144 mEq/L (ref 136–145)
Total Bilirubin: 0.62 mg/dL (ref 0.20–1.20)
Total Protein: 6.7 g/dL (ref 6.4–8.3)

## 2014-01-31 LAB — CBC WITH DIFFERENTIAL/PLATELET
BASO%: 0.3 % (ref 0.0–2.0)
Basophils Absolute: 0 10*3/uL (ref 0.0–0.1)
EOS%: 0.1 % (ref 0.0–7.0)
Eosinophils Absolute: 0 10*3/uL (ref 0.0–0.5)
HCT: 22.9 % — ABNORMAL LOW (ref 34.8–46.6)
HGB: 7.8 g/dL — ABNORMAL LOW (ref 11.6–15.9)
LYMPH#: 2.5 10*3/uL (ref 0.9–3.3)
LYMPH%: 47.4 % (ref 14.0–49.7)
MCH: 33.2 pg (ref 25.1–34.0)
MCHC: 33.9 g/dL (ref 31.5–36.0)
MCV: 98.1 fL (ref 79.5–101.0)
MONO#: 0.2 10*3/uL (ref 0.1–0.9)
MONO%: 3.3 % (ref 0.0–14.0)
NEUT#: 2.6 10*3/uL (ref 1.5–6.5)
NEUT%: 48.9 % (ref 38.4–76.8)
Platelets: 183 10*3/uL (ref 145–400)
RBC: 2.34 10*6/uL — AB (ref 3.70–5.45)
RDW: 20.4 % — AB (ref 11.2–14.5)
WBC: 5.3 10*3/uL (ref 3.9–10.3)

## 2014-01-31 LAB — WHOLE BLOOD GLUCOSE
GLUCOSE: 224 mg/dL — AB (ref 70–100)
HRS PC: 0.5 h

## 2014-01-31 LAB — PREPARE RBC (CROSSMATCH)

## 2014-01-31 LAB — HOLD TUBE, BLOOD BANK

## 2014-01-31 MED ORDER — ACETAMINOPHEN 325 MG PO TABS
650.0000 mg | ORAL_TABLET | Freq: Once | ORAL | Status: AC
  Administered 2014-01-31: 650 mg via ORAL

## 2014-01-31 MED ORDER — ACETAMINOPHEN 325 MG PO TABS
ORAL_TABLET | ORAL | Status: AC
  Filled 2014-01-31: qty 2

## 2014-01-31 MED ORDER — SODIUM CHLORIDE 0.9 % IJ SOLN
10.0000 mL | INTRAMUSCULAR | Status: DC | PRN
  Filled 2014-01-31: qty 10

## 2014-01-31 MED ORDER — SODIUM CHLORIDE 0.9 % IV SOLN
Freq: Once | INTRAVENOUS | Status: AC
  Administered 2014-01-31: 10:00:00 via INTRAVENOUS

## 2014-01-31 NOTE — Patient Instructions (Signed)
Blood Transfusion Information WHAT IS A BLOOD TRANSFUSION? A transfusion is the replacement of blood or some of its parts. Blood is made up of multiple cells which provide different functions.  Red blood cells carry oxygen and are used for blood loss replacement.  White blood cells fight against infection.  Platelets control bleeding.  Plasma helps clot blood.  Other blood products are available for specialized needs, such as hemophilia or other clotting disorders. BEFORE THE TRANSFUSION  Who gives blood for transfusions?   You may be able to donate blood to be used at a later date on yourself (autologous donation).  Relatives can be asked to donate blood. This is generally not any safer than if you have received blood from a stranger. The same precautions are taken to ensure safety when a relative's blood is donated.  Healthy volunteers who are fully evaluated to make sure their blood is safe. This is blood bank blood. Transfusion therapy is the safest it has ever been in the practice of medicine. Before blood is taken from a donor, a complete history is taken to make sure that person has no history of diseases nor engages in risky social behavior (examples are intravenous drug use or sexual activity with multiple partners). The donor's travel history is screened to minimize risk of transmitting infections, such as malaria. The donated blood is tested for signs of infectious diseases, such as HIV and hepatitis. The blood is then tested to be sure it is compatible with you in order to minimize the chance of a transfusion reaction. If you or a relative donates blood, this is often done in anticipation of surgery and is not appropriate for emergency situations. It takes many days to process the donated blood. RISKS AND COMPLICATIONS Although transfusion therapy is very safe and saves many lives, the main dangers of transfusion include:   Getting an infectious disease.  Developing a  transfusion reaction. This is an allergic reaction to something in the blood you were given. Every precaution is taken to prevent this. The decision to have a blood transfusion has been considered carefully by your caregiver before blood is given. Blood is not given unless the benefits outweigh the risks. AFTER THE TRANSFUSION  Right after receiving a blood transfusion, you will usually feel much better and more energetic. This is especially true if your red blood cells have gotten low (anemic). The transfusion raises the level of the red blood cells which carry oxygen, and this usually causes an energy increase.  The nurse administering the transfusion will monitor you carefully for complications. HOME CARE INSTRUCTIONS  No special instructions are needed after a transfusion. You may find your energy is better. Speak with your caregiver about any limitations on activity for underlying diseases you may have. SEEK MEDICAL CARE IF:   Your condition is not improving after your transfusion.  You develop redness or irritation at the intravenous (IV) site. SEEK IMMEDIATE MEDICAL CARE IF:  Any of the following symptoms occur over the next 12 hours:  Shaking chills.  You have a temperature by mouth above 102 F (38.9 C), not controlled by medicine.  Chest, back, or muscle pain.  People around you feel you are not acting correctly or are confused.  Shortness of breath or difficulty breathing.  Dizziness and fainting.  You get a rash or develop hives.  You have a decrease in urine output.  Your urine turns a dark color or changes to pink, red, or brown. Any of the following   symptoms occur over the next 10 days:  You have a temperature by mouth above 102 F (38.9 C), not controlled by medicine.  Shortness of breath.  Weakness after normal activity.  The white part of the eye turns yellow (jaundice).  You have a decrease in the amount of urine or are urinating less often.  Your  urine turns a dark color or changes to pink, red, or brown. Document Released: 12/02/2000 Document Revised: 02/27/2012 Document Reviewed: 07/21/2008 Exit[employer] Patient Information 2014 [employer].  

## 2014-01-31 NOTE — Progress Notes (Signed)
Medical Oncology  Report of digital diagnostic bilateral mammograms done 01-01-14 at [facility] received: benign bilateral breast calcifications. No evidence of malignancy.  Report to be scanned into this EMR.  Godfrey Pick, MD

## 2014-02-01 LAB — TYPE AND SCREEN
ABO/RH(D): B POS
Antibody Screen: NEGATIVE
Unit division: 0

## 2014-02-05 ENCOUNTER — Other Ambulatory Visit: Payer: Self-pay | Admitting: Oncology

## 2014-02-05 ENCOUNTER — Ambulatory Visit (HOSPITAL_BASED_OUTPATIENT_CLINIC_OR_DEPARTMENT_OTHER): Payer: BC Managed Care – PPO

## 2014-02-05 VITALS — BP 151/75 | HR 106 | Temp 97.5°F | Resp 18

## 2014-02-05 DIAGNOSIS — Z5111 Encounter for antineoplastic chemotherapy: Secondary | ICD-10-CM

## 2014-02-05 DIAGNOSIS — C541 Malignant neoplasm of endometrium: Secondary | ICD-10-CM

## 2014-02-05 DIAGNOSIS — E119 Type 2 diabetes mellitus without complications: Secondary | ICD-10-CM

## 2014-02-05 DIAGNOSIS — C5700 Malignant neoplasm of unspecified fallopian tube: Secondary | ICD-10-CM

## 2014-02-05 DIAGNOSIS — C801 Malignant (primary) neoplasm, unspecified: Secondary | ICD-10-CM

## 2014-02-05 LAB — CBC WITH DIFFERENTIAL/PLATELET
BASO%: 0.2 % (ref 0.0–2.0)
Basophils Absolute: 0 10*3/uL (ref 0.0–0.1)
EOS%: 0 % (ref 0.0–7.0)
Eosinophils Absolute: 0 10*3/uL (ref 0.0–0.5)
HCT: 28.9 % — ABNORMAL LOW (ref 34.8–46.6)
HEMOGLOBIN: 9.7 g/dL — AB (ref 11.6–15.9)
LYMPH%: 22.3 % (ref 14.0–49.7)
MCH: 31.3 pg (ref 25.1–34.0)
MCHC: 33.6 g/dL (ref 31.5–36.0)
MCV: 93.2 fL (ref 79.5–101.0)
MONO#: 0.1 10*3/uL (ref 0.1–0.9)
MONO%: 1.1 % (ref 0.0–14.0)
NEUT#: 3.5 10*3/uL (ref 1.5–6.5)
NEUT%: 76.4 % (ref 38.4–76.8)
NRBC: 0 % (ref 0–0)
Platelets: 135 10*3/uL — ABNORMAL LOW (ref 145–400)
RBC: 3.1 10*6/uL — ABNORMAL LOW (ref 3.70–5.45)
RDW: 17.3 % — ABNORMAL HIGH (ref 11.2–14.5)
WBC: 4.6 10*3/uL (ref 3.9–10.3)
lymph#: 1 10*3/uL (ref 0.9–3.3)

## 2014-02-05 LAB — WHOLE BLOOD GLUCOSE
Glucose: 513 mg/dL — ABNORMAL HIGH (ref 70–100)
HRS PC: 3 h

## 2014-02-05 MED ORDER — DIPHENHYDRAMINE HCL 50 MG/ML IJ SOLN
INTRAMUSCULAR | Status: AC
  Filled 2014-02-05: qty 1

## 2014-02-05 MED ORDER — FAMOTIDINE IN NACL 20-0.9 MG/50ML-% IV SOLN
INTRAVENOUS | Status: AC
  Filled 2014-02-05: qty 50

## 2014-02-05 MED ORDER — SODIUM CHLORIDE 0.9 % IV SOLN
80.0000 mg/m2 | Freq: Once | INTRAVENOUS | Status: AC
  Administered 2014-02-05: 144 mg via INTRAVENOUS
  Filled 2014-02-05: qty 24

## 2014-02-05 MED ORDER — DEXAMETHASONE SODIUM PHOSPHATE 20 MG/5ML IJ SOLN
20.0000 mg | Freq: Once | INTRAMUSCULAR | Status: AC
  Administered 2014-02-05: 20 mg via INTRAVENOUS

## 2014-02-05 MED ORDER — DEXAMETHASONE SODIUM PHOSPHATE 20 MG/5ML IJ SOLN
INTRAMUSCULAR | Status: AC
  Filled 2014-02-05: qty 5

## 2014-02-05 MED ORDER — INSULIN REGULAR HUMAN 100 UNIT/ML IJ SOLN
8.0000 [IU] | INTRAMUSCULAR | Status: DC
  Administered 2014-02-05: 8 [IU] via SUBCUTANEOUS
  Filled 2014-02-05: qty 0.08

## 2014-02-05 MED ORDER — FAMOTIDINE IN NACL 20-0.9 MG/50ML-% IV SOLN
20.0000 mg | Freq: Once | INTRAVENOUS | Status: AC
  Administered 2014-02-05: 20 mg via INTRAVENOUS

## 2014-02-05 MED ORDER — ONDANSETRON 8 MG/NS 50 ML IVPB
INTRAVENOUS | Status: AC
  Filled 2014-02-05: qty 8

## 2014-02-05 MED ORDER — ONDANSETRON 8 MG/50ML IVPB (CHCC)
8.0000 mg | Freq: Once | INTRAVENOUS | Status: AC
  Administered 2014-02-05: 8 mg via INTRAVENOUS

## 2014-02-05 MED ORDER — SODIUM CHLORIDE 0.9 % IV SOLN
Freq: Once | INTRAVENOUS | Status: AC
  Administered 2014-02-05: 11:00:00 via INTRAVENOUS

## 2014-02-05 MED ORDER — DIPHENHYDRAMINE HCL 50 MG/ML IJ SOLN
25.0000 mg | Freq: Once | INTRAMUSCULAR | Status: AC
  Administered 2014-02-05: 25 mg via INTRAVENOUS

## 2014-02-05 NOTE — Patient Instructions (Signed)
[facility] Discharge Instructions for Patients Receiving Chemotherapy  Today you received the following chemotherapy agents taxol  To help prevent nausea and vomiting after your treatment, we encourage you to take your nausea medication as directed   If you develop nausea and vomiting that is not controlled by your nausea medication, call the clinic.   BELOW ARE SYMPTOMS THAT SHOULD BE REPORTED IMMEDIATELY:  *FEVER GREATER THAN 100.5 F  *CHILLS WITH OR WITHOUT FEVER  NAUSEA AND VOMITING THAT IS NOT CONTROLLED WITH YOUR NAUSEA MEDICATION  *UNUSUAL SHORTNESS OF BREATH  *UNUSUAL BRUISING OR BLEEDING  TENDERNESS IN MOUTH AND THROAT WITH OR WITHOUT PRESENCE OF ULCERS  *URINARY PROBLEMS  *BOWEL PROBLEMS  UNUSUAL RASH Items with * indicate a potential emergency and should be followed up as soon as possible.  Feel free to call the clinic you have any questions or concerns. The clinic phone number is [phone].  

## 2014-02-05 NOTE — Progress Notes (Signed)
Ok to treat with hgb 7.8 per Dr. Marko Plume, with one unit of blood.  If unable to give both taxol and prbc's due to time, treat pt with one unit of blood.

## 2014-02-05 NOTE — Progress Notes (Addendum)
Pt reports she has been out of insulin and is only taking metformin.  Pre-treatment CBG 513.  Pt given 8 units insulin. Post treatment CBG, coverage and fluids not administered - RN error.   Pt contacted by phone - notified to check her blood sugar at home and contact clinic for any problems.  Pt reports she has contacted her primary to obtain insulin.

## 2014-02-06 ENCOUNTER — Ambulatory Visit: Payer: BC Managed Care – PPO | Admitting: Gynecologic Oncology

## 2014-02-11 ENCOUNTER — Telehealth: Payer: Self-pay | Admitting: *Deleted

## 2014-02-11 ENCOUNTER — Telehealth: Payer: Self-pay

## 2014-02-11 NOTE — Telephone Encounter (Signed)
Patient called asking if she is for chemotherapy tomorrow.  Doesn't have any further appointments for chemotherapy after tomorrow.  Also asked if she is to take the steroid 12 hours before infusion.    Noted plan is to complete  Six cycles.  Day 8 and day 15 of Cycle 6 Taxol/carboplatin not noted by patient.  Will notify providers of this request.  Next scheduled f/u is 03-03-2014.

## 2014-02-11 NOTE — Telephone Encounter (Signed)
Told Makayla Buchanan that Dr. Marko Plume  Is only going to give her the carboplatin only  for Cycle 6 due to peripheral neuropathy due to  the TAXOL. This means that she will be only receiving carboplatin tomorrow D1.  She does not need to take her steroidsas those are for the taxol chemotherapy..  Since the D8 and D15 of the cycle is Taxol chemotherapy only, tomorrow will be the last treatment until scans and visits.  Pt. Verbalized understanding.

## 2014-02-12 ENCOUNTER — Ambulatory Visit: Payer: BC Managed Care – PPO

## 2014-02-12 ENCOUNTER — Ambulatory Visit (HOSPITAL_BASED_OUTPATIENT_CLINIC_OR_DEPARTMENT_OTHER): Payer: BC Managed Care – PPO

## 2014-02-12 DIAGNOSIS — C5700 Malignant neoplasm of unspecified fallopian tube: Secondary | ICD-10-CM

## 2014-02-12 LAB — CBC WITH DIFFERENTIAL/PLATELET
BASO%: 0.3 % (ref 0.0–2.0)
Basophils Absolute: 0 10*3/uL (ref 0.0–0.1)
EOS ABS: 0 10*3/uL (ref 0.0–0.5)
EOS%: 0 % (ref 0.0–7.0)
HCT: 25.7 % — ABNORMAL LOW (ref 34.8–46.6)
HGB: 8.6 g/dL — ABNORMAL LOW (ref 11.6–15.9)
LYMPH%: 54.9 % — AB (ref 14.0–49.7)
MCH: 31.4 pg (ref 25.1–34.0)
MCHC: 33.5 g/dL (ref 31.5–36.0)
MCV: 93.8 fL (ref 79.5–101.0)
MONO#: 0.4 10*3/uL (ref 0.1–0.9)
MONO%: 10.1 % (ref 0.0–14.0)
NEUT%: 34.7 % — AB (ref 38.4–76.8)
NEUTROS ABS: 1.2 10*3/uL — AB (ref 1.5–6.5)
PLATELETS: 106 10*3/uL — AB (ref 145–400)
RBC: 2.74 10*6/uL — ABNORMAL LOW (ref 3.70–5.45)
RDW: 17.9 % — ABNORMAL HIGH (ref 11.2–14.5)
WBC: 3.5 10*3/uL — ABNORMAL LOW (ref 3.9–10.3)
lymph#: 1.9 10*3/uL (ref 0.9–3.3)
nRBC: 0 % (ref 0–0)

## 2014-02-12 NOTE — Progress Notes (Signed)
Pt's anc 1.2 & hgb 8.6.  Discussed with Dr. Mariana Kaufman RN & she called Dr Marko Plume & treatment will be held today & doesn't have to be made up.  Pt informed & she was very pleased.

## 2014-03-01 ENCOUNTER — Other Ambulatory Visit: Payer: Self-pay | Admitting: Oncology

## 2014-03-03 ENCOUNTER — Encounter: Payer: Self-pay | Admitting: Oncology

## 2014-03-03 ENCOUNTER — Telehealth: Payer: Self-pay | Admitting: Oncology

## 2014-03-03 ENCOUNTER — Ambulatory Visit (HOSPITAL_BASED_OUTPATIENT_CLINIC_OR_DEPARTMENT_OTHER): Payer: BC Managed Care – PPO | Admitting: Oncology

## 2014-03-03 ENCOUNTER — Other Ambulatory Visit (HOSPITAL_BASED_OUTPATIENT_CLINIC_OR_DEPARTMENT_OTHER): Payer: BC Managed Care – PPO

## 2014-03-03 VITALS — BP 131/66 | HR 97 | Temp 98.0°F | Resp 18 | Ht 63.0 in | Wt 162.0 lb

## 2014-03-03 DIAGNOSIS — IMO0002 Reserved for concepts with insufficient information to code with codable children: Secondary | ICD-10-CM

## 2014-03-03 DIAGNOSIS — E118 Type 2 diabetes mellitus with unspecified complications: Secondary | ICD-10-CM

## 2014-03-03 DIAGNOSIS — C5700 Malignant neoplasm of unspecified fallopian tube: Secondary | ICD-10-CM

## 2014-03-03 DIAGNOSIS — H359 Unspecified retinal disorder: Secondary | ICD-10-CM

## 2014-03-03 DIAGNOSIS — C549 Malignant neoplasm of corpus uteri, unspecified: Secondary | ICD-10-CM

## 2014-03-03 DIAGNOSIS — E1165 Type 2 diabetes mellitus with hyperglycemia: Secondary | ICD-10-CM

## 2014-03-03 DIAGNOSIS — K089 Disorder of teeth and supporting structures, unspecified: Secondary | ICD-10-CM

## 2014-03-03 LAB — COMPREHENSIVE METABOLIC PANEL (CC13)
ALT: 11 U/L (ref 0–55)
ANION GAP: 12 meq/L — AB (ref 3–11)
AST: 16 U/L (ref 5–34)
Albumin: 3.8 g/dL (ref 3.5–5.0)
Alkaline Phosphatase: 68 U/L (ref 40–150)
BILIRUBIN TOTAL: 0.2 mg/dL (ref 0.20–1.20)
BUN: 12.1 mg/dL (ref 7.0–26.0)
CO2: 21 mEq/L — ABNORMAL LOW (ref 22–29)
CREATININE: 0.9 mg/dL (ref 0.6–1.1)
Calcium: 9.2 mg/dL (ref 8.4–10.4)
Chloride: 110 mEq/L — ABNORMAL HIGH (ref 98–109)
Glucose: 214 mg/dl — ABNORMAL HIGH (ref 70–140)
Potassium: 4.4 mEq/L (ref 3.5–5.1)
Sodium: 143 mEq/L (ref 136–145)
Total Protein: 7 g/dL (ref 6.4–8.3)

## 2014-03-03 LAB — CBC WITH DIFFERENTIAL/PLATELET
BASO%: 0.3 % (ref 0.0–2.0)
Basophils Absolute: 0 10*3/uL (ref 0.0–0.1)
EOS%: 0.2 % (ref 0.0–7.0)
Eosinophils Absolute: 0 10*3/uL (ref 0.0–0.5)
HEMATOCRIT: 26.5 % — AB (ref 34.8–46.6)
HGB: 8.9 g/dL — ABNORMAL LOW (ref 11.6–15.9)
LYMPH%: 26.6 % (ref 14.0–49.7)
MCH: 33 pg (ref 25.1–34.0)
MCHC: 33.4 g/dL (ref 31.5–36.0)
MCV: 98.8 fL (ref 79.5–101.0)
MONO#: 0.5 10*3/uL (ref 0.1–0.9)
MONO%: 8.1 % (ref 0.0–14.0)
NEUT#: 3.8 10*3/uL (ref 1.5–6.5)
NEUT%: 64.8 % (ref 38.4–76.8)
PLATELETS: 209 10*3/uL (ref 145–400)
RBC: 2.68 10*6/uL — ABNORMAL LOW (ref 3.70–5.45)
RDW: 17.8 % — ABNORMAL HIGH (ref 11.2–14.5)
WBC: 5.9 10*3/uL (ref 3.9–10.3)
lymph#: 1.6 10*3/uL (ref 0.9–3.3)

## 2014-03-03 NOTE — Telephone Encounter (Signed)
, °

## 2014-03-03 NOTE — Progress Notes (Signed)
OFFICE PROGRESS NOTE   03/03/2014   Physicians:Wendy Brewster, ML Lavoie, D.Lucianne Lei, K.Heckler   INTERVAL HISTORY:   Patient is seen, alone for visit, in follow up of recently completed adjuvant chemotherapy for clinically unstaged low grade fallopian carcinoma. She is for follow up CT AP 03-11-14 and will see Dr Skeet Latch on 03-13-14. Patient was treated with carboplatin and taxol x 5 cycles from 10-16-13 thru 02-05-14, using dose dense regimen due to preexisting peripheral neuropathy. Chemotherapy was complicated by anemia and thrombocytopenia, transfused PRBCs on 12-18-13 and 01-31-14. Chemotherapy was also complicated by hyperglycemia related to steroids, and worsening of  peripheral neuropathy by completion of cycle 5. Cycle 6 was omitted due to peripheral neuropathy and low counts.  She does not have PAC.  Patient had bleed into right eye with acute loss of vision ~ 2 weeks ago, with surgery by Dr Sherlynn Stalls last week; vision was restored with the procedure and she is for return visit to Dr Baird Cancer tomorrow. Otherwise she has felt gradually better overall, without worsening symptoms of anemia. She would like to return to work this week and I have written that note.  ONCOLOGIC HISTORY Oncology History   Stage 1 grade 1 at [facility] BSO and pelvic lymph node dissection 09-17-13, with <50% myometrial invasion and no lymphovascular space invasion     Endometrial cancer   03/07/2013 Initial Diagnosis Endometrial cancer  Patient presented to Dr Dellis Filbert with ~ 6 months of vaginal spotting. Endometrial biopsy in Feb 2014 found grade 1 endometrial carcinoma. At that time the patient had not seen physician for diabetes in 4 years and was not checking blood sugars at home, tho she did continue to take insulin (patient states that she "did not need prescription" for the insulin); lack of medical care due to no insurance at that time. CT AP in  system 02-26-2013 showed low attenuation in  uterus but otherwise no findings suggesting metastatic disease and adnexa not remarkable. D and C was done and Mirena IUD placed by Dr Skeet Latch on 03-26-2013, with pathology [phone] endometrioid adenocarcinoma with squamous differentiation, grade 1. Patient established with Dr Coletta Memos and diabetes was controlled adequately to allow surgery by 09-17-13, this robotic [facility], BSO and bilateral pelvic node dissection by Dr Skeet Latch. 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 Post operative course was uncomplicated. Dr Skeet Latch recommended 6 cycles of taxol carboplatin particularly as omentectomy and periaortic nodes were not included in the surgical staging; dose dense regimen was chosen due to diabetic neuropathy and tolerance concerns. Cycle 1 day 1 was given 10-16-13. She was transfused PRBCs 12-18-13 for Hgb 7.6 and on 01-31-14 for hgb 7.8. Day 15 cycle 5 was 02-05-14, with chemotherapy ended then.   Review of systems as above, also: No fever or symptoms of infection. Bowels moving well daily without laxatives. Blood sugars 79 - 87 in AMs and ~ 130 during day. No bleeding. No abdominal or pelvic pain. Bladder ok. Appetite good. No cough. No numbness hands. Stable numbness in feet, which bothers her mostly at hs, improves with ice.  Remainder of 10 point Review of Systems negative.  Objective:  Vital signs in last 24 hours:  BP 131/66  Pulse 97  Temp(Src) 98 F (36.7 C) (  Oral)  Resp 18  Ht 5\' 3"  (1.6 m)  Wt 162 lb (73.483 kg)  BMI 28.70 kg/m2 Weight up 1 lb. Alert, oriented and appropriate. Ambulatory without difficulty. Respirations not labored  RA. Alopecia  HEENT:PERRL, sclerae not icteric. Oral mucosa moist without lesions, posterior pharynx clear.  Neck supple. No JVD.  Lymphatics:no cervical,suraclavicular, axillary or inguinal adenopathy Resp: clear to auscultation bilaterally and normal percussion bilaterally Cardio: regular rate and rhythm. No gallop. GI: soft, nontender, not distended, no mass or organomegaly. Normally active bowel sounds. Surgical incision not remarkable. Musculoskeletal/ Extremities: without pitting edema, cords, tenderness Neuro: sensory peripheral neuropathy feet bilaterally unchanged. Otherwise nonfocal. Psych appropriate mood and affect. Skin without rash, ecchymosis, petechiae Breasts: without dominant mass, skin or nipple findings. Axillae benign.   Lab Results:  Results for orders placed in visit on 03/03/14  CBC WITH DIFFERENTIAL      Result Value Ref Range   WBC 5.9  3.9 - 10.3 10e3/uL   NEUT# 3.8  1.5 - 6.5 10e3/uL   HGB 8.9 (*) 11.6 - 15.9 g/dL   HCT 26.5 (*) 34.8 - 46.6 %   Platelets 209  145 - 400 10e3/uL   MCV 98.8  79.5 - 101.0 fL   MCH 33.0  25.1 - 34.0 pg   MCHC 33.4  31.5 - 36.0 g/dL   RBC 2.68 (*) 3.70 - 5.45 10e6/uL   RDW 17.8 (*) 11.2 - 14.5 %   lymph# 1.6  0.9 - 3.3 10e3/uL   MONO# 0.5  0.1 - 0.9 10e3/uL   Eosinophils Absolute 0.0  0.0 - 0.5 10e3/uL   Basophils Absolute 0.0  0.0 - 0.1 10e3/uL   NEUT% 64.8  38.4 - 76.8 %   LYMPH% 26.6  14.0 - 49.7 %   MONO% 8.1  0.0 - 14.0 %   EOS% 0.2  0.0 - 7.0 %   BASO% 0.3  0.0 - 2.0 %    CBC reviewed with patient now: WBC and plt up, hemoglobin a bit better.   CA 125 available after visit 4.4   Studies/Results:  CT AP ordered for 03-11-14  Medications: I have reviewed the patient's current medications.  DISCUSSION: hopefully the CT and Dr Leone Brand exam will all be good and she will go onto observation. I will see her back in ~ 6 weeks in follow up of blood counts.  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 Chemo completed with cycle 5 on 02-05-14 (cycle 6 omitted due to progressive peripheral neuropathy and cytopenias). She will have CT 03-11-14 prior to seeing Dr Skeet Latch on 03-13-14.  2.stage 1 grade 1 endometrial carcinoma: treated surgically, no adjuvant chemo or RT recommended  3.diabetes x 20 years: uncontrolled at diagnosis of the gyn cancer, now followed by Dr Coletta Memos, tho steroids with chemotherapy clearly impacting blood sugars. Hgb A1c as above.  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 No active symptoms now  8. Diagnostic bilateral mammograms at [facility] 01-01-2014 with benign appearing calcifications,  report scanned into this EMR    I will see her back with follow up CBC in ~ 6 weeks    Mclain Freer P, MD   03/03/2014, 1:46 PM

## 2014-03-04 LAB — CA 125: CA 125: 4.4 U/mL (ref 0.0–30.2)

## 2014-03-11 ENCOUNTER — Ambulatory Visit (HOSPITAL_COMMUNITY)
Admission: RE | Admit: 2014-03-11 | Discharge: 2014-03-11 | Disposition: A | Discharge status: Home / Self Care | Payer: BC Managed Care – PPO | Source: Ambulatory Visit | Attending: Oncology | Admitting: Oncology

## 2014-03-11 ENCOUNTER — Encounter (HOSPITAL_COMMUNITY): Payer: Self-pay

## 2014-03-11 DIAGNOSIS — C541 Malignant neoplasm of endometrium: Secondary | ICD-10-CM

## 2014-03-11 DIAGNOSIS — C549 Malignant neoplasm of corpus uteri, unspecified: Secondary | ICD-10-CM | POA: Insufficient documentation

## 2014-03-11 DIAGNOSIS — Z9221 Personal history of antineoplastic chemotherapy: Secondary | ICD-10-CM | POA: Insufficient documentation

## 2014-03-11 DIAGNOSIS — C5700 Malignant neoplasm of unspecified fallopian tube: Secondary | ICD-10-CM

## 2014-03-11 DIAGNOSIS — K429 Umbilical hernia without obstruction or gangrene: Secondary | ICD-10-CM | POA: Insufficient documentation

## 2014-03-11 MED ORDER — IOHEXOL 300 MG/ML  SOLN
100.0000 mL | Freq: Once | INTRAMUSCULAR | Status: AC | PRN
  Administered 2014-03-11: 100 mL via INTRAVENOUS

## 2014-03-12 ENCOUNTER — Telehealth: Payer: Self-pay | Admitting: Gynecologic Oncology

## 2014-03-12 NOTE — Telephone Encounter (Signed)
Office Visit:  GYN ONCOLOGY  CC:  Chief Complaint   Patient presents with   .  endometrial cancer   Fallopian tube cancer  Assessment/Plan:  58 y.o. 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  This represents a very early fallopian tube cancer that was only detected on microscopic evaluation. As such omentectomy and periaortic nodes were not assessed at the time of surgery last week.  Has received 1/6 cycles taxol/carboplatin  A referral was made for genetic counselling and testing given the patients synchronous uterine/fallopian tube malignancy and her mother's breast cancer diagnosis.  HPI:  Makayla Buchanan was initially  seen at the request of Dr. Dellis Filbert a few months ago  for a newly diagnosed endometrial cancer.  She's been menopausal for essentially 5 years. She never took any hormone replacement therapy. Due to the persistent nature for spotting she went to urgent care and his was referred to Dr. Dellis Filbert.  Dr. Dellis Filbert performed an endometrial biopsy on February 13 2013. 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  She also had a CT scan of the abdomen and pelvis.   Findings: The lung bases are clear. The liver enhances with no focal abnormality and no ductal dilatation is seen. No calcified gallstones are noted. The pancreas is normal in size although somewhat atrophic. The pancreatic duct is not dilated. The adrenal glands and spleen are unremarkable. The stomach is moderately fluid distended with no abnormality noted. The kidneys enhance with no calculus or mass and no hydronephrosis is seen. On delayed images, the pelvocaliceal systems are unremarkable. The proximal ureters are normal in caliber.  The abdominal aorta appears normal. No adenopathy is noted. Small mesenteric nodes are present none of which are pathologically enlarged. There is low attenuation within the uterus in this patient with recently diagnosed endometrial carcinoma. No pelvic adenopathy is seen. The ovaries are within normal limits in size. No free fluid is seen within the pelvis. The urinary bladder is moderately distended with no abnormality noted. No abnormality of the colon is seen. The terminal ileum is unremarkable. The appendix is visualized filling with air and extending cephalad to the left of midline, with no inflammatory process noted. There is a 4 mm anterolisthesis of L4 on L5 which appears to be due to degenerative change involving the facet joints.  IMPRESSION:  1. Abnormal appearance of the uterus in this patient with recently diagnosed endometrial carcinoma. No evidence of metastatic involvement of the abdomen or pelvis and no adenopathy is seen.  2. 4 mm anterolisthesis of L4 on L5 which appears to be due to degenerative change   [facility] BSO LND was planned for 03/2013.  HgbA1C returned 10.6.  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    On September 13 she underwent a robotic-assisted total laparoscopic hysterectomy bilateral salpingo-oophorectomy bilateral pelvic lymph node dissection. Pathology was notable for an invasive endometrioid grade 1 carcinoma invading 9 mm where the myometrium is 19 mm. Tumor size was 1.8 cm. There was no lymphovascular space involvement and 0 of 10 lymph nodes were involved. On evaluation of the distal adnexa the left distal fimbriated end of the fallopian tubes showed a low  grade adenocarcinoma with superficial stromal invasion present.  The morphologic features of the fimbrial tumor were completely different from the endometrial tumor. The satellite instability was not appreciated in the endometrial tumor.  The patient  presents today for post op check.  She received  6 cycles Taxol/Carbo completed 01/2014.    CT 03/11/2014 IMPRESSION:  1. No CT evidence of recurrent or metastatic disease identified within the abdomen and pelvis.  2. No acute intra-abdominal or pelvic process identified.  3. Small fat containing paraumbilical hernia.   Review of Systems   Denies vaginal bleeding, no abdominal pain, no n/v, no visual changes.  Past Surgical Hx:  Past Surgical History  Procedure Laterality Date  . Uterine fibroid surgery  20 YRS AGO  . Dilation and curettage of uterus N/A 03/26/2013    Procedure: DILATATION AND CURETTAGE with insertion of Mirena IUD;  Surgeon: Janie Morning, MD;  Location: [facility];  Service: Gynecology;  Laterality: N/A;  to start at 0745 per nancy. placement of Mirena.  . Robotic assisted total hysterectomy with bilateral salpingo oopherectomy N/A 09/17/2013    Procedure: ROBOTIC ASSISTED TOTAL HYSTERECTOMY WITH BILATERAL SALPINGO OOPHORECTOMY;  Surgeon: Janie Morning, MD;  Location: [facility];  Service: Gynecology;  Laterality: N/A;  . Lymph node dissection N/A 09/17/2013    Procedure: LYMPH NODE DISECTION/STAGGING;  Surgeon: Janie Morning, MD;  Location: [facility];  Service: Gynecology;  Laterality: N/A;    Past Medical Hx:  Past Medical History  Diagnosis Date  . Postmenopausal bleeding   . Diabetes mellitus without complication   . Hyperlipidemia   . Cancer     endometrial  Left Fallopian tube cancer   Family Hx:  Family History   Problem  Relation  Age of Onset   .  Diabetes  Mother    .  Hypertension  Mother    .  Breast cancer  Mother    .  Diabetes  Father    Vitals:  Physical Exam:  Vitals BP 146/66  Pulse 104  Temp(Src) 97.9 F (36.6 C)  Resp 16  Ht 5\' 3"  (1.6 m)  Wt 158 lb 12.8 oz (72.031 kg)  BMI 28.14 kg/m2 WD female in NAD CHEST:  CTA CARDIAC: RRR ABD:  Soft NT port sites without erythema or hernia Pelvic:  Nl EGBUS, Cuff intact, no blood no cul de sac  masses Ext:  No CCE

## 2014-03-13 ENCOUNTER — Ambulatory Visit: Payer: BC Managed Care – PPO | Attending: Gynecologic Oncology | Admitting: Gynecologic Oncology

## 2014-03-13 ENCOUNTER — Encounter: Payer: Self-pay | Admitting: Gynecologic Oncology

## 2014-03-13 VITALS — BP 117/60 | HR 93 | Temp 98.8°F | Resp 16 | Ht 63.0 in | Wt 163.8 lb

## 2014-03-13 DIAGNOSIS — C541 Malignant neoplasm of endometrium: Secondary | ICD-10-CM

## 2014-03-13 DIAGNOSIS — C549 Malignant neoplasm of corpus uteri, unspecified: Secondary | ICD-10-CM | POA: Insufficient documentation

## 2014-03-13 DIAGNOSIS — C5700 Malignant neoplasm of unspecified fallopian tube: Secondary | ICD-10-CM | POA: Insufficient documentation

## 2014-03-13 DIAGNOSIS — E119 Type 2 diabetes mellitus without complications: Secondary | ICD-10-CM | POA: Insufficient documentation

## 2014-03-13 DIAGNOSIS — E785 Hyperlipidemia, unspecified: Secondary | ICD-10-CM | POA: Insufficient documentation

## 2014-03-13 DIAGNOSIS — Z794 Long term (current) use of insulin: Secondary | ICD-10-CM | POA: Insufficient documentation

## 2014-03-13 DIAGNOSIS — Z90710 Acquired absence of both cervix and uterus: Secondary | ICD-10-CM | POA: Insufficient documentation

## 2014-03-13 DIAGNOSIS — Z9079 Acquired absence of other genital organ(s): Secondary | ICD-10-CM | POA: Insufficient documentation

## 2014-03-13 NOTE — Progress Notes (Addendum)
Office Visit:  GYN ONCOLOGY  CC:  Chief Complaint   Patient presents with   .  endometrial cancer  Stage IA  Fallopian tube cancer Unstaged  Assessment/Plan:  58 y.o. with a synchronous endometrial and  fallopian tube adenocarcinoma Stage IA grade 1 endometrial carcinoma   there is no lymphovascular space invasion there is less than 50% myometrial invasion.  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  This represents a very early fallopian tube cancer that was only detected on microscopic evaluation. As such omentectomy and periaortic nodes were not assessed at the time of surgery last week.   Received 6 cycles taxol/carboplatin completed 01/2014  Counseled on the signs and symptoms of recurrence  A referral was made for genetic counselling and testing given the patients synchronous uterine/fallopian tube malignancy and her mother's breast cancer diagnosis. Follow-up with Dr. Marko Plume as scheduled Follow-up with Gyn Oncology in 06/2014  HPI:  Makayla Buchanan was initially  seen at the request of Dr. Dellis Filbert a few months ago  for a newly diagnosed endometrial cancer.  She's been menopausal for essentially 5 years. She never took any hormone replacement therapy. Due to the persistent nature for spotting she went to urgent care and his was referred to Dr. Dellis Filbert.  Dr. Dellis Filbert performed an endometrial biopsy on February 13 2013. 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  She also had a CT scan of the abdomen and pelvis. IMPRESSION:  1. Abnormal appearance of the uterus in this patient with recently diagnosed endometrial carcinoma. No evidence of metastatic involvement of the abdomen or pelvis and no adenopathy is seen.  2. 4 mm anterolisthesis of L4 on L5 which appears to be due to degenerative change   [facility] BSO LND was planned for 03/2013.  HgbA1C was elevated and instead a  Mirena IUD was  placed and the  patinet was advised to secure better endocrinologic management of her IDDM.    On August 31, 2013 she underwent a robotic-assisted total laparoscopic hysterectomy bilateral salpingo-oophorectomy bilateral pelvic lymph node dissection. Pathology was notable for an invasive endometrioid grade 1 carcinoma invading 9 mm where the myometrium is 19 mm. Tumor size was 1.8 cm. There was no lymphovascular space involvement and 0 of 10 lymph nodes were involved. On evaluation of the distal adnexa the left distal fimbriated end of the fallopian tubes showed a low grade adenocarcinoma with superficial stromal invasion present.  The morphologic features of the fimbrial tumor were completely different from the endometrial tumor. Microsatellite instability was not appreciated in the endometrial tumor.   She received  6 cycles Taxol/Carbo completed 01/2014.    CT 03/11/2014 IMPRESSION:  1. No CT evidence of recurrent or metastatic disease identified within the abdomen and pelvis.  2. No acute intra-abdominal or pelvic process identified.  3. Small fat containing paraumbilical hernia.   Review of Systems   Denies vaginal bleeding, no abdominal pain, no n/v, no visual changes. 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  Denies falls.  No vaginal or rectal bleeding.  No SOB no chest pain.  Otherwise ROS negative  Past Surgical Hx:  Past Surgical History  Procedure Laterality Date  . Uterine fibroid surgery  20 YRS AGO  . Dilation and curettage of uterus N/A 03/26/2013    Procedure: DILATATION AND CURETTAGE with insertion of Mirena IUD;  Surgeon: Janie Morning, MD;  Location: [facility];  Service: Gynecology;  Laterality: N/A;  to start at 0745 per nancy. placement of Mirena.  . Robotic assisted total hysterectomy with bilateral salpingo oopherectomy N/A 09/17/2013    Procedure: ROBOTIC ASSISTED TOTAL HYSTERECTOMY WITH BILATERAL SALPINGO OOPHORECTOMY;  Surgeon: Janie Morning, MD;  Location: [facility];  Service: Gynecology;  Laterality: N/A;  . Lymph node dissection N/A 09/17/2013    Procedure: LYMPH NODE DISECTION/STAGGING;  Surgeon: Janie Morning, MD;  Location: [facility];  Service: Gynecology;  Laterality: N/A;    Past Medical Hx:  Past Medical History  Diagnosis Date  . Postmenopausal bleeding   . Diabetes mellitus without complication   . Hyperlipidemia   . Cancer     endometrial  Left Fallopian tube cancer   Family Hx:  Family History   Problem  Relation  Age of Onset   .  Diabetes  Mother    .  Hypertension  Mother    .  Breast cancer  Mother    .  Diabetes  Father    Vitals:  Physical Exam:  Vitals BP 117/60  Pulse 93  Temp(Src) 98.8 F (37.1 C) (Oral)  Resp 16  Ht _0  (1.6 m)  Wt 163 lb 12.8 oz (74.299 kg)  BMI 29.02 kg/m2  SpO2 100% Wt Readings from Last 3 Encounters:  03/13/14 163 lb 12.8 oz (74.299 kg)  03/03/14 162 lb (73.483 kg)  01/29/14 161 lb 4.8 oz (73.165 kg)   WD female in NAD CHEST:  CTA CARDIAC: RRR ABD:  Soft NT port sites without erythema or hernia Pelvic:  Nl EGBUS, Cuff intact, no blood no cul de sac masses Rectal:  Good tone no masses Back:  No CVAT LN:  No cervical supraclavicular or inguinal adenopathy Ext:  No CCE

## 2014-03-13 NOTE — Patient Instructions (Signed)
No evidence of cancer Follow-up with Dr. Marko Plume as scheduled Follow-up with Gyn Oncology 06/2014    Thank you very much Makayla Buchanan for allowing me to provide care for you today.  I appreciate your confidence in choosing our Gynecologic Oncology team.  If you have any questions about your visit today please call our office and we will get back to you as soon as possible.  Francetta Found. Dabria Wadas MD., PhD Gynecologic Oncology

## 2014-03-21 ENCOUNTER — Encounter: Payer: Self-pay | Admitting: *Deleted

## 2014-04-13 ENCOUNTER — Other Ambulatory Visit: Payer: Self-pay | Admitting: Oncology

## 2014-04-14 ENCOUNTER — Other Ambulatory Visit (HOSPITAL_BASED_OUTPATIENT_CLINIC_OR_DEPARTMENT_OTHER): Payer: BC Managed Care – PPO

## 2014-04-14 ENCOUNTER — Encounter: Payer: Self-pay | Admitting: Oncology

## 2014-04-14 ENCOUNTER — Other Ambulatory Visit: Payer: Self-pay | Admitting: Oncology

## 2014-04-14 ENCOUNTER — Ambulatory Visit (HOSPITAL_BASED_OUTPATIENT_CLINIC_OR_DEPARTMENT_OTHER): Payer: BC Managed Care – PPO | Admitting: Oncology

## 2014-04-14 VITALS — BP 110/59 | HR 95 | Temp 98.1°F | Resp 20 | Ht 63.0 in | Wt 160.6 lb

## 2014-04-14 DIAGNOSIS — G629 Polyneuropathy, unspecified: Secondary | ICD-10-CM

## 2014-04-14 DIAGNOSIS — C5700 Malignant neoplasm of unspecified fallopian tube: Secondary | ICD-10-CM

## 2014-04-14 DIAGNOSIS — C541 Malignant neoplasm of endometrium: Secondary | ICD-10-CM

## 2014-04-14 DIAGNOSIS — G609 Hereditary and idiopathic neuropathy, unspecified: Secondary | ICD-10-CM

## 2014-04-14 DIAGNOSIS — D6481 Anemia due to antineoplastic chemotherapy: Secondary | ICD-10-CM

## 2014-04-14 DIAGNOSIS — G589 Mononeuropathy, unspecified: Secondary | ICD-10-CM

## 2014-04-14 DIAGNOSIS — C549 Malignant neoplasm of corpus uteri, unspecified: Secondary | ICD-10-CM

## 2014-04-14 DIAGNOSIS — T451X5A Adverse effect of antineoplastic and immunosuppressive drugs, initial encounter: Secondary | ICD-10-CM

## 2014-04-14 LAB — URINALYSIS, MICROSCOPIC - CHCC
Bilirubin (Urine): NEGATIVE
GLUCOSE UR CHCC: NEGATIVE mg/dL
KETONES: NEGATIVE mg/dL
Nitrite: POSITIVE
PH: 6 (ref 4.6–8.0)
Protein: 30 mg/dL
Specific Gravity, Urine: 1.02 (ref 1.003–1.035)
Urobilinogen, UR: 0.2 mg/dL (ref 0.2–1)

## 2014-04-14 LAB — CBC WITH DIFFERENTIAL/PLATELET
BASO%: 0.1 % (ref 0.0–2.0)
Basophils Absolute: 0 10*3/uL (ref 0.0–0.1)
EOS ABS: 0.1 10*3/uL (ref 0.0–0.5)
EOS%: 0.8 % (ref 0.0–7.0)
HEMATOCRIT: 31.4 % — AB (ref 34.8–46.6)
HEMOGLOBIN: 10.1 g/dL — AB (ref 11.6–15.9)
LYMPH#: 3.3 10*3/uL (ref 0.9–3.3)
LYMPH%: 41.8 % (ref 14.0–49.7)
MCH: 31.4 pg (ref 25.1–34.0)
MCHC: 32.2 g/dL (ref 31.5–36.0)
MCV: 97.5 fL (ref 79.5–101.0)
MONO#: 0.7 10*3/uL (ref 0.1–0.9)
MONO%: 9.3 % (ref 0.0–14.0)
NEUT%: 48 % (ref 38.4–76.8)
NEUTROS ABS: 3.8 10*3/uL (ref 1.5–6.5)
Platelets: 150 10*3/uL (ref 145–400)
RBC: 3.22 10*6/uL — ABNORMAL LOW (ref 3.70–5.45)
RDW: 13.1 % (ref 11.2–14.5)
WBC: 7.9 10*3/uL (ref 3.9–10.3)
nRBC: 0 % (ref 0–0)

## 2014-04-14 MED ORDER — GABAPENTIN 100 MG PO CAPS: 100.0000 mg | ORAL_CAPSULE | Freq: Every day | ORAL | Status: DC

## 2014-04-14 NOTE — Progress Notes (Signed)
OFFICE PROGRESS NOTE   04/14/2014   Physicians:Wendy Brewster, ML Lavoie, D.Lucianne Lei, K.Heckler   INTERVAL HISTORY:  Patient is seen, alone for visit, in follow up of recently completed adjuvant chemotherapy given for clinically unstaged low grade fallopian carcinoma; last chemo was cycle 5 taxol carboplatin 02-05-14 (cycle 6 omitted due to cytopenias and peripheral neuropathy). She saw Dr Skeet Latch after CT AP 03-11-14, and will see her again in July. I believe referral was made to genetics, tho I do not find that appointment in EMR yet. Patient has felt progressively better as she gets further out from chemo, other than persistent neuropathy "like sandpaper" in feet, which makes it difficult for her to go to sleep at night. 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 Appetite is better, still some taste changes.  Blood sugars are much better, follow up with Dr Coletta Memos in May.  No central catheter  ONCOLOGIC HISTORY Oncology History   Stage 1 grade 1 at [facility] BSO and pelvic lymph node dissection 09-17-13, with <50% myometrial invasion and no lymphovascular space invasion     Endometrial cancer   03/07/2013 Initial Diagnosis Endometrial cancer  Patient presented to Dr Dellis Filbert with ~ 6 months of vaginal spotting. Endometrial biopsy in Feb 2014 found grade 1 endometrial carcinoma. At that time the patient had not seen physician for diabetes in 4 years and was not checking blood sugars at home, tho she did continue to take insulin (patient states that she "did not need prescription" for the insulin); lack of medical care due to no insurance at that time. CT AP in [facility] system 02-26-2013 showed low attenuation in uterus but otherwise no findings suggesting metastatic disease and adnexa not remarkable. D and C was done and Mirena IUD placed by Dr Skeet Latch on 03-26-2013, with pathology [phone] endometrioid adenocarcinoma with squamous differentiation, grade 1. Patient  established with Dr Coletta Memos and diabetes was controlled adequately to allow surgery by 09-17-13, this robotic [facility], BSO and bilateral pelvic node dissection by Dr Skeet Latch. 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 Post operative course was uncomplicated. Dr Skeet Latch recommended 6 cycles of taxol carboplatin particularly as omentectomy and periaortic nodes were not included in the surgical staging; dose dense regimen was chosen due to diabetic neuropathy and tolerance concerns. Cycle 1 day 1 was given 10-16-13. She was transfused PRBCs 12-18-13 for Hgb 7.6 and on 01-31-14 for hgb 7.8. Day 15 cycle 5 was 02-05-14, with chemotherapy ended then.   Review of systems as above, also: Vision recovered OD after bleed and she has been released by Dr Baird Cancer. No pain, no SOB, bowels ok, no LE swelling. Occasional numbness right hand while sleeping at night only. Remainder of 10 point Review of Systems negative.  Objective:  Vital signs in last 24 hours:  BP 110/59  Pulse 95  Temp(Src) 98.1 F (36.7 C) (Oral)  Resp 20  Ht 5\' 3"  (1.6 m)  Wt 160 lb 9.6 oz (72.848 kg)  BMI 28.46 kg/m2 weight is down 1.5 lbs.  Alert, oriented and appropriate. Ambulatory without difficulty.  Hair is growing back  HEENT:PERRL, sclerae not icteric. Oral mucosa moist without lesions, posterior pharynx clear. Mucous membranes less pale Neck supple.  No JVD.  Lymphatics:no cervical,suraclavicular, axillary or inguinal adenopathy Resp: clear to auscultation bilaterally and normal percussion bilaterally Cardio: regular rate and rhythm. No gallop. GI: soft, nontender, not distended, no mass or  organomegaly. Normally active bowel sounds. Surgical incision not remarkable. Musculoskeletal/ Extremities: without pitting edema, cords, tenderness Neuro: peripheral neuropathy feet as above. Otherwise nonfocal Skin without rash, ecchymosis, petechiae Breasts: without dominant mass, skin or nipple findings. Axillae benign.   Lab Results:  Results for orders placed in visit on 04/14/14  CBC WITH DIFFERENTIAL      Result Value Ref Range   WBC 7.9  3.9 - 10.3 10e3/uL   NEUT# 3.8  1.5 - 6.5 10e3/uL   HGB 10.1 (*) 11.6 - 15.9 g/dL   HCT 31.4 (*) 34.8 - 46.6 %   Platelets 150  145 - 400 10e3/uL   MCV 97.5  79.5 - 101.0 fL   MCH 31.4  25.1 - 34.0 pg   MCHC 32.2  31.5 - 36.0 g/dL   RBC 3.22 (*) 3.70 - 5.45 10e6/uL   RDW 13.1  11.2 - 14.5 %   lymph# 3.3  0.9 - 3.3 10e3/uL   MONO# 0.7  0.1 - 0.9 10e3/uL   Eosinophils Absolute 0.1  0.0 - 0.5 10e3/uL   Basophils Absolute 0.0  0.0 - 0.1 10e3/uL   NEUT% 48.0  38.4 - 76.8 %   LYMPH% 41.8  14.0 - 49.7 %   MONO% 9.3  0.0 - 14.0 %   EOS% 0.8  0.0 - 7.0 %   BASO% 0.1  0.0 - 2.0 %   nRBC 0  0 - 0 %    CA 125 on 03-03-14 4.4 CMET on 03-03-14 had glucose 214, chloride 110, CO2 21 otherwise normal  Studies/Results: CT ABDOMEN AND PELVIS WITH CONTRAST  03-11-14  CONTRAST: 181mL OMNIPAQUE IOHEXOL 300 MG/ML SOLN  COMPARISON: Prior CT from 02/26/2013  FINDINGS:  The visualized lung bases are clear.  The liver demonstrates a normal unenhanced appearance. No focal  intrahepatic masses. The gallbladder is within normal limits. No  biliary ductal dilatation. The spleen, adrenal glands, and pancreas  demonstrate a normal contrast enhanced appearance.  The kidneys are equal in size with symmetric enhancement. No  nephrolithiasis, hydronephrosis, or focal enhancing renal mass.  No evidence of bowel obstruction. No abnormal wall thickening,  mucosal enhancement, or inflammatory fat stranding seen about the  bowels. Moderate amount of retained stool  seen diffusely throughout  the colon.  Small fat containing paraumbilical hernia noted.  Mild circumferential bladder wall thickening thought to likely  related to incomplete distension.  Uterus and ovaries are absent. No soft tissue mass seen within the  pelvis. No enlarged intra-abdominal or pelvic lymph nodes are  identified.  No free air or fluid. The  No acute osseous abnormality. No worrisome lytic or blastic osseous  lesions. Trace anterior listhesis of L4 on L5 is present. Severe  bilateral facet arthrosis seen at L5-S1. Sacral position of L5  noted. No definite pars defect.  IMPRESSION:  1. No CT evidence of recurrent or metastatic disease identified  within the abdomen and pelvis.  2. No acute intra-abdominal or pelvic process identified.  3. Small fat containing paraumbilical hernia.  Mammograms were 12-30-13 [facility]   Medications: I have reviewed the patient's current medications. Will try gabapentin 100 mg at hs for neuropathy; patient to let us know how she does with this and dose can be increased if needed. She is aware that  she may be drowsy with gabapentin at least initially.  DISCUSSION: will repeat CBC with Dr Leone Brand visit in July. Gabapentin as above.  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 Follow up gyn oncology as noted and at this office  2.stage 1 grade 1 endometrial carcinoma: treated surgically, no adjuvant chemo or RT recommended  3.diabetes x 20 years: next visit with Dr Coletta Memos upcoming. 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 Trial of gabapentin beginning 100 mg at hs, which can be increased if tolerated and if needed. Patient can discuss with this office by phone prior to next appointment. 6.Chemo anemia improving, WBC recovered. Will recheck CBC when she is at this office for gyn onc visit in July and with my next visit. 7.dental concerns: seen by [place], patient declined intervention now. No active symptoms now  8. Diagnostic bilateral mammograms at [facility] 01-01-2014 with benign appearing calcifications, report scanned into this EMR    Patient is in agreement with plan above.   Gordy Levan, MD   04/14/2014, 3:43 PM

## 2014-04-15 ENCOUNTER — Telehealth: Payer: Self-pay

## 2014-04-15 DIAGNOSIS — N390 Urinary tract infection, site not specified: Secondary | ICD-10-CM

## 2014-04-15 MED ORDER — CIPROFLOXACIN HCL 250 MG PO TABS: 250.0000 mg | ORAL_TABLET | Freq: Two times a day (BID) | ORAL | Status: DC

## 2014-04-15 NOTE — Telephone Encounter (Signed)
Message copied by Baruch Merl on Tue Apr 15, 2014  2:18 PM ------      Message from: Gordy Levan      Created: Tue Apr 15, 2014  1:47 PM       Labs seen and need follow up: please start cipro 250 mg bid x 7 days. Need to follow up culture in case this choice is not correct. ------

## 2014-04-15 NOTE — Telephone Encounter (Signed)
Spoke with Ms. Sica and told her the results of U/ a from 04-14-14 and to begin Cipro this evening. Prescription e-prescribed to [facility].

## 2014-04-16 ENCOUNTER — Telehealth: Payer: Self-pay

## 2014-04-16 LAB — URINE CULTURE

## 2014-04-16 NOTE — Telephone Encounter (Signed)
Told Ms. Subia the result of the urine culture as noted below by Dr. Marko Plume.  Ms. Hewett verbalized understanding.

## 2014-04-16 NOTE — Telephone Encounter (Signed)
Message copied by Baruch Merl on Wed Apr 16, 2014  4:27 PM ------      Message from: Evlyn Clines P      Created: Wed Apr 16, 2014  8:38 AM       Labs seen and need follow up please let patient know she does have UTI and cipro is good choice for it. If any symptoms after she completes all of the cipro, she needs follow up urine check here or with PCP. ------

## 2014-05-01 ENCOUNTER — Other Ambulatory Visit: Payer: Self-pay | Admitting: *Deleted

## 2014-05-01 DIAGNOSIS — C5700 Malignant neoplasm of unspecified fallopian tube: Secondary | ICD-10-CM

## 2014-05-01 DIAGNOSIS — G629 Polyneuropathy, unspecified: Secondary | ICD-10-CM

## 2014-05-01 DIAGNOSIS — C541 Malignant neoplasm of endometrium: Secondary | ICD-10-CM

## 2014-05-01 MED ORDER — GABAPENTIN 100 MG PO CAPS: 100.0000 mg | ORAL_CAPSULE | Freq: Every day | ORAL | Status: DC

## 2014-07-10 ENCOUNTER — Encounter: Payer: Self-pay | Admitting: Gynecologic Oncology

## 2014-07-10 ENCOUNTER — Ambulatory Visit: Payer: BC Managed Care – PPO | Attending: Gynecologic Oncology | Admitting: Gynecologic Oncology

## 2014-07-10 VITALS — BP 122/61 | HR 89 | Temp 98.4°F | Resp 18 | Ht 63.0 in | Wt 163.5 lb

## 2014-07-10 DIAGNOSIS — Z9221 Personal history of antineoplastic chemotherapy: Secondary | ICD-10-CM

## 2014-07-10 DIAGNOSIS — C549 Malignant neoplasm of corpus uteri, unspecified: Secondary | ICD-10-CM | POA: Insufficient documentation

## 2014-07-10 DIAGNOSIS — Z8542 Personal history of malignant neoplasm of other parts of uterus: Secondary | ICD-10-CM

## 2014-07-10 DIAGNOSIS — Z8544 Personal history of malignant neoplasm of other female genital organs: Secondary | ICD-10-CM

## 2014-07-10 DIAGNOSIS — C541 Malignant neoplasm of endometrium: Secondary | ICD-10-CM

## 2014-07-10 DIAGNOSIS — Z90710 Acquired absence of both cervix and uterus: Secondary | ICD-10-CM | POA: Insufficient documentation

## 2014-07-10 DIAGNOSIS — C5702 Malignant neoplasm of left fallopian tube: Secondary | ICD-10-CM

## 2014-07-10 NOTE — Progress Notes (Signed)
Office Visit:  GYN ONCOLOGY  CC:  Chief Complaint   Patient presents with   .  endometrial cancer  Stage IA  Fallopian tube cancer Unstaged  Assessment/Plan:  58 y.o. with a synchronous endometrial and  fallopian tube adenocarcinoma.  Stage IA grade 1 endometrial carcinoma   there is no lymphovascular space invasion there is less than 50% myometrial invasion.  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  This represents a very early fallopian tube cancer that was only detected on microscopic evaluation. As such omentectomy and periaortic nodes were not assessed at the time of surgery.  Received 6 cycles taxol/carboplatin completed 01/2014.  Counseled on the signs and symptoms of recurrence  Follow-up with Dr. Marko Plume as scheduled 09/2014  Follow-up with Gyn Oncology in 12/2014  Pap at next visit   HPI:  Makayla Buchanan was initially  seen at the request of Dr. Dellis Filbert a few months ago  for a newly diagnosed endometrial cancer.  She's been menopausal for essentially 5 years. She never took any hormone replacement therapy. Due to the persistent nature for spotting she went to urgent care and his was referred to Dr. Dellis Filbert.  Dr. Dellis Filbert performed an endometrial biopsy on February 13 2013. 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  She also had a CT scan of the abdomen and pelvis. IMPRESSION:  1. Abnormal appearance of the uterus in this patient with recently diagnosed endometrial carcinoma. No evidence of metastatic involvement of the abdomen or pelvis and no adenopathy is seen.  2. 4 mm anterolisthesis of L4 on L5 which appears to be due to degenerative change   R[facility] BSO LND was planned for 03/2013.  HgbA1C was elevated and instead a  Mirena IUD was placed and the  patinet was advised to secure better endocrinologic management of her IDDM.    On August 31, 2013 she underwent a  robotic-assisted total laparoscopic hysterectomy bilateral salpingo-oophorectomy bilateral pelvic lymph node dissection. Pathology was notable for an invasive endometrioid grade 1 carcinoma invading 9 mm where the myometrium is 19 mm. Tumor size was 1.8 cm. There was no lymphovascular space involvement and 0 of 10 lymph nodes were involved. On evaluation of the distal adnexa the left distal fimbriated end of the fallopian tubes showed a low grade adenocarcinoma with superficial stromal invasion present.  The morphologic features of the fimbrial tumor were completely different from the endometrial tumor. Microsatellite instability was not appreciated in the endometrial tumor.   She received  6 cycles Taxol/Carbo completed 01/2014.    CT 03/11/2014 IMPRESSION:  1. No CT evidence of recurrent or metastatic disease identified within the abdomen and pelvis.  2. No acute intra-abdominal or pelvic process identified.  3. Small fat containing paraumbilical hernia.   Review of Systems   Denies vaginal bleeding, no abdominal pain, no n/v, no visual changes. 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  Denies falls.  No vaginal or rectal bleeding.  No SOB no chest pain.  Otherwise ROS negative  Past Surgical Hx:  Past Surgical History  Procedure Laterality Date  . Uterine fibroid surgery  20 YRS AGO  . Dilation and curettage of uterus N/A 03/26/2013    Procedure: DILATATION AND CURETTAGE with insertion of Mirena IUD;  Surgeon: Janie Morning, MD;  Location: [facility];  Service: Gynecology;  Laterality: N/A;  to start at 0745 per nancy. placement of Mirena.  . Robotic assisted  total hysterectomy with bilateral salpingo oopherectomy N/A 09/17/2013    Procedure: ROBOTIC ASSISTED TOTAL HYSTERECTOMY WITH BILATERAL SALPINGO OOPHORECTOMY;  Surgeon: Janie Morning, MD;  Location: [facility];  Service: Gynecology;  Laterality: N/A;  . Lymph node dissection N/A 09/17/2013    Procedure: LYMPH NODE DISECTION/STAGGING;   Surgeon: Janie Morning, MD;  Location: [facility];  Service: Gynecology;  Laterality: N/A;    Past Medical Hx:  Past Medical History  Diagnosis Date  . Postmenopausal bleeding   . Diabetes mellitus without complication   . Hyperlipidemia   . Cancer     endometrial  Left Fallopian tube cancer   Family Hx:  Family History   Problem  Relation  Age of Onset   .  Diabetes  Mother    .  Hypertension  Mother    .  Breast cancer  Mother    .  Diabetes  Father       Physical Exam:  Vitals BP 122/61  Pulse 89  Temp(Src) 98.4 F (36.9 C) (Oral)  Resp 18  Ht _0  (1.6 m)  Wt 163 lb 8 oz (74.163 kg)  BMI 28.97 kg/m2 Wt Readings from Last 3 Encounters:  07/10/14 163 lb 8 oz (74.163 kg)  04/14/14 160 lb 9.6 oz (72.848 kg)  03/13/14 163 lb 12.8 oz (74.299 kg)   WD female in NAD CHEST:  CTA CARDIAC: RRR ABD:  Soft NT port sites without erythema or hernia Pelvic:  Nl EGBUS, Cuff intact, no blood no cul de sac masses Rectal:  Good tone no masses Back:  No CVAT LN:  No cervical supraclavicular or inguinal adenopathy Ext:  No CCE

## 2014-07-10 NOTE — Patient Instructions (Signed)
Follow-up with Dr. Marko Plume as scheduled Follow-up with Gyn Oncology in 12/2014    Thank you very much Ms. Kaisley Stiverson for allowing me to provide care for you today.  I appreciate your confidence in choosing our Gynecologic Oncology team.  If you have any questions about your visit today please call our office and we will get back to you as soon as possible.  Please consider using the website Medlineplus.gov as an Geneticist, molecular.   Francetta Found. Keylin Podolsky MD., PhD Gynecologic Oncology

## 2014-10-03 ENCOUNTER — Other Ambulatory Visit: Payer: Self-pay

## 2014-10-12 ENCOUNTER — Other Ambulatory Visit: Payer: Self-pay | Admitting: Oncology

## 2014-10-15 ENCOUNTER — Encounter: Payer: Self-pay | Admitting: Oncology

## 2014-10-15 ENCOUNTER — Other Ambulatory Visit (HOSPITAL_BASED_OUTPATIENT_CLINIC_OR_DEPARTMENT_OTHER): Payer: BC Managed Care – PPO

## 2014-10-15 ENCOUNTER — Telehealth: Payer: Self-pay | Admitting: Oncology

## 2014-10-15 ENCOUNTER — Ambulatory Visit (HOSPITAL_BASED_OUTPATIENT_CLINIC_OR_DEPARTMENT_OTHER): Payer: BC Managed Care – PPO | Admitting: Oncology

## 2014-10-15 VITALS — BP 129/63 | HR 71 | Temp 98.0°F | Resp 18 | Ht 63.0 in | Wt 169.4 lb

## 2014-10-15 DIAGNOSIS — Z8542 Personal history of malignant neoplasm of other parts of uterus: Secondary | ICD-10-CM

## 2014-10-15 DIAGNOSIS — Z23 Encounter for immunization: Secondary | ICD-10-CM

## 2014-10-15 DIAGNOSIS — E119 Type 2 diabetes mellitus without complications: Secondary | ICD-10-CM

## 2014-10-15 DIAGNOSIS — Z8544 Personal history of malignant neoplasm of other female genital organs: Secondary | ICD-10-CM

## 2014-10-15 DIAGNOSIS — C541 Malignant neoplasm of endometrium: Secondary | ICD-10-CM

## 2014-10-15 DIAGNOSIS — C5700 Malignant neoplasm of unspecified fallopian tube: Secondary | ICD-10-CM

## 2014-10-15 DIAGNOSIS — D6481 Anemia due to antineoplastic chemotherapy: Secondary | ICD-10-CM

## 2014-10-15 DIAGNOSIS — C5702 Malignant neoplasm of left fallopian tube: Secondary | ICD-10-CM

## 2014-10-15 LAB — CBC WITH DIFFERENTIAL/PLATELET
BASO%: 0.8 % (ref 0.0–2.0)
Basophils Absolute: 0.1 10*3/uL (ref 0.0–0.1)
EOS ABS: 0.1 10*3/uL (ref 0.0–0.5)
EOS%: 0.9 % (ref 0.0–7.0)
HCT: 34.3 % — ABNORMAL LOW (ref 34.8–46.6)
HGB: 11.1 g/dL — ABNORMAL LOW (ref 11.6–15.9)
LYMPH%: 35.1 % (ref 14.0–49.7)
MCH: 29.5 pg (ref 25.1–34.0)
MCHC: 32.3 g/dL (ref 31.5–36.0)
MCV: 91.5 fL (ref 79.5–101.0)
MONO#: 0.5 10*3/uL (ref 0.1–0.9)
MONO%: 6.8 % (ref 0.0–14.0)
NEUT%: 56.4 % (ref 38.4–76.8)
NEUTROS ABS: 3.8 10*3/uL (ref 1.5–6.5)
PLATELETS: 224 10*3/uL (ref 145–400)
RBC: 3.75 10*6/uL (ref 3.70–5.45)
RDW: 13.4 % (ref 11.2–14.5)
WBC: 6.8 10*3/uL (ref 3.9–10.3)
lymph#: 2.4 10*3/uL (ref 0.9–3.3)

## 2014-10-15 LAB — COMPREHENSIVE METABOLIC PANEL (CC13)
ALT: 19 U/L (ref 0–55)
AST: 17 U/L (ref 5–34)
Albumin: 3.9 g/dL (ref 3.5–5.0)
Alkaline Phosphatase: 82 U/L (ref 40–150)
Anion Gap: 8 mEq/L (ref 3–11)
BUN: 16.3 mg/dL (ref 7.0–26.0)
CO2: 26 mEq/L (ref 22–29)
Calcium: 9.9 mg/dL (ref 8.4–10.4)
Chloride: 111 mEq/L — ABNORMAL HIGH (ref 98–109)
Creatinine: 1 mg/dL (ref 0.6–1.1)
GLUCOSE: 115 mg/dL (ref 70–140)
Potassium: 4.9 mEq/L (ref 3.5–5.1)
Sodium: 145 mEq/L (ref 136–145)
Total Bilirubin: 0.22 mg/dL (ref 0.20–1.20)
Total Protein: 7.4 g/dL (ref 6.4–8.3)

## 2014-10-15 MED ORDER — INFLUENZA VAC SPLIT QUAD 0.5 ML IM SUSY
0.5000 mL | PREFILLED_SYRINGE | Freq: Once | INTRAMUSCULAR | Status: AC
  Administered 2014-10-15: 0.5 mL via INTRAMUSCULAR
  Filled 2014-10-15: qty 0.5

## 2014-10-15 NOTE — Telephone Encounter (Signed)
, °

## 2014-10-15 NOTE — Progress Notes (Signed)
OFFICE PROGRESS NOTE   10/15/2014   Physicians:Wendy Brewster, ML Lavoie, D.Lucianne Lei, K.Heckler  INTERVAL HISTORY:  Patient is seen, alone for visit, in follow up of clinically unstaged low grade fallopian carcinoma which was treated with 5 cycles of adjuvant taxol carboplatin from 10-16-14 thru 02-05-14 (cycle 6 omitted due to cytopenias and peripheral neuropathy). She also had stage 1 grade 1 endometrial cancer at surgery 09-17-13. She saw Dr Skeet Latch 07-10-14 and will see her again in Jan 2016. Last imaging was CT AP 03-11-2014.  She has persistent peripheral neuropathy in feet, related to taxol and underlying diabetes.  Other than the peripheral neuropathy in feet from metatarsal heads to toes bilaterally, patient has felt generally well since she was seen last. The neuropathy does not bother her during the day, but makes it difficult for her to sleep at night.She has no neuropathy in hands. 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 She has no abdominal or pelvic pain, no bleeding, no LE swelling, no SOB, no bleeding.  She does not have central catheter. She was given flu vaccine today.  ONCOLOGIC HISTORY Oncology History   Stage 1 grade 1 at [facility] BSO and pelvic lymph node dissection 09-17-13, with <50% myometrial invasion and no lymphovascular space invasion     Endometrial cancer   03/07/2013 Initial Diagnosis Endometrial cancer  Patient presented to Dr Dellis Filbert with ~ 6 months of vaginal spotting. Endometrial biopsy in Feb 2014 found grade 1 endometrial carcinoma. At that time the patient had not seen physician for diabetes in 4 years and was not checking blood sugars at home, tho she did continue to take insulin (patient states that she "did not need prescription" for the insulin); lack of medical care due to no insurance at that time. CT AP in  system 02-26-2013 showed low attenuation in uterus but otherwise no findings  suggesting metastatic disease and adnexa not remarkable. D and C was done and Mirena IUD placed by Dr Skeet Latch on 03-26-2013, with pathology [phone] endometrioid adenocarcinoma with squamous differentiation, grade 1. Patient established with Dr Coletta Memos and diabetes was controlled adequately to allow surgery by 09-17-13, this robotic TAH, BSO and bilateral pelvic node dissection by Dr Skeet Latch. 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 Post operative course was uncomplicated. Dr Skeet Latch recommended 6 cycles of taxol carboplatin particularly as omentectomy and periaortic nodes were not included in the surgical staging; dose dense regimen was chosen due to diabetic neuropathy and tolerance concerns. Cycle 1 day 1 was given 10-16-13. She was transfused PRBCs 12-18-13 for Hgb 7.6 and on 01-31-14 for hgb 7.8. Day 15 cycle 5 was 02-05-14, with chemotherapy ended then.  Review of systems as above, also: No new or different pain. No new or different neurologic symptoms. Per patient, hgb A1c was 7.4 in Sept 2015 and fasting blood sugar 139 this am. Remainder of 10 point Review of Systems negative.  Objective:  Vital signs in last 24 hours:  BP 129/63  Pulse 71  Temp(Src) 98 F (36.7 C) (Oral)  Resp 18  Ht 5\' 3"  (1.6 m)  Wt 169 lb 6.4 oz (76.839 kg)  BMI 30.02 kg/m2 Weight is up 6.5 lbs. Alert,  oriented and appropriate. Ambulatory without difficulty.   No alopecia  HEENT:PERRL, sclerae not icteric. Oral mucosa moist without lesions, posterior pharynx clear.  Neck supple. No JVD.  Lymphatics:no cervical,suraclavicular, axillary or inguinal adenopathy Resp: clear to  auscultation bilaterally and normal percussion bilaterally Cardio: regular rate and rhythm. No gallop. GI: soft, nontender, not distended, no mass or organomegaly. Normally active bowel sounds. Surgical incisions from robotic procedure not remarkable. Musculoskeletal/ Extremities: without pitting edema, cords, tenderness Neuro: no peripheral neuropathy. Otherwise nonfocal Skin without rash, ecchymosis, petechiae Breasts: she did not wish to undress for breast exam  Axillae benign.   Lab Results:  Results for orders placed in visit on 10/15/14  CBC WITH DIFFERENTIAL      Result Value Ref Range   WBC 6.8  3.9 - 10.3 10e3/uL   NEUT# 3.8  1.5 - 6.5 10e3/uL   HGB 11.1 (*) 11.6 - 15.9 g/dL   HCT 34.3 (*) 34.8 - 46.6 %   Platelets 224  145 - 400 10e3/uL   MCV 91.5  79.5 - 101.0 fL   MCH 29.5  25.1 - 34.0 pg   MCHC 32.3  31.5 - 36.0 g/dL   RBC 3.75  3.70 - 5.45 10e6/uL   RDW 13.4  11.2 - 14.5 %   lymph# 2.4  0.9 - 3.3 10e3/uL   MONO# 0.5  0.1 - 0.9 10e3/uL   Eosinophils Absolute 0.1  0.0 - 0.5 10e3/uL   Basophils Absolute 0.1  0.0 - 0.1 10e3/uL   NEUT% 56.4  38.4 - 76.8 %   LYMPH% 35.1  14.0 - 49.7 %   MONO% 6.8  0.0 - 14.0 %   EOS% 0.9  0.0 - 7.0 %   BASO% 0.8  0.0 - 2.0 %  COMPREHENSIVE METABOLIC PANEL (MH96)      Result Value Ref Range   Sodium 145  136 - 145 mEq/L   Potassium 4.9  3.5 - 5.1 mEq/L   Chloride 111 (*) 98 - 109 mEq/L   CO2 26  22 - 29 mEq/L   Glucose 115  70 - 140 mg/dl   BUN 16.3  7.0 - 26.0 mg/dL   Creatinine 1.0  0.6 - 1.1 mg/dL   Total Bilirubin 0.22  0.20 - 1.20 mg/dL   Alkaline Phosphatase 82  40 - 150 U/L   AST 17  5 - 34 U/L   ALT 19  0 - 55 U/L   Total Protein 7.4  6.4 - 8.3 g/dL   Albumin 3.9  3.5 - 5.0 g/dL   Calcium 9.9  8.4 - 10.4 mg/dL   Anion Gap 8  3 - 11 mEq/L   CA 125 available after visit by new lab method 9, this 6.7 by old method today and 4.4 by old method 03-03-14.  Studies/Results:  No results found. Last mammograms 01-01-14 at [facility]; patient understands that she should have bilateral mammograms ~ 01-02-2015  Medications: I have reviewed the patient's current medications. She is on gabapentin 300 mg at hs.  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 Follow up gyn oncology Jan 2015 and at this office  2.stage 1 grade 1 endometrial carcinoma: treated surgically, no adjuvant chemo or RT recommended  3.diabetes x 20 years: treated by Dr Coletta Memos. Home CBGs improved since off chemo/ off steroids  4.diabetic retinal problems including bleed OD, treated by Dr Baird Cancer  5.preexisting peripheral neuropathy likely diabetic, with progression of peripheral neuropathy by end of cycle 5 taxol. Gabapentin 300 mg at hs, other suggestions as above. Unfortunately may not improve back to baseline.  6.Chemo anemia improving, WBC recovered. Will recheck CBC when she is at this office for gyn onc visit in July and with my next visit. 7.dental concerns: seen by [place], patient declined intervention 8. Diagnostic bilateral mammograms at [facility] 01-01-2014 with benign appearing calcifications, report scanned into this EMR. Needs bilateral mammograms in 1 year, patient aware.   Patient aware that she can call at any time if needed prior to scheduled appointment.    Jayleen Afonso P, MD   10/15/2014, 11:31 AM

## 2014-10-16 LAB — CA 125(PREVIOUS METHOD): CA 125: 6.7 U/mL (ref 0.0–30.2)

## 2014-10-16 LAB — CA 125: CA 125: 9 U/mL (ref <35)

## 2014-11-27 IMAGING — CT CT ABD-PELV W/ CM
2 of 5 series · 17 of 46 positions shown, 19 images · IV contrast (OMNIPAQUE)
Comparison: Prior CT from 02/26/2013

CLINICAL DATA: Follow-up endometrial cancer, status post
hysterectomy, chemotherapy complete.

EXAM:
CT ABDOMEN AND PELVIS WITH CONTRAST
TECHNIQUE: Multidetector CT imaging of the abdomen and pelvis was performed
using the standard protocol following bolus administration of
intravenous contrast.
CONTRAST:  100mL OMNIPAQUE IOHEXOL 300 MG/ML  SOLN

[Series 2: rtn a/p with · axial · 0.74mm/px · z∈[+930,+1305]mm · 14 of 85 slices shown, 16 images]
[im 5/85  soft-tissue]
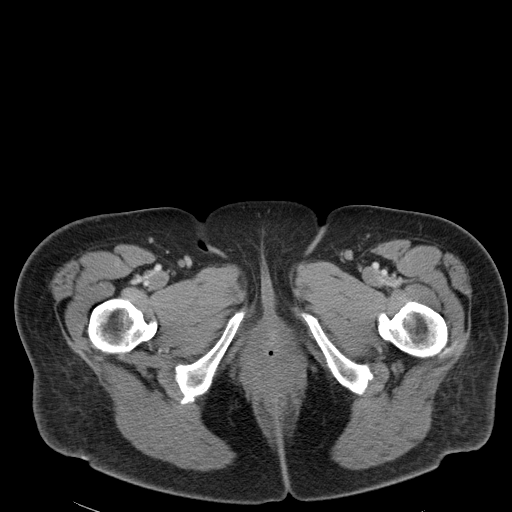
[im 5/85  bone]
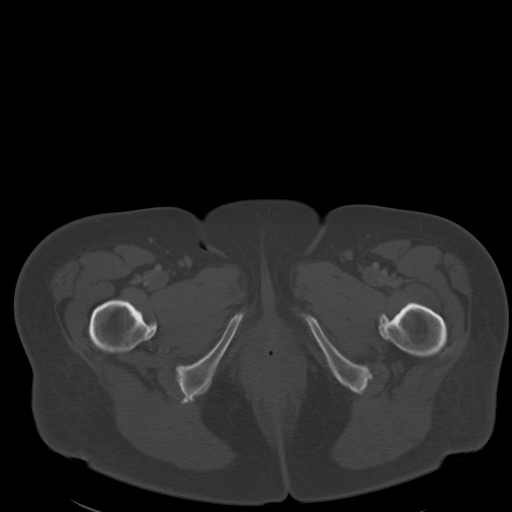
[im 13/85  soft-tissue]
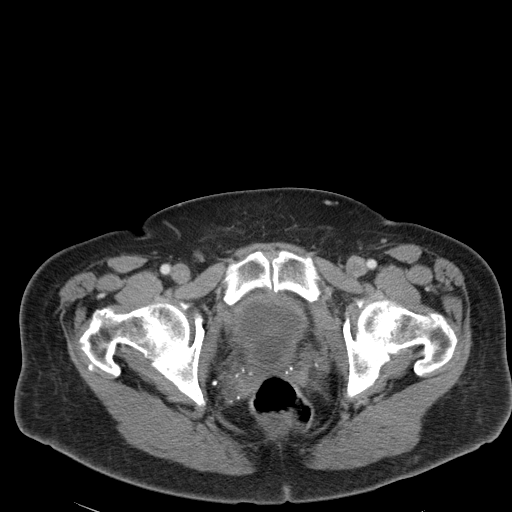
[im 17/85  soft-tissue]
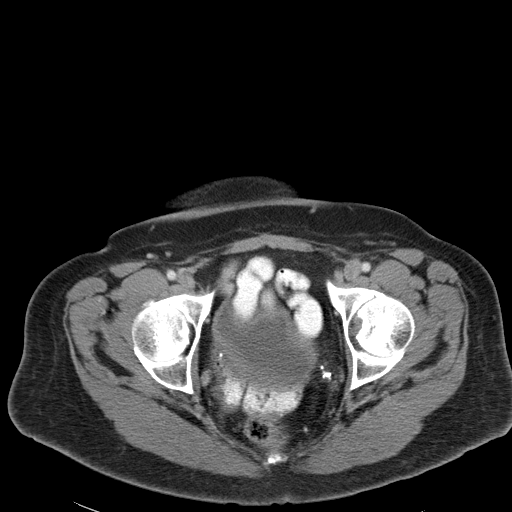
[im 22/85  soft-tissue]
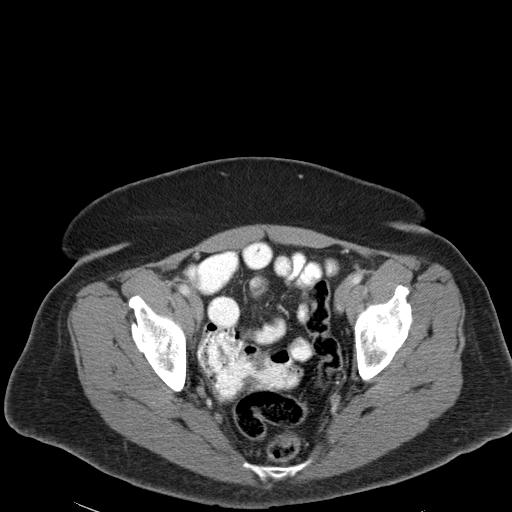
[im 30/85  soft-tissue]
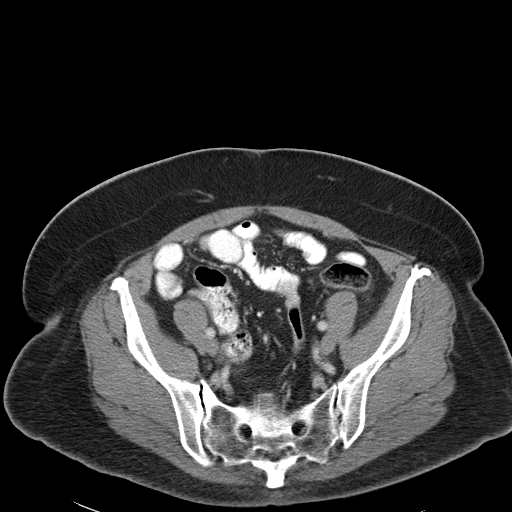
[im 34/85  soft-tissue]
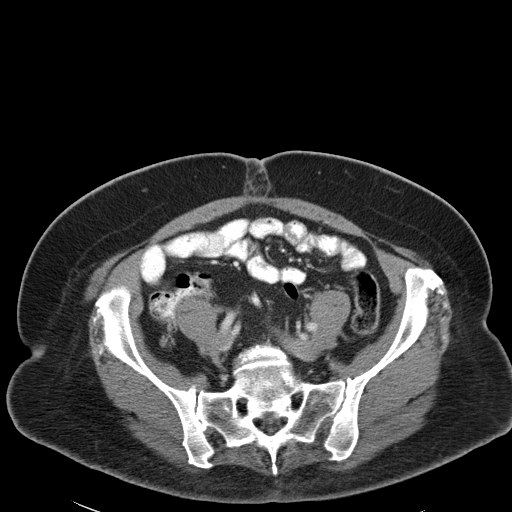
[im 38/85  soft-tissue]
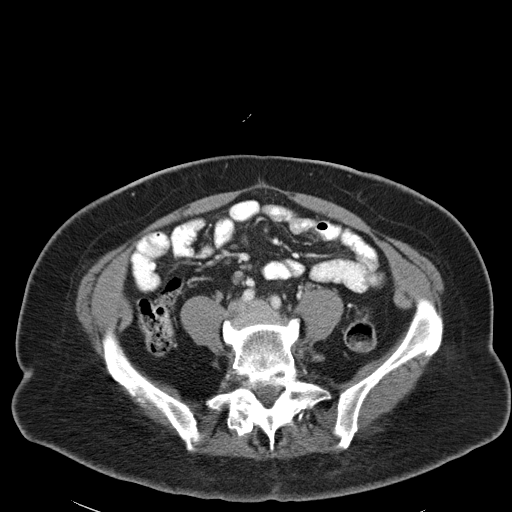
[im 47/85  soft-tissue]
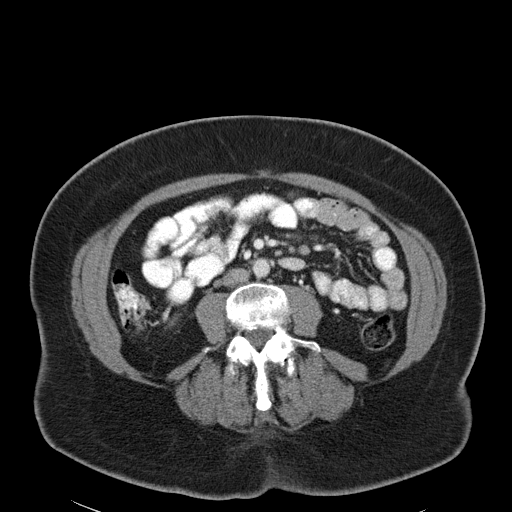
[im 51/85  soft-tissue]
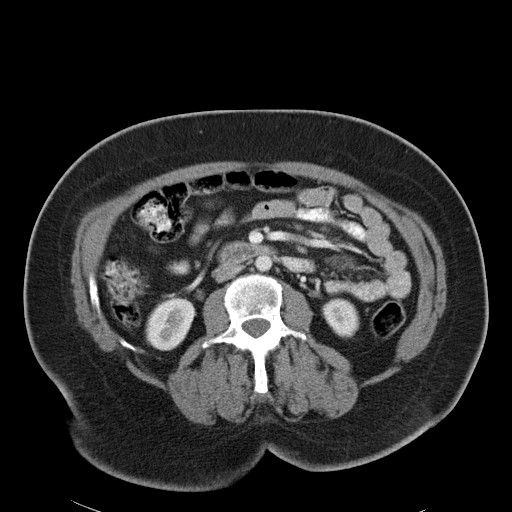
[im 51/85  bone]
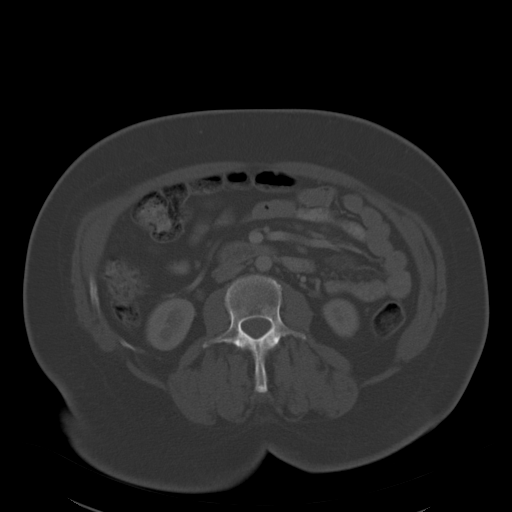
[im 55/85  soft-tissue]
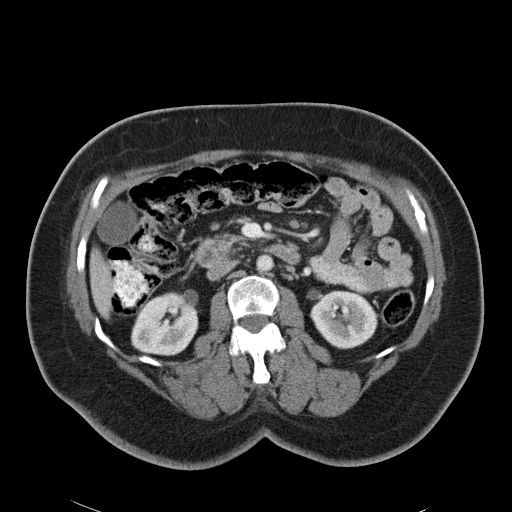
[im 64/85  soft-tissue]
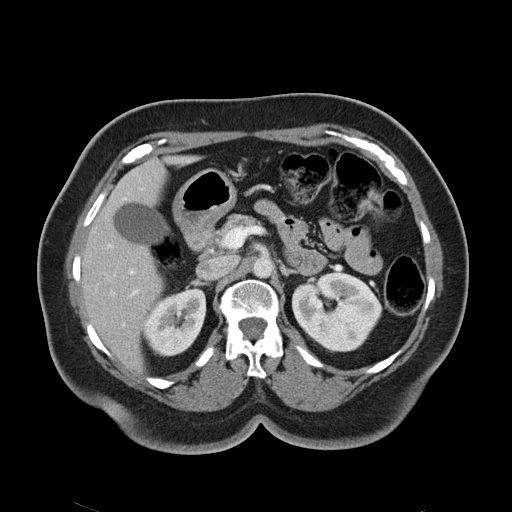
[im 68/85  soft-tissue]
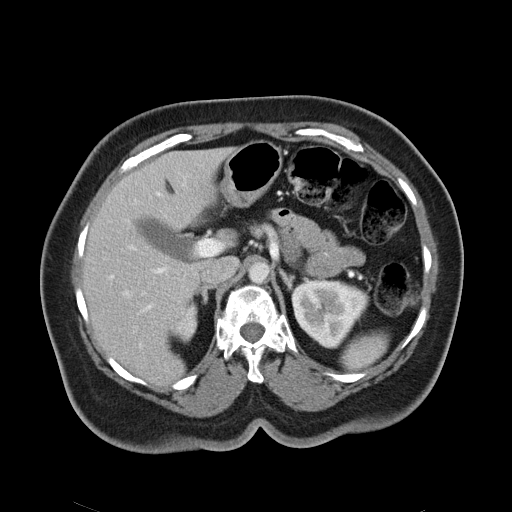
[im 72/85  soft-tissue]
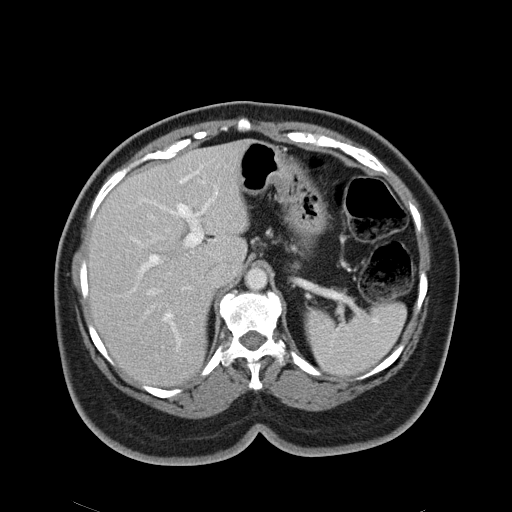
[im 80/85  soft-tissue]
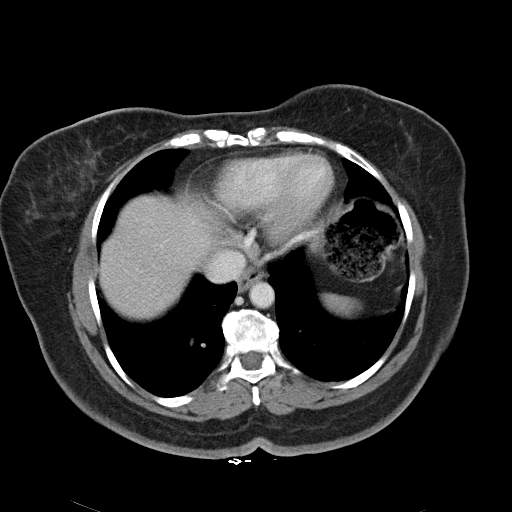

[Series 602: <mpr thick range> · coronal · 0.83mm/px · 3 of 92 slices shown]
[im 31/92  soft-tissue]
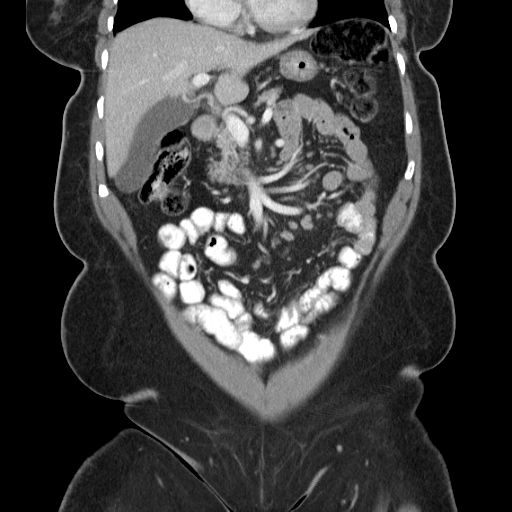
[im 41/92  soft-tissue]
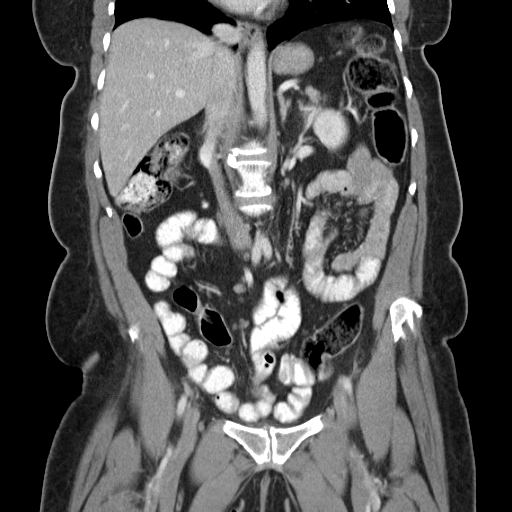
[im 51/92  soft-tissue]
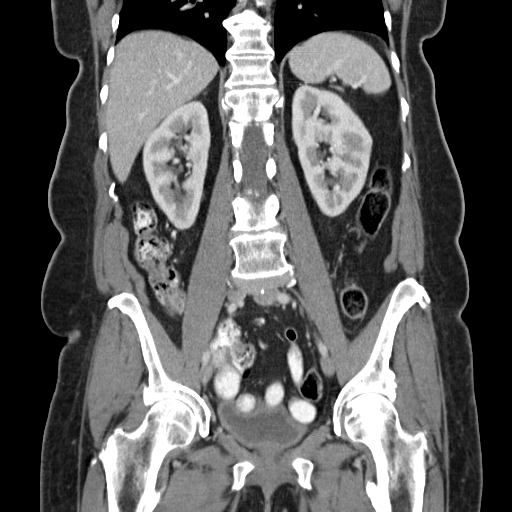

[17 of 46 positions shown; findings below may reference images not displayed]

FINDINGS: The visualized lung bases are clear.

The liver demonstrates a normal unenhanced appearance. No focal
intrahepatic masses. The gallbladder is within normal limits. No
biliary ductal dilatation. The spleen, adrenal glands, and pancreas
demonstrate a normal contrast enhanced appearance.

The kidneys are equal in size with symmetric enhancement. No
nephrolithiasis, hydronephrosis, or focal enhancing renal mass.

No evidence of bowel obstruction. No abnormal wall thickening,
mucosal enhancement, or inflammatory fat stranding seen about the
bowels. Moderate amount of retained stool seen diffusely throughout
the colon.

Small fat containing paraumbilical hernia noted.

Mild circumferential bladder wall thickening thought to likely
related to incomplete distension.

Uterus and ovaries are absent. No soft tissue mass seen within the
pelvis. No enlarged intra-abdominal or pelvic lymph nodes are
identified.

No free air or fluid.  The

No acute osseous abnormality. No worrisome lytic or blastic osseous
lesions. Trace anterior listhesis of L4 on L5 is present. Severe
bilateral facet arthrosis seen at L5-S1. Sacral position of L5
noted. No definite pars defect.
IMPRESSION: 1. No CT evidence of recurrent or metastatic disease identified
within the abdomen and pelvis.
2. No acute intra-abdominal or pelvic process identified.
3. Small fat containing paraumbilical hernia.

## 2015-01-01 ENCOUNTER — Ambulatory Visit: Payer: BC Managed Care – PPO | Admitting: Gynecologic Oncology

## 2015-01-28 ENCOUNTER — Telehealth: Payer: Self-pay | Admitting: Gynecologic Oncology

## 2015-01-28 NOTE — Telephone Encounter (Signed)
GYN ONCOLOGY OFFICE VISIT   CC:  Chief Complaint   Patient presents with   .  endometrial cancer  Stage IA  Fallopian tube cancer Unstaged  Assessment/Plan:  59 y.o. with a synchronous endometrial and  fallopian tube adenocarcinoma.  Stage IA grade 1 endometrial carcinoma   there is no lymphovascular space invasion there is less than 50% myometrial invasion.  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  This represents a very early fallopian tube cancer that was only detected on microscopic evaluation. As such omentectomy and periaortic nodes were not assessed at the time of surgery.  Received 6 cycles taxol/carboplatin completed 01/2014.  Counseled on the signs and symptoms of recurrence  Follow-up with Dr. Marko Plume as scheduled.  Follow-up with Gyn Oncology in 03/2015  Pap collected.   HPI:  Makayla Buchanan was initially  seen at the request of Dr. Dellis Filbert a few months ago  for a newly diagnosed endometrial cancer.  She's been menopausal for essentially 5 years. She never took any hormone replacement therapy. Due to the persistent nature for spotting she went to urgent care and his was referred to Dr. Dellis Filbert.  Dr. Dellis Filbert performed an endometrial biopsy on February 13 2013. 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  She also had a CT scan of the abdomen and pelvis. IMPRESSION:  1. Abnormal appearance of the uterus in this patient with recently diagnosed endometrial carcinoma. No evidence of metastatic involvement of the abdomen or pelvis and no adenopathy is seen.  2. 4 mm anterolisthesis of L4 on L5 which appears to be due to degenerative change   RTH BSO LND was planned for 03/2013.  HgbA1C was elevated and instead a  Mirena IUD was placed and the  patinet was advised to secure better endocrinologic management of her IDDM.    On August 31, 2013 she underwent a  robotic-assisted total laparoscopic hysterectomy bilateral salpingo-oophorectomy bilateral pelvic lymph node dissection. Pathology was notable for an invasive endometrioid grade 1 carcinoma invading 9 mm where the myometrium is 19 mm. Tumor size was 1.8 cm. There was no lymphovascular space involvement and 0 of 10 lymph nodes were involved. On evaluation of the distal adnexa the left distal fimbriated end of the fallopian tubes showed a low grade adenocarcinoma with superficial stromal invasion present.  The morphologic features of the fimbrial tumor were completely different from the endometrial tumor. Microsatellite instability was not appreciated in the endometrial tumor.   She received  6 cycles Taxol/Carbo completed 01/2014.    CT 03/11/2014 IMPRESSION:  1. No CT evidence of recurrent or metastatic disease identified within the abdomen and pelvis.  2. No acute intra-abdominal or pelvic process identified.  3. Small fat containing paraumbilical hernia.   Review of Systems   Denies vaginal bleeding, no abdominal pain, no n/v, no visual changes. 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  Denies falls.  No vaginal or rectal bleeding.  No SOB no chest pain.  Otherwise ROS negative  Past Surgical Hx:  Past Surgical History  Procedure Laterality Date  . Uterine fibroid surgery  20 YRS AGO  . Dilation and curettage of uterus N/A 03/26/2013    Procedure: DILATATION AND CURETTAGE with insertion of Mirena IUD;  Surgeon: Janie Morning, MD;  Location: [facility];  Service: Gynecology;  Laterality: N/A;  to start at 0745 per nancy. placement of Mirena.  . Robotic assisted total hysterectomy with  bilateral salpingo oopherectomy N/A 09/17/2013    Procedure: ROBOTIC ASSISTED TOTAL HYSTERECTOMY WITH BILATERAL SALPINGO OOPHORECTOMY;  Surgeon: Janie Morning, MD;  Location: [facility];  Service: Gynecology;  Laterality: N/A;  . Lymph node dissection N/A 09/17/2013    Procedure: LYMPH NODE DISECTION/STAGGING;   Surgeon: Janie Morning, MD;  Location: [facility];  Service: Gynecology;  Laterality: N/A;    Past Medical Hx:  Past Medical History  Diagnosis Date  . Postmenopausal bleeding   . Diabetes mellitus without complication   . Hyperlipidemia   . Cancer     endometrial  Left Fallopian tube cancer   Family Hx:  Family History   Problem  Relation  Age of Onset   .  Diabetes  Mother    .  Hypertension  Mother    .  Breast cancer  Mother    .  Diabetes  Father       Physical Exam:  Vitals BP 122/61  Pulse 89  Temp(Src) 98.4 F (36.9 C) (Oral)  Resp 18  Ht '5\' 3"'  (1.6 m)  Wt 163 lb 8 oz (74.163 kg)  BMI 28.97 kg/m2 Wt Readings from Last 3 Encounters:  10/15/14 169 lb 6.4 oz (76.839 kg)  07/10/14 163 lb 8 oz (74.163 kg)  04/14/14 160 lb 9.6 oz (72.848 kg)   WD female in NAD CHEST:  CTA CARDIAC: RRR ABD:  Soft NT port sites without erythema or hernia Pelvic:  Nl EGBUS, Cuff intact, no blood no cul de sac masses Rectal:  Good tone no masses Back:  No CVAT LN:  No cervical supraclavicular or inguinal adenopathy Ext:  No CCE

## 2015-01-29 ENCOUNTER — Ambulatory Visit: Payer: Self-pay | Attending: Gynecologic Oncology | Admitting: Gynecologic Oncology

## 2015-01-29 VITALS — BP 127/61 | HR 88 | Temp 98.2°F | Resp 18 | Ht 63.0 in | Wt 172.5 lb

## 2015-01-29 DIAGNOSIS — C5700 Malignant neoplasm of unspecified fallopian tube: Secondary | ICD-10-CM | POA: Insufficient documentation

## 2015-01-29 DIAGNOSIS — C5702 Malignant neoplasm of left fallopian tube: Secondary | ICD-10-CM

## 2015-01-29 DIAGNOSIS — Z8544 Personal history of malignant neoplasm of other female genital organs: Secondary | ICD-10-CM

## 2015-01-29 DIAGNOSIS — Z8542 Personal history of malignant neoplasm of other parts of uterus: Secondary | ICD-10-CM

## 2015-01-29 DIAGNOSIS — C541 Malignant neoplasm of endometrium: Secondary | ICD-10-CM | POA: Insufficient documentation

## 2015-01-29 NOTE — Progress Notes (Signed)
GYN ONCOLOGY OFFICE VISIT   CC:  Chief Complaint   Patient presents with   .  endometrial cancer  Stage IA  Fallopian tube cancer Unstaged  Assessment/Plan:  59 y.o. with a synchronous endometrial and  fallopian tube adenocarcinoma.  Stage IA grade 1 endometrial carcinoma   there is no lymphovascular space invasion there is less than 50% myometrial invasion.  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  This represents a very early fallopian tube cancer that was only detected on microscopic evaluation. As such omentectomy and periaortic nodes were not assessed at the time of surgery.  Received 6 cycles taxol/carboplatin completed 01/2014.  Counseled on the signs and symptoms of recurrence  Follow-up with Dr. Marko Plume as scheduled.  Follow-up with Gyn Oncology in 03/2015  Pap at next visit.  HPI:  Makayla Buchanan was initially  seen at the request of Dr. Dellis Filbert a few months ago  for a newly diagnosed endometrial cancer.  She's been menopausal for essentially 5 years. She never took any hormone replacement therapy. Due to the persistent nature for spotting she went to urgent care and his was referred to Dr. Dellis Filbert.  Dr. Dellis Filbert performed an endometrial biopsy on February 13 2013. 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  She also had a CT scan of the abdomen and pelvis. IMPRESSION:  1. Abnormal appearance of the uterus in this patient with recently diagnosed endometrial carcinoma. No evidence of metastatic involvement of the abdomen or pelvis and no adenopathy is seen.  2. 4 mm anterolisthesis of L4 on L5 which appears to be due to degenerative change   RTH BSO LND was planned for 03/2013.  HgbA1C was elevated and instead a  Mirena IUD was placed and the  patinet was advised to secure better endocrinologic management of her IDDM.    On August 31, 2013 she underwent a  robotic-assisted total laparoscopic hysterectomy bilateral salpingo-oophorectomy bilateral pelvic lymph node dissection. Pathology was notable for an invasive endometrioid grade 1 carcinoma invading 9 mm where the myometrium is 19 mm. Tumor size was 1.8 cm. There was no lymphovascular space involvement and 0 of 10 lymph nodes were involved. On evaluation of the distal adnexa the left distal fimbriated end of the fallopian tubes showed a low grade adenocarcinoma with superficial stromal invasion present.  The morphologic features of the fimbrial tumor were completely different from the endometrial tumor. Microsatellite instability was not appreciated in the endometrial tumor.   She received  6 cycles Taxol/Carbo completed 01/2014.    CT 03/11/2014 IMPRESSION:  1. No CT evidence of recurrent or metastatic disease identified within the abdomen and pelvis.  2. No acute intra-abdominal or pelvic process identified.  3. Small fat containing paraumbilical hernia.   Review of Systems   Denies vaginal bleeding, no abdominal pain, no n/v, no visual changes. 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  Denies falls.  No vaginal or rectal bleeding.  No SOB no chest pain.  Rudy Jew is not well controlled because of insurance challenges.Otherwise ROS negative  Past Surgical Hx:  Past Surgical History  Procedure Laterality Date  . Uterine fibroid surgery  20 YRS AGO  . Dilation and curettage of uterus N/A 03/26/2013    Procedure: DILATATION AND CURETTAGE with insertion of Mirena IUD;  Surgeon: Janie Morning, MD;  Location: [facility];  Service: Gynecology;  Laterality: N/A;  to start at 0745 per nancy. placement  of Mirena.  . Robotic assisted total hysterectomy with bilateral salpingo oopherectomy N/A 09/17/2013    Procedure: ROBOTIC ASSISTED TOTAL HYSTERECTOMY WITH BILATERAL SALPINGO OOPHORECTOMY;  Surgeon: Janie Morning, MD;  Location: [facility];  Service: Gynecology;  Laterality: N/A;  . Lymph node dissection  N/A 09/17/2013    Procedure: LYMPH NODE DISECTION/STAGGING;  Surgeon: Janie Morning, MD;  Location: [facility];  Service: Gynecology;  Laterality: N/A;    Past Medical Hx:  Past Medical History  Diagnosis Date  . Postmenopausal bleeding   . Diabetes mellitus without complication   . Hyperlipidemia   . Cancer     endometrial  Left Fallopian tube cancer dx 08/2013   Family Hx:  Family History   Problem  Relation  Age of Onset   .  Diabetes  Mother    .  Hypertension  Mother    .  Breast cancer  Mother    .  Diabetes  Father       Physical Exam:  Vitals BP 127/61 mmHg  Pulse 88  Temp(Src) 98.2 F (36.8 C) (Oral)  Resp 18  Ht '5\' 3"'  (1.6 m)  Wt 172 lb 8 oz (78.245 kg)  BMI 30.56 kg/m2  SpO2 100% Wt Readings from Last 3 Encounters:  01/29/15 172 lb 8 oz (78.245 kg)  10/15/14 169 lb 6.4 oz (76.839 kg)  07/10/14 163 lb 8 oz (74.163 kg)   WD female in NAD CHEST:  CTA CARDIAC: RRR ABD:  Soft NT port sites without erythema or hernia Pelvic:  Nl EGBUS, Cuff intact, no blood no cul de sac masses Rectal:  Good tone no masses Back:  No CVAT LN:  No cervical supraclavicular or inguinal adenopathy Ext:  No CCE

## 2015-02-05 ENCOUNTER — Ambulatory Visit: Payer: Self-pay | Admitting: Gynecologic Oncology

## 2015-03-19 ENCOUNTER — Other Ambulatory Visit: Payer: Self-pay | Admitting: Oncology

## 2015-03-19 DIAGNOSIS — C5702 Malignant neoplasm of left fallopian tube: Secondary | ICD-10-CM

## 2015-03-20 ENCOUNTER — Telehealth: Payer: Self-pay | Admitting: Oncology

## 2015-03-20 NOTE — Telephone Encounter (Signed)
Spoke with patient and she is aware of her appointments and A calendar has been mailed to patient

## 2015-04-30 ENCOUNTER — Other Ambulatory Visit (HOSPITAL_COMMUNITY)
Admission: RE | Admit: 2015-04-30 | Discharge: 2015-04-30 | Disposition: A | Discharge status: Home / Self Care | Payer: BLUE CROSS/BLUE SHIELD | Source: Ambulatory Visit | Attending: Gynecologic Oncology | Admitting: Gynecologic Oncology

## 2015-04-30 ENCOUNTER — Encounter: Payer: Self-pay | Admitting: Gynecologic Oncology

## 2015-04-30 ENCOUNTER — Ambulatory Visit: Payer: BLUE CROSS/BLUE SHIELD | Attending: Gynecologic Oncology | Admitting: Gynecologic Oncology

## 2015-04-30 ENCOUNTER — Other Ambulatory Visit (HOSPITAL_BASED_OUTPATIENT_CLINIC_OR_DEPARTMENT_OTHER): Payer: BLUE CROSS/BLUE SHIELD

## 2015-04-30 VITALS — BP 124/64 | HR 86 | Temp 98.2°F | Resp 18 | Ht 63.0 in | Wt 168.1 lb

## 2015-04-30 DIAGNOSIS — C541 Malignant neoplasm of endometrium: Secondary | ICD-10-CM

## 2015-04-30 DIAGNOSIS — Z1151 Encounter for screening for human papillomavirus (HPV): Secondary | ICD-10-CM | POA: Insufficient documentation

## 2015-04-30 DIAGNOSIS — Z90710 Acquired absence of both cervix and uterus: Secondary | ICD-10-CM

## 2015-04-30 DIAGNOSIS — C5702 Malignant neoplasm of left fallopian tube: Secondary | ICD-10-CM | POA: Diagnosis not present

## 2015-04-30 DIAGNOSIS — Z01411 Encounter for gynecological examination (general) (routine) with abnormal findings: Secondary | ICD-10-CM | POA: Diagnosis present

## 2015-04-30 DIAGNOSIS — Z9221 Personal history of antineoplastic chemotherapy: Secondary | ICD-10-CM | POA: Diagnosis not present

## 2015-04-30 DIAGNOSIS — Z8544 Personal history of malignant neoplasm of other female genital organs: Secondary | ICD-10-CM

## 2015-04-30 DIAGNOSIS — Z8542 Personal history of malignant neoplasm of other parts of uterus: Secondary | ICD-10-CM | POA: Diagnosis not present

## 2015-04-30 LAB — CBC WITH DIFFERENTIAL/PLATELET
BASO%: 0.1 % (ref 0.0–2.0)
Basophils Absolute: 0 10*3/uL (ref 0.0–0.1)
EOS ABS: 0.1 10*3/uL (ref 0.0–0.5)
EOS%: 2.1 % (ref 0.0–7.0)
HCT: 33.4 % — ABNORMAL LOW (ref 34.8–46.6)
HGB: 10.8 g/dL — ABNORMAL LOW (ref 11.6–15.9)
LYMPH%: 34.7 % (ref 14.0–49.7)
MCH: 29.2 pg (ref 25.1–34.0)
MCHC: 32.3 g/dL (ref 31.5–36.0)
MCV: 90.3 fL (ref 79.5–101.0)
MONO#: 0.4 10*3/uL (ref 0.1–0.9)
MONO%: 6.3 % (ref 0.0–14.0)
NEUT#: 3.8 10*3/uL (ref 1.5–6.5)
NEUT%: 56.8 % (ref 38.4–76.8)
PLATELETS: 199 10*3/uL (ref 145–400)
RBC: 3.7 10*6/uL (ref 3.70–5.45)
RDW: 13.9 % (ref 11.2–14.5)
WBC: 6.7 10*3/uL (ref 3.9–10.3)
lymph#: 2.3 10*3/uL (ref 0.9–3.3)

## 2015-04-30 LAB — COMPREHENSIVE METABOLIC PANEL (CC13)
ALBUMIN: 3.6 g/dL (ref 3.5–5.0)
ALT: 16 U/L (ref 0–55)
AST: 18 U/L (ref 5–34)
Alkaline Phosphatase: 99 U/L (ref 40–150)
Anion Gap: 13 mEq/L — ABNORMAL HIGH (ref 3–11)
BUN: 15 mg/dL (ref 7.0–26.0)
CHLORIDE: 109 meq/L (ref 98–109)
CO2: 24 meq/L (ref 22–29)
Calcium: 9 mg/dL (ref 8.4–10.4)
Creatinine: 1.1 mg/dL (ref 0.6–1.1)
EGFR: 65 mL/min/{1.73_m2} — ABNORMAL LOW (ref >90)
Glucose: 263 mg/dl — ABNORMAL HIGH (ref 70–140)
Potassium: 4.4 mEq/L (ref 3.5–5.1)
Sodium: 146 mEq/L — ABNORMAL HIGH (ref 136–145)
Total Bilirubin: <0.2 mg/dL (ref 0.20–1.20)
Total Protein: 7 g/dL (ref 6.4–8.3)

## 2015-04-30 NOTE — Patient Instructions (Addendum)
We will contact you with the results of your pap smear.  You will receive a phone call from the [facility] about arranging an appointment to see the genetics counselor.  Plan to follow up with Dr. Skeet Latch in six months.  Thank you for coming to see me today.  I appreciate your confidence in choosing [facility] for your medical care.  If you have any questions about your visit today, please call our office and we will get back to you as soon as possible.  Dr. Janie Morning Gynecologic Oncology

## 2015-04-30 NOTE — Progress Notes (Signed)
GYN ONCOLOGY OFFICE VISIT   CC:  Chief Complaint   Patient presents with   .  endometrial cancer  Stage IA  Fallopian tube cancer Unstaged  Assessment/Plan:  59 y.o. with a synchronous endometrial and  fallopian tube adenocarcinoma.  Stage IA grade 1 endometrial carcinoma   there is no lymphovascular space invasion there is less than 50% myometrial invasion.  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  This represents a very early fallopian tube cancer that was only detected on microscopic evaluation. As such omentectomy and periaortic nodes were not assessed at the time of surgery. Received 6 cycles taxol/carboplatin completed 01/2014. Pap collected today  Counseled on the signs and symptoms of recurrence  Follow-up with Dr. Marko Plume as scheduled. 07/2015  Follow-up with Gyn Oncology in 10/2015  Follow-up with Dr. Dellis Filbert 01/2016  Referral for genetic counseling with cascade testing as indicated   HPI:  Makayla Buchanan was initially  seen at the request of Dr. Dellis Filbert a few months ago  for a newly diagnosed endometrial cancer.  She's been menopausal for essentially 5 years. She never took any hormone replacement therapy. Due to the persistent nature for spotting she went to urgent care and his was referred to Dr. Dellis Filbert.  Dr. Dellis Filbert performed an endometrial biopsy on February 13 2013. 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  She also had a CT scan of the abdomen and pelvis. IMPRESSION:  1. Abnormal appearance of the uterus in this patient with recently diagnosed endometrial carcinoma. No evidence of metastatic involvement of the abdomen or pelvis and no adenopathy is seen.  2. 4 mm anterolisthesis of L4 on L5 which appears to be due to degenerative change   RTH BSO LND was planned for 03/2013.  HgbA1C was elevated and instead a  Mirena IUD was placed and the  patinet was advised  to secure better endocrinologic management of her IDDM.    On August 31, 2013 she underwent a robotic-assisted total laparoscopic hysterectomy bilateral salpingo-oophorectomy bilateral pelvic lymph node dissection. Pathology was notable for an invasive endometrioid grade 1 carcinoma invading 9 mm where the myometrium is 19 mm. Tumor size was 1.8 cm. There was no lymphovascular space involvement and 0 of 10 lymph nodes were involved. On evaluation of the distal adnexa the left distal fimbriated end of the fallopian tubes showed a low grade adenocarcinoma with superficial stromal invasion present.  The morphologic features of the fimbrial tumor were completely different from the endometrial tumor. Microsatellite instability was not appreciated in the endometrial tumor.   She received  6 cycles Taxol/Carbo completed 01/2014.   CT 03/11/2014 IMPRESSION:  1. No CT evidence of recurrent or metastatic disease identified within the abdomen and pelvis.  2. No acute intra-abdominal or pelvic process identified.  3. Small fat containing paraumbilical hernia.   Review of Systems   Denies vaginal bleeding, no abdominal pain, no n/v, no visual changes. 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  Denies falls.  No vaginal or rectal bleeding.  No SOB no chest pain.  Rudy Jew is not well controlled because of insurance challenges. (HbGA1C > 8) Otherwise 10 point ROS negative  Past Surgical Hx:  Past Surgical History  Procedure Laterality Date  . Uterine fibroid surgery  20 YRS AGO  . Dilation and curettage of uterus N/A 03/26/2013    Procedure: DILATATION AND CURETTAGE with insertion of Mirena IUD;  Surgeon: Abigail Butts  Skeet Latch, MD;  Location: [facility];  Service: Gynecology;  Laterality: N/A;  to start at 0745 per nancy. placement of Mirena.  . Robotic assisted total hysterectomy with bilateral salpingo oopherectomy N/A 09/17/2013    Procedure: ROBOTIC ASSISTED TOTAL HYSTERECTOMY WITH BILATERAL SALPINGO  OOPHORECTOMY;  Surgeon: Janie Morning, MD;  Location: [facility];  Service: Gynecology;  Laterality: N/A;  . Lymph node dissection N/A 09/17/2013    Procedure: LYMPH NODE DISECTION/STAGGING;  Surgeon: Janie Morning, MD;  Location: [facility];  Service: Gynecology;  Laterality: N/A;    Past Medical Hx:  Past Medical History  Diagnosis Date  . Postmenopausal bleeding   . Diabetes mellitus without complication   . Hyperlipidemia   . Cancer     endometrial  Left Fallopian tube cancer dx 08/2013   Family Hx:  Family History   Problem  Relation  Age of Onset   .  Diabetes  Mother    .  Hypertension  Mother    .  Breast cancer  Mother    .  Diabetes  Father       Physical Exam:  Vitals BP 124/64 mmHg  Pulse 86  Temp(Src) 98.2 F (36.8 C) (Oral)  Resp 18  Ht _0  (1.6 m)  Wt 168 lb 1.6 oz (76.25 kg)  BMI 29.79 kg/m2 Wt Readings from Last 3 Encounters:  04/30/15 168 lb 1.6 oz (76.25 kg)  01/29/15 172 lb 8 oz (78.245 kg)  10/15/14 169 lb 6.4 oz (76.839 kg)   WD female in NAD CHEST:  CTA CARDIAC: RRR ABD:  Soft NT port sites without erythema or hernia, no ascites Pelvic:  Nl EGBUS, Cuff intact, no blood no cul de sac masses, two 91m subserosal cysts palpated in the anterior vagina.  Not visible on speculum examination.  Pap collected. Rectal:  Good tone no masses Back:  No CVAT LN:  No cervical supraclavicular or inguinal adenopathy Ext:  No CCE

## 2015-05-01 LAB — CA 125: CA 125: 9 U/mL (ref <35)

## 2015-05-04 ENCOUNTER — Telehealth: Payer: Self-pay | Admitting: Genetic Counselor

## 2015-05-04 NOTE — Telephone Encounter (Signed)
genetic referral-s/w patient and gave appt for 06/15 @ 11 w/Karen Florene [place]

## 2015-05-05 ENCOUNTER — Telehealth: Payer: Self-pay

## 2015-05-05 LAB — CYTOLOGY - PAP

## 2015-05-05 NOTE — Telephone Encounter (Signed)
Told Makayla Buchanan that her CA-125 marker  was good at 9, the CBC was was good Hgb at 10.8 slightly lower then previous labs.  Her blood sugar was a high at 263, pt. Diabetic. Patient verbalized understanding.

## 2015-05-05 NOTE — Telephone Encounter (Signed)
-----   Message from Gordy Levan, MD sent at 03/19/2015  5:38 PM EDT ----- For CBC CMET CA 125 on 5-12 when she sees Dr Skeet Latch. Need to be sure to let her know the lab results, do not have to tell her same day.  thanks

## 2015-05-11 ENCOUNTER — Telehealth: Payer: Self-pay | Admitting: Nurse Practitioner

## 2015-05-11 NOTE — Telephone Encounter (Signed)
Calling to give PAP results per Joylene John, NP. No answer, left message to call back.

## 2015-05-11 NOTE — Telephone Encounter (Signed)
Per Joylene John, NP, patient informed PAP shows atypical/abnormal cells but not HPV. She verbalizes understanding and denies further questions. She is informed to call clinic with any questions or concerns.

## 2015-06-03 ENCOUNTER — Encounter: Payer: Self-pay | Admitting: Genetic Counselor

## 2015-06-03 ENCOUNTER — Ambulatory Visit (HOSPITAL_BASED_OUTPATIENT_CLINIC_OR_DEPARTMENT_OTHER): Payer: BLUE CROSS/BLUE SHIELD | Admitting: Genetic Counselor

## 2015-06-03 ENCOUNTER — Other Ambulatory Visit: Payer: BLUE CROSS/BLUE SHIELD

## 2015-06-03 DIAGNOSIS — C541 Malignant neoplasm of endometrium: Secondary | ICD-10-CM

## 2015-06-03 DIAGNOSIS — Z803 Family history of malignant neoplasm of breast: Secondary | ICD-10-CM | POA: Diagnosis not present

## 2015-06-03 DIAGNOSIS — C5702 Malignant neoplasm of left fallopian tube: Secondary | ICD-10-CM

## 2015-06-03 DIAGNOSIS — Z315 Encounter for genetic counseling: Secondary | ICD-10-CM | POA: Diagnosis not present

## 2015-06-03 DIAGNOSIS — C5700 Malignant neoplasm of unspecified fallopian tube: Secondary | ICD-10-CM

## 2015-06-03 NOTE — Progress Notes (Signed)
REFERRING PROVIDER: Bernerd Limbo, MD [place], [place] [address]   Janie Morning, MD  Evlyn Clines, MD  PRIMARY PROVIDER:  Phineas Inches, MD  PRIMARY REASON FOR VISIT:  1. Fallopian tube cancer, carcinoma, unspecified laterality   2. Endometrial cancer   3. Family history of breast cancer      HISTORY OF PRESENT ILLNESS:   Makayla Buchanan, a 59 y.o. female, was seen for a [place] cancer genetics consultation at the request of Dr. Coletta Memos due to a personal and family history of cancer.  Makayla Buchanan presents to clinic today to discuss the possibility of a hereditary predisposition to cancer, genetic testing, and to further clarify her future cancer risks, as well as potential cancer risks for family members.   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 This was treated with hysterectomy. MSI testing on the uterine cancer was stable and IHC was negative.   CANCER HISTORY:  Oncology History   Stage 1 grade 1 at [facility] and pelvic lymph node dissection 09-17-13, with <50% myometrial invasion and no lymphovascular space invasion     Endometrial cancer   03/07/2013 Initial Diagnosis Endometrial cancer     HORMONAL RISK FACTORS:  Menarche was at age 40-13.  First live birth at age 45.  OCP use for approximately 1 years.  Ovaries intact: no.  Hysterectomy: yes.  Menopausal status: postmenopausal.  HRT use: 0 years. Colonoscopy: no; not examined. Mammogram within the last year: no. Number of breast biopsies: 0. Up to date with pelvic exams:  yes. Any excessive radiation exposure in the past:  no  Past Medical History  Diagnosis Date  . Postmenopausal bleeding   . Diabetes mellitus without complication   . Hyperlipidemia   . Cancer     endometrial  . Family history of breast cancer     Past Surgical History  Procedure Laterality Date  . Uterine  fibroid surgery  20 YRS AGO  . Dilation and curettage of uterus N/A 03/26/2013    Procedure: DILATATION AND CURETTAGE with insertion of Mirena IUD;  Surgeon: Janie Morning, MD;  Location: [facility];  Service: Gynecology;  Laterality: N/A;  to start at 0745 per nancy. placement of Mirena.  . Robotic assisted total hysterectomy with bilateral salpingo oopherectomy N/A 09/17/2013    Procedure: ROBOTIC ASSISTED TOTAL HYSTERECTOMY WITH BILATERAL SALPINGO OOPHORECTOMY;  Surgeon: Janie Morning, MD;  Location: [facility];  Service: Gynecology;  Laterality: N/A;  . Lymph node dissection N/A 09/17/2013    Procedure: LYMPH NODE DISECTION/STAGGING;  Surgeon: Janie Morning, MD;  Location: [facility];  Service: Gynecology;  Laterality: N/A;    History   Social History  . Marital Status: Married    Spouse Name: N/A  . Number of Children: 1  . Years of Education: N/A   Occupational History  . DAYCARE     Social History Main Topics  . Smoking status: Never Smoker   . Smokeless tobacco: Never Used  . Alcohol Use: No  . Drug Use: No  . Sexual Activity: Yes   Other Topics Concern  . None   Social History Narrative     FAMILY HISTORY:  We obtained a detailed, 4-generation family history.  Significant diagnoses are listed below: Family History  Problem Relation Age of Onset  . Diabetes Mother   . Hypertension Mother   . Breast cancer Mother  dx in her 97s  . Diabetes Father   . Cancer Maternal Aunt     NOS   Makayla Buchanan has one daughter who is healthy.  She has one sister and three brothers who are all cancer free.  Her mother was diagnosed with breast cancer in her 28s, and is currently 71. Her mother had 7 sisters, one who had a cancer NOS.  Makayla Buchanan is not familiar with her maternal cousins.  Her father is alive at 90.  He had four sisters and a brother, none of whom had cancer but she is not knowledgeable about her cousins. Patient's ancestors are of African American descent. There is no reported  Ashkenazi Jewish ancestry. There is no known consanguinity.  GENETIC COUNSELING ASSESSMENT: Makayla Buchanan is a 59 y.o. female with a personal and family history of cancer which somewhat suggestive of a hereditary cancer syndrome and predisposition to cancer. We, therefore, discussed and recommended the following at today's visit.   DISCUSSION: We reviewed the characteristics, features and inheritance patterns of hereditary cancer syndromes. We discussed that the most common cause of fallopian tube cancer and family history of breast cancer is the result of BRCA mutations.  We also discussed that uterine cancer can at times be associated with BRCA mutations.  Lynch syndrome is also a cause of fallopian tube and uterine cancer.  Based on the family history of breast and ovarian cancer, as well as uterine cancer in the patient we reviewed hereditary cancer syndromes based on those cancers.  We also discussed genetic testing, including the appropriate family members to test, the process of testing, insurance coverage and turn-around-time for results. We discussed the implications of a negative, positive and/or variant of uncertain significant result. We recommended Makayla Buchanan pursue genetic testing for the custom gene panel. The custom gene panel offered by [facility] includes sequencing and rearrangement analysis for the following 23 genes:  ATM, BARD1, BRCA1, BRCA2, BRIP1, CDH1, CHEK2, EPCAM, FANCC, MLH1, MSH2, MSH6, MUTYH, NBN, PALB2, PMS2, POLD1, PTEN, RAD51C, RAD51D, STK11, TP53, and XRCC2.     Based on Makayla Buchanan personal and family history of cancer, she meets medical criteria for genetic testing. Despite that she meets criteria, she may still have an out of pocket cost. We discussed that if her out of pocket cost for testing is over $100, the laboratory will call and confirm whether she wants to proceed with testing.  If the out of pocket cost of testing is less than $100 she will be billed by the genetic  testing laboratory.   PLAN: After considering the risks, benefits, and limitations, Makayla Buchanan  provided informed consent to pursue genetic testing and the blood sample was sent to [facility] for analysis of the Custom gene panel. Results should be available within approximately 2-3 weeks' time, at which point they will be disclosed by telephone to Makayla Buchanan, as will any additional recommendations warranted by these results. Makayla Buchanan will receive a summary of her genetic counseling visit and a copy of her results once available. This information will also be available in Epic. We encouraged Makayla Buchanan to remain in contact with cancer genetics annually so that we can continuously update the family history and inform her of any changes in cancer genetics and testing that may be of benefit for her family. Makayla Buchanan questions were answered to her satisfaction today. Our contact information was provided should additional questions or concerns arise.  Lastly, we encouraged Ms. Schlachter to remain in contact  with cancer genetics annually so that we can continuously update the family history and inform her of any changes in cancer genetics and testing that may be of benefit for this family.   Ms.  Buchanan questions were answered to her satisfaction today. Our contact information was provided should additional questions or concerns arise. Thank you for the referral and allowing [place] to share in the care of your patient.   Makayla Buchanan P. Florene Glen, [place], [facility] Certified Genetic Counselor [email or web address] phone: [phone]  The patient was seen for a total of 60 minutes in face-to-face genetic counseling.  This patient was discussed with Drs. Magrinat, Lindi Adie and/or Burr Medico who agrees with the above.    _______________________________________________________________________ For Office Staff:  Number of people involved in session: 1 Was an Intern/ student involved with case: no

## 2015-06-12 ENCOUNTER — Encounter: Payer: Self-pay | Admitting: Genetic Counselor

## 2015-06-12 ENCOUNTER — Telehealth: Payer: Self-pay | Admitting: Genetic Counselor

## 2015-06-12 DIAGNOSIS — C5700 Malignant neoplasm of unspecified fallopian tube: Secondary | ICD-10-CM

## 2015-06-12 DIAGNOSIS — Z1379 Encounter for other screening for genetic and chromosomal anomalies: Secondary | ICD-10-CM | POA: Insufficient documentation

## 2015-06-12 DIAGNOSIS — C541 Malignant neoplasm of endometrium: Secondary | ICD-10-CM

## 2015-06-12 DIAGNOSIS — Z803 Family history of malignant neoplasm of breast: Secondary | ICD-10-CM

## 2015-06-12 NOTE — Telephone Encounter (Signed)
Revealed negative genetic testing.  Explained that she was negative for mutations iwthin 23 genes, and therefore her cancer appears to be more likely sporadic.  It was a difficult time for her to talk, so she will call back and get more information when it was convenient.

## 2015-06-12 NOTE — Progress Notes (Signed)
HPI: Ms. Bellotti was previously seen in the [place] clinic due to a personal history of cancer and concerns regarding a hereditary predisposition to cancer. Please refer to our prior cancer genetics clinic note for more information regarding Ms. Lute's medical, social and family histories, and our assessment and recommendations, at the time. Ms. Koy recent genetic test results were disclosed to her, as were recommendations warranted by these results. These results and recommendations are discussed in more detail below.  GENETIC TEST RESULTS: At the time of Ms. Flicker's visit, we recommended she pursue genetic testing of the Custom gene panel. The Custom gene panel offered by [facility] includes sequencing and rearrangement analysis for the following 23 genes:  ATM, BARD1, BRCA1, BRCA2, BRIP1, CDH1, CHEK2, EPCAM, FANCC, MLH1, MSH2, MSH6, MUTYH, NBN, PALB2, PMS2, PTEN, POLD1, RAD51C, RAD51D, STK11, TP53, and XRCC2.  The report date is June 11, 2015.  Genetic testing was normal, and did not reveal a deleterious mutation in these genes. The test report has been scanned into EPIC and is located under the Media tab.   We discussed with Ms. Sinyard that since the current genetic testing is not perfect, it is possible there may be a gene mutation in one of these genes that current testing cannot detect, but that chance is small. We also discussed, that it is possible that another gene that has not yet been discovered, or that we have not yet tested, is responsible for the cancer diagnoses in the family, and it is, therefore, important to remain in touch with cancer genetics in the future so that we can continue to offer Ms. Maxham the most up to date genetic testing.   CANCER SCREENING RECOMMENDATIONS:  This result is reassuring and indicates that Ms. Geerdes likely does not have an increased risk for a future cancer due to a mutation in one of these genes. This normal test also suggests that  Ms. Balthaser's cancer was most likely not due to an inherited predisposition associated with one of these genes.  Most cancers happen by chance and this negative test suggests that her cancer falls into this category.  We, therefore, recommended she continue to follow the cancer management and screening guidelines provided by her oncology and primary healthcare provider.   RECOMMENDATIONS FOR FAMILY MEMBERS: Women in this family might be at some increased risk of developing cancer, over the general population risk, simply due to the family history of cancer. We recommended women in this family have a yearly mammogram beginning at age 75, or 47 years younger than the earliest onset of cancer, an an annual clinical breast exam, and perform monthly breast self-exams. Women in this family should also have a gynecological exam as recommended by their primary provider. All family members should have a colonoscopy by age 38.  FOLLOW-UP: Lastly, we discussed with Ms. Saldarriaga that cancer genetics is a rapidly advancing field and it is possible that new genetic tests will be appropriate for her and/or her family members in the future. We encouraged her to remain in contact with cancer genetics on an annual basis so we can update her personal and family histories and let her know of advances in cancer genetics that may benefit this family.   Our contact number was provided. Ms. Gato questions were answered to her satisfaction, and she knows she is welcome to call us at anytime with additional questions or concerns.   Roma Kayser, MS, [facility] Certified Genetic Counselor [email or web address]

## 2015-07-30 ENCOUNTER — Ambulatory Visit: Payer: BLUE CROSS/BLUE SHIELD | Admitting: Gynecologic Oncology

## 2015-08-02 ENCOUNTER — Other Ambulatory Visit: Payer: Self-pay | Admitting: Oncology

## 2015-08-03 ENCOUNTER — Other Ambulatory Visit (HOSPITAL_BASED_OUTPATIENT_CLINIC_OR_DEPARTMENT_OTHER): Payer: BLUE CROSS/BLUE SHIELD

## 2015-08-03 ENCOUNTER — Encounter: Payer: Self-pay | Admitting: Oncology

## 2015-08-03 ENCOUNTER — Ambulatory Visit (HOSPITAL_BASED_OUTPATIENT_CLINIC_OR_DEPARTMENT_OTHER): Payer: BLUE CROSS/BLUE SHIELD | Admitting: Oncology

## 2015-08-03 ENCOUNTER — Telehealth: Payer: Self-pay | Admitting: Oncology

## 2015-08-03 VITALS — BP 123/65 | HR 80 | Temp 98.0°F | Resp 18 | Ht 63.0 in | Wt 172.4 lb

## 2015-08-03 DIAGNOSIS — G620 Drug-induced polyneuropathy: Secondary | ICD-10-CM | POA: Diagnosis not present

## 2015-08-03 DIAGNOSIS — C5702 Malignant neoplasm of left fallopian tube: Secondary | ICD-10-CM

## 2015-08-03 DIAGNOSIS — E1142 Type 2 diabetes mellitus with diabetic polyneuropathy: Secondary | ICD-10-CM

## 2015-08-03 DIAGNOSIS — Z1231 Encounter for screening mammogram for malignant neoplasm of breast: Secondary | ICD-10-CM

## 2015-08-03 DIAGNOSIS — T451X5A Adverse effect of antineoplastic and immunosuppressive drugs, initial encounter: Secondary | ICD-10-CM

## 2015-08-03 DIAGNOSIS — D6481 Anemia due to antineoplastic chemotherapy: Secondary | ICD-10-CM

## 2015-08-03 DIAGNOSIS — C541 Malignant neoplasm of endometrium: Secondary | ICD-10-CM | POA: Diagnosis not present

## 2015-08-03 LAB — COMPREHENSIVE METABOLIC PANEL (CC13)
ALT: 19 U/L (ref 0–55)
AST: 17 U/L (ref 5–34)
Albumin: 3.7 g/dL (ref 3.5–5.0)
Alkaline Phosphatase: 95 U/L (ref 40–150)
Anion Gap: 9 mEq/L (ref 3–11)
BUN: 19.3 mg/dL (ref 7.0–26.0)
CALCIUM: 9.6 mg/dL (ref 8.4–10.4)
CHLORIDE: 112 meq/L — AB (ref 98–109)
CO2: 24 meq/L (ref 22–29)
Creatinine: 1.1 mg/dL (ref 0.6–1.1)
EGFR: 65 mL/min/{1.73_m2} — ABNORMAL LOW (ref >90)
Glucose: 176 mg/dl — ABNORMAL HIGH (ref 70–140)
Potassium: 4.2 mEq/L (ref 3.5–5.1)
SODIUM: 145 meq/L (ref 136–145)
Total Bilirubin: 0.22 mg/dL (ref 0.20–1.20)
Total Protein: 7.2 g/dL (ref 6.4–8.3)

## 2015-08-03 LAB — CBC WITH DIFFERENTIAL/PLATELET
BASO%: 0.6 % (ref 0.0–2.0)
Basophils Absolute: 0.1 10*3/uL (ref 0.0–0.1)
EOS%: 0.8 % (ref 0.0–7.0)
Eosinophils Absolute: 0.1 10*3/uL (ref 0.0–0.5)
HEMATOCRIT: 33.3 % — AB (ref 34.8–46.6)
HGB: 11 g/dL — ABNORMAL LOW (ref 11.6–15.9)
LYMPH#: 2.7 10*3/uL (ref 0.9–3.3)
LYMPH%: 31.1 % (ref 14.0–49.7)
MCH: 29.6 pg (ref 25.1–34.0)
MCHC: 32.9 g/dL (ref 31.5–36.0)
MCV: 89.8 fL (ref 79.5–101.0)
MONO#: 0.6 10*3/uL (ref 0.1–0.9)
MONO%: 7 % (ref 0.0–14.0)
NEUT#: 5.2 10*3/uL (ref 1.5–6.5)
NEUT%: 60.5 % (ref 38.4–76.8)
Platelets: 227 10*3/uL (ref 145–400)
RBC: 3.7 10*6/uL (ref 3.70–5.45)
RDW: 13.8 % (ref 11.2–14.5)
WBC: 8.5 10*3/uL (ref 3.9–10.3)

## 2015-08-03 NOTE — Telephone Encounter (Signed)
Gave avs. Patient aware will call once schedule is available.

## 2015-08-03 NOTE — Progress Notes (Signed)
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 She completed 5 cycles of adjuvant taxol carboplatin 02-05-14 (cycle 6 omitted with cytopenias and peripheral neuropathy). Last imaging was CT [facility] 03-11-14. She saw Dr Skeet Latch in May and will see her again Nov 2016. Per Dr Skeet Latch, she should also see Dr Dellis Filbert 01-2016 (not set up). Patient sees PCP Dr Coletta Memos every 6 months. Metformin was stopped 2 weeks ago due to GI side effects.  She is overdue mammograms at [facility], last there 01-01-14. Patient agrees with me scheduling those now.  Other than residual peripheral neuropathy in feet, which is still bothersome, patient has no concerns now that seem referable to cancer or that treatment. She denies abdominal or pelvic pain, any bleeding, changes in bowels (back to normal since metformin stopped) or bladder, no LE swelling, no SOB. She uses gabapentin 300 mg at hs for peripheral neuropathy in feet.     No PAC No preop CA 125 Genetics testing normal 05-2015 (Custom Gene Panel 23)  ONCOLOGIC HISTORY Oncology History   Stage 1 grade 1 at [facility] and pelvic lymph node dissection 09-17-13, with <50% myometrial invasion and no lymphovascular space invasion     Endometrial cancer   03/07/2013 Initial Diagnosis Endometrial cancer  Patient presented to Dr Dellis Filbert with ~ 6 months of vaginal spotting. Endometrial biopsy in Feb 2014 found grade 1 endometrial carcinoma. At that time the patient had not seen physician for diabetes in 4 years and was not checking blood sugars at home, tho she did continue to take insulin (patient states that she "did not need prescription" for the insulin); lack of medical care due to no insurance at that time. CT AP in [place] system  02-26-2013 showed low attenuation in uterus but otherwise no findings suggesting metastatic disease and adnexa not remarkable. D and C was done and Mirena IUD placed by Dr Skeet Latch on 03-26-2013, with pathology [id number] endometrioid adenocarcinoma with squamous differentiation, grade 1. Patient established with Dr Coletta Memos and diabetes was controlled adequately to allow surgery by 09-17-13, this robotic [facility], BSO and bilateral pelvic node dissection by Dr Skeet Latch. 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 Post operative course was uncomplicated. Dr Skeet Latch recommended 6 cycles of taxol carboplatin particularly as omentectomy and periaortic nodes were not included in the surgical staging; dose dense regimen was chosen due to diabetic neuropathy and tolerance concerns. Cycle 1 day 1 was given 10-16-13. She was transfused PRBCs 12-18-13 for Hgb 7.6 and on 01-31-14 for hgb 7.8. Day 15 cycle 5 was 02-05-14, with chemotherapy ended then.  Review of systems as above, also: No fever or symptoms of infection. No noted changes in breasts.  Remainder of 10 point Review of Systems negative.  Objective:  Vital signs in last 24 hours:  BP 123/65 mmHg  Pulse 80  Temp(Src) 98 F (36.7 C) (Oral)  Resp 18  Ht '5\' 3"'  (1.6 m)  Wt 172 lb 6.4 oz (78.2 kg)  BMI 30.55 kg/m2  SpO2 100% Weight up  4 lbs Alert, oriented and appropriate. Ambulatory.  Normal hair pattern  HEENT:PERRL, sclerae not icteric. Oral mucosa moist without lesions, posterior pharynx clear.  Neck supple. No JVD.  Lymphatics:no cervical,supraclavicular, axillary or inguinal adenopathy Resp: clear to auscultation  bilaterally and normal percussion bilaterally Cardio: regular rate and rhythm. No gallop. GI: soft, nontender, not distended, no mass or organomegaly. Normally active bowel sounds. Surgical incision not remarkable. Musculoskeletal/ Extremities: without pitting edema, cords, tenderness Neuro: peripheral neuropathy feet bilaterally. Otherwise nonfocal. PSYCH appropriate mood and affect Skin without rash, ecchymosis, petechiae   Lab Results:  Results for orders placed or performed in visit on 08/03/15  CBC with Differential  Result Value Ref Range   WBC 8.5 3.9 - 10.3 10e3/uL   NEUT# 5.2 1.5 - 6.5 10e3/uL   HGB 11.0 (L) 11.6 - 15.9 g/dL   HCT 33.3 (L) 34.8 - 46.6 %   Platelets 227 145 - 400 10e3/uL   MCV 89.8 79.5 - 101.0 fL   MCH 29.6 25.1 - 34.0 pg   MCHC 32.9 31.5 - 36.0 g/dL   RBC 3.70 3.70 - 5.45 10e6/uL   RDW 13.8 11.2 - 14.5 %   lymph# 2.7 0.9 - 3.3 10e3/uL   MONO# 0.6 0.1 - 0.9 10e3/uL   Eosinophils Absolute 0.1 0.0 - 0.5 10e3/uL   Basophils Absolute 0.1 0.0 - 0.1 10e3/uL   NEUT% 60.5 38.4 - 76.8 %   LYMPH% 31.1 14.0 - 49.7 %   MONO% 7.0 0.0 - 14.0 %   EOS% 0.8 0.0 - 7.0 %   BASO% 0.6 0.0 - 2.0 %  Comprehensive metabolic panel (Cmet) - [facility]  Result Value Ref Range   Sodium 145 136 - 145 mEq/L   Potassium 4.2 3.5 - 5.1 mEq/L   Chloride 112 (H) 98 - 109 mEq/L   CO2 24 22 - 29 mEq/L   Glucose 176 (H) 70 - 140 mg/dl   BUN 19.3 7.0 - 26.0 mg/dL   Creatinine 1.1 0.6 - 1.1 mg/dL   Total Bilirubin 0.22 0.20 - 1.20 mg/dL   Alkaline Phosphatase 95 40 - 150 U/L   AST 17 5 - 34 U/L   ALT 19 0 - 55 U/L   Total Protein 7.2 6.4 - 8.3 g/dL   Albumin 3.7 3.5 - 5.0 g/dL   Calcium 9.6 8.4 - 10.4 mg/dL   Anion Gap 9 3 - 11 mEq/L   EGFR 65 (L) >90 ml/min/1.73 m2   CBC discussed, hemoglobin still slightly low  CA 125 on 04-30-15    9  Studies/Results:  No results found.  Medications: I have reviewed the patient's current medications.  DISCUSSION: Clinically doing well on  observation for synchronous endometrial and left fallopian cancers. She is aware of appointment with Dr Skeet Latch in Nov. Residual peripheral neuropathy in feet related to taxane and diabetes.  Mammograms as noted  With all other MD appointments, I will see her back in a year, or sooner if needed.  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 Follow up gyn oncology as noted and at this office 1 year 2.stage 1 grade 1 endometrial carcinoma: treated surgically, no adjuvant chemo specifically for this diagnosis, tho given for the fallopian ca. No RT recommended  3.diabetes x 20 years:  treated by Dr Coletta Memos. Waiting for preauth on substitute for metformin 4.diabetic retinal problems including bleed OD, treated by Dr Baird [facility]  5.preexisting peripheral neuropathy likely diabetic, with progression of peripheral neuropathy by end of cycle 5 taxol. Gabapentin 300 mg at hs is helpful with symptoms.  Unfortunately may not improve back to baseline 6.Chemo anemia improving, WBC recovered. Anemia now likely with chronic disease. Will recheck labs with my visit in 1 year unless done shortly prior to that by other MD 7.dental concerns: seen by [place], patient declined intervention 8. Diagnostic bilateral mammograms at [facility] 01-01-2014 with benign appearing calcifications, report scanned into this EMR. Patient agrees to let me schedule mammograms now at [facility], as she is overdue   All questions answered and patient is in agreement with recommendations and plans. Cc Dr Coletta Memos and Dr Dellis Filbert. Time spent 15 min including >50% counseling and coordination of care.   Tameeka Luo  P, MD   08/03/2015, 5:11 PM

## 2015-08-05 DIAGNOSIS — T451X5A Adverse effect of antineoplastic and immunosuppressive drugs, initial encounter: Secondary | ICD-10-CM

## 2015-08-05 DIAGNOSIS — D6481 Anemia due to antineoplastic chemotherapy: Secondary | ICD-10-CM | POA: Insufficient documentation

## 2015-08-05 DIAGNOSIS — G620 Drug-induced polyneuropathy: Secondary | ICD-10-CM | POA: Insufficient documentation

## 2015-08-05 DIAGNOSIS — Z1231 Encounter for screening mammogram for malignant neoplasm of breast: Secondary | ICD-10-CM | POA: Insufficient documentation

## 2015-10-29 ENCOUNTER — Ambulatory Visit: Payer: BLUE CROSS/BLUE SHIELD | Attending: Gynecologic Oncology | Admitting: Gynecologic Oncology

## 2015-10-29 ENCOUNTER — Encounter: Payer: Self-pay | Admitting: Gynecologic Oncology

## 2015-10-29 VITALS — BP 128/69 | HR 94 | Temp 98.1°F | Resp 18 | Ht 63.0 in | Wt 168.7 lb

## 2015-10-29 DIAGNOSIS — C5702 Malignant neoplasm of left fallopian tube: Secondary | ICD-10-CM | POA: Diagnosis present

## 2015-10-29 DIAGNOSIS — C541 Malignant neoplasm of endometrium: Secondary | ICD-10-CM | POA: Insufficient documentation

## 2015-10-29 NOTE — Progress Notes (Signed)
GYN ONCOLOGY OFFICE VISIT   CC:  Chief Complaint   Patient presents with   .  endometrial cancer  Stage IA  Fallopian tube cancer Unstaged  Assessment/Plan:  59 y.o. with a synchronous endometrial and  fallopian tube adenocarcinoma.  Stage IA grade 1 endometrial carcinoma   there is no lymphovascular space invasion there is less than 50% myometrial invasion.  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  This represents a very early fallopian tube cancer that was only detected on microscopic evaluation. As such omentectomy and periaortic nodes were not assessed at the time of surgery. Received 6 cycles taxol/carboplatin completed 01/2014.   Counseled on the signs and symptoms of recurrence  Follow-up with Gyn Oncology in 04/2016  Follow-up with Dr. Dellis Filbert 01/2016  Referral for genetic counseling with cascade testing as indicated   HPI:  Makayla Buchanan was initially  seen at the request of Dr. Dellis Filbert a few months ago  for a newly diagnosed endometrial cancer.  She's been menopausal for essentially 5 years. She never took any hormone replacement therapy. Due to the persistent nature for spotting she went to urgent care and his was referred to Dr. Dellis Filbert.  Dr. Dellis Filbert performed an endometrial biopsy on February 13 2013. 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  She also had a CT scan of the abdomen and pelvis. IMPRESSION:  1. Abnormal appearance of the uterus in this patient with recently diagnosed endometrial carcinoma. No evidence of metastatic involvement of the abdomen or pelvis and no adenopathy is seen.  2. 4 mm anterolisthesis of L4 on L5 which appears to be due to degenerative change   RTH BSO LND was planned for 03/2013.  HgbA1C was elevated and instead a  Mirena IUD was placed and the  patinet was advised to secure better endocrinologic management of her IDDM.    On  August 31, 2013 she underwent a robotic-assisted total laparoscopic hysterectomy bilateral salpingo-oophorectomy bilateral pelvic lymph node dissection. Pathology was notable for an invasive endometrioid grade 1 carcinoma invading 9 mm where the myometrium is 19 mm. Tumor size was 1.8 cm. There was no lymphovascular space involvement and 0 of 10 lymph nodes were involved. On evaluation of the distal adnexa the left distal fimbriated end of the fallopian tubes showed a low grade adenocarcinoma with superficial stromal invasion present.  The morphologic features of the fimbrial tumor were completely different from the endometrial tumor. Microsatellite instability was not appreciated in the endometrial tumor.   She received  6 cycles Taxol/Carbo completed 01/2014.   CT 03/11/2014 IMPRESSION:  1. No CT evidence of recurrent or metastatic disease identified within the abdomen and pelvis.  2. No acute intra-abdominal or pelvic process identified.  3. Small fat containing paraumbilical hernia.   Pap 04/2015 wnl  Review of Systems   Denies vaginal bleeding, no abdominal pain, no n/v, no visual changes. 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  Denies falls.  No vaginal or rectal bleeding.  No SOB no chest pain.  Reports weight gain.  Otherwise 10 point ROS negative  Past Surgical Hx:  Past Surgical History  Procedure Laterality Date  . Uterine fibroid surgery  20 YRS AGO  . Dilation and curettage of uterus N/A 03/26/2013    Procedure: DILATATION AND CURETTAGE with insertion of Mirena IUD;  Surgeon: Janie Morning, MD;  Location: [facility];  Service: Gynecology;  Laterality: N/A;  to  start at [facility] per nancy. placement of Mirena.  . Robotic assisted total hysterectomy with bilateral salpingo oopherectomy N/A 09/17/2013    Procedure: ROBOTIC ASSISTED TOTAL HYSTERECTOMY WITH BILATERAL SALPINGO OOPHORECTOMY;  Surgeon: Janie Morning, MD;  Location: [facility];  Service: Gynecology;  Laterality: N/A;  . Lymph  node dissection N/A 09/17/2013    Procedure: LYMPH NODE DISECTION/STAGGING;  Surgeon: Janie Morning, MD;  Location: [facility];  Service: Gynecology;  Laterality: N/A;    Past Medical Hx:  Past Medical History  Diagnosis Date  . Postmenopausal bleeding   . Diabetes mellitus without complication ([place])   . Hyperlipidemia   . Cancer [facility])     endometrial  . Family history of breast cancer   Left Fallopian tube cancer dx 08/2013   Family Hx:  Family History   Problem  Relation  Age of Onset   .  Diabetes  Mother    .  Hypertension  Mother    .  Breast cancer  Mother    .  Diabetes  Father      Social Hx:  Visiting her brother in [place] for Thanksgiving.  Physical Exam:  Vitals BP 128/69 mmHg  Pulse 94  Temp(Src) 98.1 F (36.7 C) (Oral)  Resp 18  Ht '5\' 3"'  (1.6 m)  Wt 168 lb 11.2 oz (76.522 kg)  BMI 29.89 kg/m2  SpO2 100% Wt Readings from Last 3 Encounters:  10/29/15 168 lb 11.2 oz (76.522 kg)  08/03/15 172 lb 6.4 oz (78.2 kg)  04/30/15 168 lb 1.6 oz (76.25 kg)   WD female in NAD CHEST:  CTA CARDIAC: RRR ABD:  Soft NT port sites without erythema or hernia, no ascites Pelvic:  Nl EGBUS, Cuff intact, no blood no cul de sac masses, two 57m subserosal cysts palpated in the anterior vagina, unchanged..  Not visible on speculum examination.  Rectal:  Good tone no masses Back:  No CVAT LN:  No cervical supraclavicular or inguinal adenopathy Ext:  No CCE

## 2015-10-29 NOTE — Patient Instructions (Signed)
Plan to see Dr. Pola Corn in Feb 2017 and Dr. Skeet Latch in May or sooner if needed.  Please call for any questions or concerns.  Thank you for coming to see me today.  I appreciate your confidence in choosing [facility] for your medical care.  If you have any questions about your visit today, please call our office and we will get back to you as soon as possible.  Dr. Janie Morning Gynecologic Oncology

## 2015-11-27 ENCOUNTER — Telehealth: Payer: Self-pay | Admitting: Oncology

## 2015-11-27 NOTE — Telephone Encounter (Signed)
Lvma dvising appt 08/08/16 @ 1.30pm. Also mailed calendar.

## 2016-04-20 NOTE — Progress Notes (Signed)
GYN ONCOLOGY OFFICE VISIT   CC: Fallopian tube cancer Unstaged, Stage IA grade 1 endometrial cancer, surveillance  Assessment/Plan:  60 y.o. with a synchronous endometrial and  fallopian tube adenocarcinoma.  Stage IA grade 1 endometrial carcinoma   there is no lymphovascular space invasion there is less than 50% myometrial invasion.  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  This represents a very early fallopian tube cancer that was only detected on microscopic evaluation. As such omentectomy and periaortic nodes were not assessed at the time of surgery. Received 6 cycles taxol/carboplatin completed 01/2014.   Counseled on the signs and symptoms of recurrence  Follow-up with Gyn Oncology in 10/2016  Follow-up with Dr. Dellis Filbert 01/2017    HPI:  Makayla Buchanan was initially  seen at the request of Dr. Dellis Filbert a few months ago  for a newly diagnosed endometrial cancer.  She's been menopausal for essentially 5 years. She never took any hormone replacement therapy. Due to the persistent nature for spotting she went to urgent care and his was referred to Dr. Dellis Filbert.  Dr. Dellis Filbert performed an endometrial biopsy on February 13 2013. 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  She also had a CT scan of the abdomen and pelvis. IMPRESSION:  1. Abnormal appearance of the uterus in this patient with recently diagnosed endometrial carcinoma. No evidence of metastatic involvement of the abdomen or pelvis and no adenopathy is seen.  2. 4 mm anterolisthesis of L4 on L5 which appears to be due to degenerative change   RTH BSO LND was planned for 03/2013.  HgbA1C was elevated and instead a  Mirena IUD was placed and the  patinet was advised to secure better endocrinologic management of her IDDM.    On August 31, 2013 she underwent a robotic-assisted total laparoscopic hysterectomy bilateral  salpingo-oophorectomy bilateral pelvic lymph node dissection. Pathology was notable for an invasive endometrioid grade 1 carcinoma invading 9 mm where the myometrium is 19 mm. Tumor size was 1.8 cm. There was no lymphovascular space involvement and 0 of 10 lymph nodes were involved. On evaluation of the distal adnexa the left distal fimbriated end of the fallopian tubes showed a low grade adenocarcinoma with superficial stromal invasion present.  The morphologic features of the fimbrial tumor were completely different from the endometrial tumor. Microsatellite instability was not appreciated in the endometrial tumor.   She received  6 cycles Taxol/Carbo completed 01/2014.   CT 03/11/2014 IMPRESSION:  1. No CT evidence of recurrent or metastatic disease identified within the abdomen and pelvis.  2. No acute intra-abdominal or pelvic process identified.  3. Small fat containing paraumbilical hernia.   Pap 04/2015 wnl  Review of Systems   Denies vaginal bleeding, no abdominal pain, no n/v, no visual changes. 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  Denies falls.  No vaginal or rectal bleeding.  No SOB no chest pain.  Reports weight gain.  Otherwise 10 point ROS negative  Past Surgical Hx:  Past Surgical History  Procedure Laterality Date  . Uterine fibroid surgery  20 YRS AGO  . Dilation and curettage of uterus N/A 03/26/2013    Procedure: DILATATION AND CURETTAGE with insertion of Mirena IUD;  Surgeon: Janie Morning, MD;  Location: [facility];  Service: Gynecology;  Laterality: N/A;  to start at 0745 per nancy. placement of Mirena.  . Robotic assisted total hysterectomy with bilateral salpingo oopherectomy N/A 09/17/2013  Procedure: ROBOTIC ASSISTED TOTAL HYSTERECTOMY WITH BILATERAL SALPINGO OOPHORECTOMY;  Surgeon: Janie Morning, MD;  Location: [facility];  Service: Gynecology;  Laterality: N/A;  . Lymph node dissection N/A 09/17/2013    Procedure: LYMPH NODE DISECTION/STAGGING;  Surgeon: Janie Morning, MD;  Location: [facility];  Service: Gynecology;  Laterality: N/A;    Past Medical Hx:  Past Medical History  Diagnosis Date  . Postmenopausal bleeding   . Diabetes mellitus without complication ([place])   . Hyperlipidemia   . Cancer [facility])     endometrial  . 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  Diabetes  Mother    .  Hypertension  Mother    .  Breast cancer  Mother    .  Diabetes  Father      Social Hx:  Visiting her brother in [place] for Thanksgiving.  Physical Exam:  Vitals BP 126/50 mmHg  Pulse 80  Temp(Src) 98.4 F (36.9 C) (Oral)  Resp 18  Ht '5\' 3"'  (1.6 m)  Wt 164 lb 6.4 oz (74.571 kg)  BMI 29.13 kg/m2  SpO2 99%  Wt Readings from Last 3 Encounters:  04/21/16 164 lb 6.4 oz (74.571 kg)  10/29/15 168 lb 11.2 oz (76.522 kg)  08/03/15 172 lb 6.4 oz (78.2 kg)   WD female in NAD CHEST:  CTA CARDIAC: RRR ABD:  Soft NT port sites without erythema or hernia, no ascites Pelvic:  Nl EGBUS, Cuff intact, no blood no cul de sac masses, two 83m subserosal cysts palpated in the anterior vagina, unchanged..  Not visible on speculum examination.  Rectal:  Good tone no masses Back:  No CVAT LN:  No cervical supraclavicular or inguinal adenopathy Ext:  No CCE

## 2016-04-21 ENCOUNTER — Ambulatory Visit: Payer: BLUE CROSS/BLUE SHIELD | Attending: Gynecologic Oncology | Admitting: Gynecologic Oncology

## 2016-04-21 ENCOUNTER — Encounter: Payer: Self-pay | Admitting: Gynecologic Oncology

## 2016-04-21 VITALS — BP 126/50 | HR 80 | Temp 98.4°F | Resp 18 | Ht 63.0 in | Wt 164.4 lb

## 2016-04-21 DIAGNOSIS — G620 Drug-induced polyneuropathy: Secondary | ICD-10-CM

## 2016-04-21 DIAGNOSIS — K429 Umbilical hernia without obstruction or gangrene: Secondary | ICD-10-CM | POA: Diagnosis not present

## 2016-04-21 DIAGNOSIS — C541 Malignant neoplasm of endometrium: Secondary | ICD-10-CM

## 2016-04-21 DIAGNOSIS — Z8542 Personal history of malignant neoplasm of other parts of uterus: Secondary | ICD-10-CM

## 2016-04-21 DIAGNOSIS — Z9889 Other specified postprocedural states: Secondary | ICD-10-CM | POA: Insufficient documentation

## 2016-04-21 DIAGNOSIS — C5702 Malignant neoplasm of left fallopian tube: Secondary | ICD-10-CM | POA: Insufficient documentation

## 2016-04-21 DIAGNOSIS — Z794 Long term (current) use of insulin: Secondary | ICD-10-CM | POA: Insufficient documentation

## 2016-04-21 DIAGNOSIS — G629 Polyneuropathy, unspecified: Secondary | ICD-10-CM | POA: Diagnosis not present

## 2016-04-21 DIAGNOSIS — Z8544 Personal history of malignant neoplasm of other female genital organs: Secondary | ICD-10-CM

## 2016-04-21 DIAGNOSIS — E785 Hyperlipidemia, unspecified: Secondary | ICD-10-CM | POA: Diagnosis not present

## 2016-04-21 DIAGNOSIS — T451X5A Adverse effect of antineoplastic and immunosuppressive drugs, initial encounter: Secondary | ICD-10-CM

## 2016-04-21 NOTE — Patient Instructions (Signed)
Plan to follow up in August or sooner if needed.

## 2016-08-05 ENCOUNTER — Other Ambulatory Visit: Payer: Self-pay

## 2016-08-05 DIAGNOSIS — C541 Malignant neoplasm of endometrium: Secondary | ICD-10-CM

## 2016-08-07 ENCOUNTER — Other Ambulatory Visit: Payer: Self-pay | Admitting: Oncology

## 2016-08-07 DIAGNOSIS — C5702 Malignant neoplasm of left fallopian tube: Secondary | ICD-10-CM

## 2016-08-08 ENCOUNTER — Ambulatory Visit (HOSPITAL_BASED_OUTPATIENT_CLINIC_OR_DEPARTMENT_OTHER): Payer: BLUE CROSS/BLUE SHIELD | Admitting: Oncology

## 2016-08-08 ENCOUNTER — Other Ambulatory Visit (HOSPITAL_BASED_OUTPATIENT_CLINIC_OR_DEPARTMENT_OTHER): Payer: BLUE CROSS/BLUE SHIELD

## 2016-08-08 ENCOUNTER — Encounter: Payer: Self-pay | Admitting: Oncology

## 2016-08-08 VITALS — BP 138/65 | HR 81 | Temp 98.3°F | Resp 17 | Ht 63.0 in | Wt 171.2 lb

## 2016-08-08 DIAGNOSIS — C541 Malignant neoplasm of endometrium: Secondary | ICD-10-CM

## 2016-08-08 DIAGNOSIS — E119 Type 2 diabetes mellitus without complications: Secondary | ICD-10-CM

## 2016-08-08 DIAGNOSIS — C5702 Malignant neoplasm of left fallopian tube: Secondary | ICD-10-CM | POA: Diagnosis not present

## 2016-08-08 DIAGNOSIS — G620 Drug-induced polyneuropathy: Secondary | ICD-10-CM | POA: Diagnosis not present

## 2016-08-08 DIAGNOSIS — D649 Anemia, unspecified: Secondary | ICD-10-CM | POA: Diagnosis not present

## 2016-08-08 DIAGNOSIS — T451X5A Adverse effect of antineoplastic and immunosuppressive drugs, initial encounter: Secondary | ICD-10-CM

## 2016-08-08 LAB — CBC WITH DIFFERENTIAL/PLATELET
BASO%: 0.4 % (ref 0.0–2.0)
BASOS ABS: 0 10*3/uL (ref 0.0–0.1)
EOS ABS: 0.1 10*3/uL (ref 0.0–0.5)
EOS%: 1.8 % (ref 0.0–7.0)
HEMATOCRIT: 35.4 % (ref 34.8–46.6)
HGB: 11.3 g/dL — ABNORMAL LOW (ref 11.6–15.9)
LYMPH%: 36.7 % (ref 14.0–49.7)
MCH: 28.2 pg (ref 25.1–34.0)
MCHC: 32 g/dL (ref 31.5–36.0)
MCV: 88.1 fL (ref 79.5–101.0)
MONO#: 0.5 10*3/uL (ref 0.1–0.9)
MONO%: 8 % (ref 0.0–14.0)
NEUT#: 3.5 10*3/uL (ref 1.5–6.5)
NEUT%: 53.1 % (ref 38.4–76.8)
PLATELETS: 217 10*3/uL (ref 145–400)
RBC: 4.02 10*6/uL (ref 3.70–5.45)
RDW: 14.3 % (ref 11.2–14.5)
WBC: 6.7 10*3/uL (ref 3.9–10.3)
lymph#: 2.4 10*3/uL (ref 0.9–3.3)

## 2016-08-08 LAB — COMPREHENSIVE METABOLIC PANEL
ALT: 34 U/L (ref 0–55)
ANION GAP: 9 meq/L (ref 3–11)
AST: 15 U/L (ref 5–34)
Albumin: 3.4 g/dL — ABNORMAL LOW (ref 3.5–5.0)
Alkaline Phosphatase: 119 U/L (ref 40–150)
BUN: 22.1 mg/dL (ref 7.0–26.0)
CALCIUM: 9.7 mg/dL (ref 8.4–10.4)
CHLORIDE: 110 meq/L — AB (ref 98–109)
CO2: 24 meq/L (ref 22–29)
Creatinine: 1.3 mg/dL — ABNORMAL HIGH (ref 0.6–1.1)
EGFR: 51 mL/min/{1.73_m2} — AB (ref >90)
Glucose: 224 mg/dl — ABNORMAL HIGH (ref 70–140)
Potassium: 4.5 mEq/L (ref 3.5–5.1)
Sodium: 143 mEq/L (ref 136–145)
TOTAL PROTEIN: 7.8 g/dL (ref 6.4–8.3)

## 2016-08-08 NOTE — Progress Notes (Signed)
OFFICE PROGRESS NOTE   August 08, 2016   Physicians:Wendy Pearcy, ML [place], D.Lucianne Lei, K.Heckler  INTERVAL HISTORY:   Patient is seen, alone for visit, in follow up of synchronous IA grade 1 endometrial carcinoma and IA low grade unstaged left fallopian carcinoma. She has been on observation since adjuvant carboplatin taxol chemotherapy completed with cycle 5 on 02-05-14 (cycle 6 omitted with peripheral neuropathy and cytopenias). Last imaging was CT AP 3-224-15. She saw Dr Skeet Latch 10-2015 and will see her again in 10-2016.  Patient sees PCP Dr Coletta Memos every 6 weeks for diabetes management.  Patient has peripheral neuropathy in feet since the chemotherapy, "like sandpaper on my toes", without other residual problems from that treatment. She denies abdominal or pelvic discomfort, bowels unchanged with constipation requiring regular senokot S, no bleeding, no LE swelling, no SOB or other respiratory symptoms. She is on insulin, reports last A1c 8.2. She has had no recent fever or symptoms of infection.  Remainder of 10 point Review of Systems negative  No PAC No preop CA 125 Genetics testing normal 05-2015 (Custom Gene Panel 23)     ONCOLOGIC HISTORY  Stage 1 grade 1 at [facility] BSO and pelvic lymph node dissection 09-17-13, with <50% myometrial invasion and no lymphovascular space invasion    Endometrial cancer   03/07/2013 Initial Diagnosis Endometrial cancer  Patient presented to Dr Dellis Filbert with ~ 6 months of vaginal spotting. Endometrial biopsy in Feb 2014 found grade 1 endometrial carcinoma. At that time the patient had not seen physician for diabetes in 4 years and was not checking blood sugars at home, tho she did continue to take insulin (patient states that she "did not need prescription" for the insulin); lack of medical care due to no insurance at that time. CT AP in [place] system 02-26-2013 showed low attenuation in uterus but otherwise no findings suggesting  metastatic disease and adnexa not remarkable. D and C was done and Mirena IUD placed by Dr Skeet Latch on 03-26-2013, with pathology [phone] endometrioid adenocarcinoma with squamous differentiation, grade 1. Patient established with Dr Coletta Memos and diabetes was controlled adequately to allow surgery by 09-17-13, this robotic [facility], BSO and bilateral pelvic node dissection by Dr Skeet Latch. Final pathology showed stage 1 grade 1 endometrial carcinoma with no lymphovascular space inasion and <50% myometrial invasion, but also unexpectedly found superficially invasive left fallopian tube adenocarcinoma 0.6cm, with no angiolymphatic invasion and total of 10 bilateral pelvic nodes negative. Recommendation was 6 cycles of taxol carboplatin particularly as omentectomy and periaortic nodes were not included in the surgical staging; dose dense regimen was chosen due to diabetic neuropathy and tolerance concerns. Cycle 1 day 1 was given 10-16-13. She was transfused PRBCs 12-18-13 for Hgb 7.6 and on 01-31-14 for hgb 7.8. Day 15 cycle 5 was 02-05-14, with chemotherapy discontinued then due to peripheral neuropathy and cytopenias.       Objective:  Vital signs in last 24 hours:  BP 138/65 (BP Location: Right Arm, Patient Position: Sitting)   Pulse 81   Temp 98.3 F (36.8 C) (Oral)   Resp 17   Ht '5\' 3"'  (1.6 m)   Wt 171 lb 3.2 oz (77.7 kg)   SpO2 99%   BMI 30.33 kg/m  Weight down 1 lb. Looks comfortable, respirations not labored. Alert, oriented and appropriate, very pleasant as always. Ambulatory without difficulty, wearing sandals.    HEENT:PERRL, sclerae not icteric. Oral mucosa moist without lesions, posterior pharynx clear.  Neck supple. No JVD.  Lymphatics:no cervical,supraclavicular  or inguinal adenopathy Resp: clear to auscultation bilaterally and normal percussion bilaterally Cardio: regular rate and rhythm. No gallop. GI: soft, nontender, not distended, no mass or organomegaly. Normally active bowel  sounds.  Musculoskeletal/ Extremities: LE without pitting edema, cords, tenderness Neuro:  peripheral neuropathy distal feet and toes bilaterally. Otherwise nonfocal. PSYCH appropriate mood and affect Skin without rash, ecchymosis, petechiae   Lab Results:  Results for orders placed or performed in visit on 08/08/16  CBC with Differential  Result Value Ref Range   WBC 6.7 3.9 - 10.3 10e3/uL   NEUT# 3.5 1.5 - 6.5 10e3/uL   HGB 11.3 (L) 11.6 - 15.9 g/dL   HCT 35.4 34.8 - 46.6 %   Platelets 217 145 - 400 10e3/uL   MCV 88.1 79.5 - 101.0 fL   MCH 28.2 25.1 - 34.0 pg   MCHC 32.0 31.5 - 36.0 g/dL   RBC 4.02 3.70 - 5.45 10e6/uL   RDW 14.3 11.2 - 14.5 %   lymph# 2.4 0.9 - 3.3 10e3/uL   MONO# 0.5 0.1 - 0.9 10e3/uL   Eosinophils Absolute 0.1 0.0 - 0.5 10e3/uL   Basophils Absolute 0.0 0.0 - 0.1 10e3/uL   NEUT% 53.1 38.4 - 76.8 %   LYMPH% 36.7 14.0 - 49.7 %   MONO% 8.0 0.0 - 14.0 %   EOS% 1.8 0.0 - 7.0 %   BASO% 0.4 0.0 - 2.0 %  Comprehensive metabolic panel  Result Value Ref Range   Sodium 143 136 - 145 mEq/L   Potassium 4.5 3.5 - 5.1 mEq/L   Chloride 110 (H) 98 - 109 mEq/L   CO2 24 22 - 29 mEq/L   Glucose 224 (H) 70 - 140 mg/dl   BUN 22.1 7.0 - 26.0 mg/dL   Creatinine 1.3 (H) 0.6 - 1.1 mg/dL   Total Bilirubin <0.30 0.20 - 1.20 mg/dL   Alkaline Phosphatase 119 40 - 150 U/L   AST 15 5 - 34 U/L   ALT 34 0 - 55 U/L   Total Protein 7.8 6.4 - 8.3 g/dL   Albumin 3.4 (L) 3.5 - 5.0 g/dL   Calcium 9.7 8.4 - 10.4 mg/dL   Anion Gap 9 3 - 11 mEq/L   EGFR 51 (L) >90 ml/min/1.73 m2   blood sugar above nonfasting  Available after visit  CA 125 stable by prior lab method at 9, which compares with 14 by new lab method  Studies/Results:  Mammograms done at [facility] 04-14-16, no mammographic findings of concern.   Medications: I have reviewed the patient's current medications. Continue gabapentin for peripheral neuropathy  DISCUSSION  Clinically doing well without obvious concerns  for recurrent gyn cancer. Peripheral neuropathy related to taxane and diabetes stable.  With multiple physicians involved, will be glad to see her back if we can be of help at this office, but have not set up a scheduled visit. She will see Dr Skeet Latch in late Nov as scheduled.   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 Follow up gyn oncology as noted and at this office prn 2.stage 1 grade 1 endometrial carcinoma: treated surgically, no adjuvant chemo specifically for this diagnosis, tho given for the fallopian ca. No RT recommended  3.diabetes x 20 years: treated by Dr Coletta Memos,  continues difficult to control 4.diabetic retinal problems including bleed OD previously, treated by Dr Baird Cancer  5.preexisting peripheral neuropathy likely diabetic, with progression of peripheral neuropathy by end of cycle 5 taxol. Gabapentin 300 mg at hs is helpful with symptoms.   6.multifactorial anemia stable 7.dental concerns: seen by [place] previously, patient declined intervention 8. Diagnostic bilateral mammograms done at [facility] 04-14-16, in [facility]    All questions answered and patient has been given Dr Leone Brand appointment 11-17-16. Cc Dr Coletta Memos. Time spent 20 min including >50% counseling and coordination of care.   Makayla Clines, Makayla Buchanan   08/08/2016, 9:59 PM

## 2016-08-09 ENCOUNTER — Telehealth: Payer: Self-pay

## 2016-08-09 LAB — CA 125: CANCER ANTIGEN (CA) 125: 14 U/mL (ref 0.0–38.1)

## 2016-08-09 LAB — CANCER ANTIGEN 125 (PARALLEL TESTING): CA 125: 9 U/mL (ref <35)

## 2016-08-09 NOTE — Telephone Encounter (Signed)
-----   Message from Gordy Levan, MD sent at 08/09/2016 11:20 AM EDT ----- Labs seen and need follow up  Can let her know CA 125 marker is stable, 9 by prior lab method which is same as in 04-2015, and which corresponds to 14 by new lab method. Keep appointments as planned

## 2016-08-09 NOTE — Telephone Encounter (Signed)
Left VM message stating the results of the CA-125 results as noted below by Dr. Marko Plume. Makayla Buchanan can call the office tomorrow if she has any questions or concerns.

## 2016-08-17 ENCOUNTER — Other Ambulatory Visit: Payer: Self-pay | Admitting: Orthopedic Surgery

## 2016-08-17 DIAGNOSIS — M79604 Pain in right leg: Secondary | ICD-10-CM

## 2016-08-17 DIAGNOSIS — M79605 Pain in left leg: Principal | ICD-10-CM

## 2016-08-18 ENCOUNTER — Ambulatory Visit: Payer: BLUE CROSS/BLUE SHIELD | Admitting: Gynecologic Oncology

## 2016-08-23 ENCOUNTER — Ambulatory Visit
Admission: RE | Admit: 2016-08-23 | Discharge: 2016-08-23 | Disposition: A | Discharge status: Home / Self Care | Payer: BLUE CROSS/BLUE SHIELD | Source: Ambulatory Visit | Attending: Orthopedic Surgery | Admitting: Orthopedic Surgery

## 2016-08-23 DIAGNOSIS — M79604 Pain in right leg: Secondary | ICD-10-CM

## 2016-08-23 DIAGNOSIS — M79605 Pain in left leg: Principal | ICD-10-CM

## 2016-11-15 ENCOUNTER — Other Ambulatory Visit (HOSPITAL_COMMUNITY)
Admission: RE | Admit: 2016-11-15 | Discharge: 2016-11-15 | Disposition: A | Discharge status: Home / Self Care | Payer: Self-pay | Source: Ambulatory Visit | Attending: Gynecologic Oncology | Admitting: Gynecologic Oncology

## 2016-11-15 DIAGNOSIS — Z01411 Encounter for gynecological examination (general) (routine) with abnormal findings: Secondary | ICD-10-CM | POA: Insufficient documentation

## 2016-11-15 NOTE — Progress Notes (Signed)
GYN ONCOLOGY OFFICE VISIT   CC: Fallopian tube cancer Unstaged, Stage IA grade 1 endometrial cancer, surveillance  Assessment/Plan:  60 y.o. with a synchronous endometrial and  fallopian tube adenocarcinoma.  Stage IA grade 1 endometrial carcinoma   there is no lymphovascular space invasion there is less than 50% myometrial invasion.  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  This represents a very early fallopian tube cancer that was only detected on microscopic evaluation. As such omentectomy and periaortic nodes were not assessed at the time of surgery. Received 6 cycles taxol/carboplatin completed 01/2014.   Counseled on the signs and symptoms of recurrence Pap collected. Follow-up with Gyn Oncology  04/2017     HPI:  Makayla Buchanan was initially  seen at the request of Dr. Dellis Filbert a few months ago  for a newly diagnosed endometrial cancer.  She's been menopausal for essentially 5 years. She never took any hormone replacement therapy. Due to the persistent nature for spotting she went to urgent care and his was referred to Dr. Dellis Filbert.  Dr. Dellis Filbert performed an endometrial biopsy on February 13 2013. 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  She also had a CT scan of the abdomen and pelvis. IMPRESSION:  1. Abnormal appearance of the uterus in this patient with recently diagnosed endometrial carcinoma. No evidence of metastatic involvement of the abdomen or pelvis and no adenopathy is seen.  2. 4 mm anterolisthesis of L4 on L5 which appears to be due to degenerative change   RTH BSO LND was planned for 03/2013.  HgbA1C was elevated and instead a  Mirena IUD was placed and the  patinet was advised to secure better endocrinologic management of her IDDM.    On August 31, 2013 she underwent a robotic-assisted total laparoscopic hysterectomy bilateral salpingo-oophorectomy bilateral  pelvic lymph node dissection. Pathology was notable for an invasive endometrioid grade 1 carcinoma invading 9 mm where the myometrium is 19 mm. Tumor size was 1.8 cm. There was no lymphovascular space involvement and 0 of 10 lymph nodes were involved. On evaluation of the distal adnexa the left distal fimbriated end of the fallopian tubes showed a low grade adenocarcinoma with superficial stromal invasion present.  The morphologic features of the fimbrial tumor were completely different from the endometrial tumor. Microsatellite instability was not appreciated in the endometrial tumor.   She received  6 cycles Taxol/Carbo completed 01/2014.   CT 03/11/2014  Small fat containing paraumbilical hernia.   Pap 04/2015 wnl Interval hx: No interval change. Feels well. States her diabetes is better controlled but is unable to provide a hemoglobin A1c.  Good appetite reports weight gain   Review of Systems   Denies vaginal bleeding, no abdominal pain, no n/v, no visual changes. 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  Denies falls.  No vaginal or rectal bleeding.  No SOB no chest pain.  Reports weight gain.  Otherwise 10 point ROS negative  Past Surgical Hx:  Past Surgical History:  Procedure Laterality Date  . DILATION AND CURETTAGE OF UTERUS N/A 03/26/2013   Procedure: DILATATION AND CURETTAGE with insertion of Mirena IUD;  Surgeon: Janie Morning, MD;  [facility];  Service: Gynecology;  Laterality: N/A;  to start at 0745 per nancy. placement of Mirena.  Marland Kitchen LYMPH NODE DISSECTION N/A 09/17/2013   Procedure: LYMPH NODE DISECTION/STAGGING;  Surgeon: Janie Morning, MD;  [facility];  Service: Gynecology;  Laterality: N/A;  . ROBOTIC ASSISTED TOTAL HYSTERECTOMY WITH BILATERAL SALPINGO OOPHERECTOMY N/A 09/17/2013   Procedure: ROBOTIC ASSISTED TOTAL HYSTERECTOMY WITH BILATERAL SALPINGO OOPHORECTOMY;  Surgeon: Janie Morning, MD;  [facility];  Service: Gynecology;  Laterality: N/A;  .  UTERINE FIBROID SURGERY  20 YRS AGO    Past Medical Hx:  Past Medical History:  Diagnosis Date  . Cancer ([place])    endometrial  . Diabetes mellitus without complication ([place])   . Family history of breast cancer   . Hyperlipidemia   . Postmenopausal bleeding   Left Fallopian tube cancer dx 08/2013  Genetic testing included ATM BRCA1 and BRCA2 CHEK2 MLH1  MSH1 to [facility] 6 PTEN etc wnl Family Hx:  Family History   Problem  Relation  Age of Onset   .  Diabetes  Mother    .  Hypertension  Mother    .  Breast cancer  Mother    .  Diabetes  Father      Social Hx:  Visiting [place] for Christmas  Physical Exam:  Vitals BP 126/69 (BP Location: Left Arm, Patient Position: Sitting)   Pulse 83   Temp 98 F (36.7 C) (Oral)   Resp 18   Ht '5\' 3"'  (1.6 m)   Wt 167 lb 8 oz (76 kg)   SpO2 99%   BMI 29.67 kg/m   Wt Readings from Last 3 Encounters:  11/17/16 167 lb 8 oz (76 kg)  08/08/16 171 lb 3.2 oz (77.7 kg)  04/21/16 164 lb 6.4 oz (74.6 kg)   WD female in NAD CHEST:  CTA CARDIAC: RRR ABD:  Soft NT port sites without erythema or hernia, no ascites Pelvic:  Nl EGBUS, Cuff intact, no blood no cul de sac masses, two 55m subserosal cysts palpated in the anterior vagina, unchanged..  Not visible on speculum examination. Pap collected  Rectal:  Good tone no masses Back:  No CVAT LN:  No cervical supraclavicular or inguinal adenopathy Ext:  No CCE

## 2016-11-17 ENCOUNTER — Encounter: Payer: Self-pay | Admitting: Gynecologic Oncology

## 2016-11-17 ENCOUNTER — Ambulatory Visit: Payer: Self-pay | Attending: Gynecologic Oncology | Admitting: Gynecologic Oncology

## 2016-11-17 VITALS — BP 126/69 | HR 83 | Temp 98.0°F | Resp 18 | Ht 63.0 in | Wt 167.5 lb

## 2016-11-17 DIAGNOSIS — C5700 Malignant neoplasm of unspecified fallopian tube: Secondary | ICD-10-CM

## 2016-11-17 DIAGNOSIS — C541 Malignant neoplasm of endometrium: Secondary | ICD-10-CM | POA: Insufficient documentation

## 2016-11-17 DIAGNOSIS — Z803 Family history of malignant neoplasm of breast: Secondary | ICD-10-CM | POA: Insufficient documentation

## 2016-11-17 DIAGNOSIS — Z9889 Other specified postprocedural states: Secondary | ICD-10-CM | POA: Insufficient documentation

## 2016-11-17 DIAGNOSIS — Z794 Long term (current) use of insulin: Secondary | ICD-10-CM | POA: Insufficient documentation

## 2016-11-17 DIAGNOSIS — Z90710 Acquired absence of both cervix and uterus: Secondary | ICD-10-CM | POA: Insufficient documentation

## 2016-11-17 DIAGNOSIS — K429 Umbilical hernia without obstruction or gangrene: Secondary | ICD-10-CM | POA: Insufficient documentation

## 2016-11-17 DIAGNOSIS — C5702 Malignant neoplasm of left fallopian tube: Secondary | ICD-10-CM | POA: Insufficient documentation

## 2016-11-17 DIAGNOSIS — E119 Type 2 diabetes mellitus without complications: Secondary | ICD-10-CM | POA: Insufficient documentation

## 2016-11-23 LAB — CYTOLOGY - PAP: Diagnosis: NEGATIVE

## 2016-11-25 ENCOUNTER — Telehealth: Payer: Self-pay

## 2016-11-25 NOTE — Telephone Encounter (Signed)
Pt informed of normal test result. Verbalized understanding. 

## 2017-04-27 ENCOUNTER — Ambulatory Visit: Payer: Self-pay | Attending: Gynecologic Oncology | Admitting: Gynecologic Oncology

## 2017-04-27 ENCOUNTER — Encounter: Payer: Self-pay | Admitting: Gynecologic Oncology

## 2017-04-27 VITALS — BP 136/64 | HR 75 | Temp 98.3°F | Resp 18 | Wt 166.2 lb

## 2017-04-27 DIAGNOSIS — C5702 Malignant neoplasm of left fallopian tube: Secondary | ICD-10-CM | POA: Insufficient documentation

## 2017-04-27 DIAGNOSIS — Z8544 Personal history of malignant neoplasm of other female genital organs: Secondary | ICD-10-CM

## 2017-04-27 DIAGNOSIS — Z8542 Personal history of malignant neoplasm of other parts of uterus: Secondary | ICD-10-CM

## 2017-04-27 DIAGNOSIS — C541 Malignant neoplasm of endometrium: Secondary | ICD-10-CM | POA: Insufficient documentation

## 2017-04-27 NOTE — Progress Notes (Signed)
GYN ONCOLOGY OFFICE VISIT   CC: Fallopian tube cancer Unstaged, Stage IA grade 1 endometrial cancer, surveillance  Assessment/Plan:  61 y.o. with a synchronous endometrial and  fallopian tube adenocarcinoma.  Stage IA grade 1 endometrial carcinoma   there is no lymphovascular space invasion there is less than 50% myometrial invasion.  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  This represents a very early fallopian tube cancer that was only detected on microscopic evaluation. As such omentectomy and periaortic nodes were not assessed at the time of surgery. Received 6 cycles taxol/carboplatin completed 01/2014.   Counseled on the signs and symptoms of recurrence Pap collected. Follow-up with Gyn Oncology  04/2017     HPI:  Makayla Buchanan was initially  seen at the request of Dr. Dellis Filbert a few months ago  for a newly diagnosed endometrial cancer.  She's been menopausal for essentially 5 years. She never took any hormone replacement therapy. Due to the persistent nature for spotting she went to urgent care and his was referred to Dr. Dellis Filbert.  Dr. Dellis Filbert performed an endometrial biopsy on February 13 2013. 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  She also had a CT scan of the abdomen and pelvis. IMPRESSION:  1. Abnormal appearance of the uterus in this patient with recently diagnosed endometrial carcinoma. No evidence of metastatic involvement of the abdomen or pelvis and no adenopathy is seen.  2. 4 mm anterolisthesis of L4 on L5 which appears to be due to degenerative change   RTH BSO LND was planned for 03/2013.  HgbA1C was elevated and instead a  Mirena IUD was placed and the  patinet was advised to secure better endocrinologic management of her IDDM.    On August 31, 2013 she underwent a robotic-assisted total laparoscopic hysterectomy bilateral salpingo-oophorectomy bilateral  pelvic lymph node dissection. Pathology was notable for an invasive endometrioid grade 1 carcinoma invading 9 mm where the myometrium is 19 mm. Tumor size was 1.8 cm. There was no lymphovascular space involvement and 0 of 10 lymph nodes were involved. On evaluation of the distal adnexa the left distal fimbriated end of the fallopian tubes showed a low grade adenocarcinoma with superficial stromal invasion present.  The morphologic features of the fimbrial tumor were completely different from the endometrial tumor. Microsatellite instability was not appreciated in the endometrial tumor.   She received  6 cycles Taxol/Carbo completed 01/2014.   CT 03/11/2014  Small fat containing paraumbilical hernia.   Pap 04/2015 wnl Interval hx: No interval change. Feels well. States her diabetes is better controlled but is unable to provide a hemoglobin A1c.  Good appetite reports weight gain   Review of Systems   Denies vaginal bleeding, no abdominal pain, no n/v, no visual changes. 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  Denies falls.  No vaginal or rectal bleeding.  No SOB no chest pain.  Reports weight gain.  Otherwise 10 point ROS negative  Past Surgical Hx:  Past Surgical History:  Procedure Laterality Date  . DILATION AND CURETTAGE OF UTERUS N/A 03/26/2013   Procedure: DILATATION AND CURETTAGE with insertion of Mirena IUD;  Surgeon: Janie Morning, MD;  [facility];  Service: Gynecology;  Laterality: N/A;  to start at 0745 per nancy. placement of Mirena.  Marland Kitchen LYMPH NODE DISSECTION N/A 09/17/2013   Procedure: LYMPH NODE DISECTION/STAGGING;  Surgeon: Janie Morning, MD;  [facility];  Service: Gynecology;  Laterality: N/A;  . ROBOTIC ASSISTED TOTAL HYSTERECTOMY WITH BILATERAL SALPINGO OOPHERECTOMY N/A 09/17/2013   Procedure: ROBOTIC ASSISTED TOTAL HYSTERECTOMY WITH BILATERAL SALPINGO OOPHORECTOMY;  Surgeon: Janie Morning, MD;  [facility];  Service: Gynecology;  Laterality: N/A;  .  UTERINE FIBROID SURGERY  20 YRS AGO    Past Medical Hx:  Past Medical History:  Diagnosis Date  . Cancer ([place])    endometrial  . Diabetes mellitus without complication ([place])   . Family history of breast cancer   . Hyperlipidemia   . Postmenopausal bleeding   Left Fallopian tube cancer dx 08/2013  Genetic testing included ATM BRCA1 and BRCA2 CHEK2 MLH1  MSH1 to [facility] 6 PTEN etc wnl Family Hx:  Family History   Problem  Relation  Age of Onset   .  Diabetes  Mother    .  Hypertension  Mother    .  Breast cancer  Mother    .  Diabetes  Father      Social Hx:  Visiting [place] for Christmas  Physical Exam:  Vitals BP 136/64 (BP Location: Left Arm, Patient Position: Sitting)   Pulse 75   Temp 98.3 F (36.8 C) (Oral)   Resp 18   Wt 166 lb 3.2 oz (75.4 kg)   BMI 29.44 kg/m   Wt Readings from Last 3 Encounters:  04/27/17 166 lb 3.2 oz (75.4 kg)  08/08/16 171 lb 3.2 oz (77.7 kg)  04/21/16 164 lb 6.4 oz (74.6 kg)   WD female in NAD CHEST:  CTA CARDIAC: RRR ABD:  Soft NT port sites without erythema or hernia, no ascites Pelvic:  Nl EGBUS, Cuff intact, no blood no cul de sac masses, two 60m subserosal cysts palpated in the anterior vagina, unchanged, non tender.  Not visible on speculum examination.  Rectal:  Good tone no masses Back:  No CVAT LN:  No cervical supraclavicular or inguinal adenopathy Ext:  No CCE

## 2017-04-27 NOTE — Patient Instructions (Addendum)
Plan to follow up with Dr. Skeet Latch in 6 months. Call the office if you have any questions or concerns prior to November.

## 2017-11-16 ENCOUNTER — Ambulatory Visit: Payer: Self-pay | Admitting: Gynecologic Oncology

## 2018-03-29 ENCOUNTER — Encounter: Payer: Self-pay | Admitting: Gynecologic Oncology

## 2018-03-29 ENCOUNTER — Inpatient Hospital Stay: Payer: BLUE CROSS/BLUE SHIELD | Attending: Gynecologic Oncology | Admitting: Gynecologic Oncology

## 2018-03-29 VITALS — BP 129/60 | HR 82 | Temp 98.0°F | Resp 20 | Ht 64.0 in | Wt 178.2 lb

## 2018-03-29 DIAGNOSIS — Z90722 Acquired absence of ovaries, bilateral: Secondary | ICD-10-CM

## 2018-03-29 DIAGNOSIS — Z90710 Acquired absence of both cervix and uterus: Secondary | ICD-10-CM

## 2018-03-29 DIAGNOSIS — C5702 Malignant neoplasm of left fallopian tube: Secondary | ICD-10-CM

## 2018-03-29 DIAGNOSIS — Z8543 Personal history of malignant neoplasm of ovary: Secondary | ICD-10-CM | POA: Insufficient documentation

## 2018-03-29 DIAGNOSIS — N952 Postmenopausal atrophic vaginitis: Secondary | ICD-10-CM | POA: Diagnosis not present

## 2018-03-29 DIAGNOSIS — Z8542 Personal history of malignant neoplasm of other parts of uterus: Secondary | ICD-10-CM | POA: Diagnosis not present

## 2018-03-29 DIAGNOSIS — C5700 Malignant neoplasm of unspecified fallopian tube: Secondary | ICD-10-CM | POA: Diagnosis not present

## 2018-03-29 DIAGNOSIS — C541 Malignant neoplasm of endometrium: Secondary | ICD-10-CM

## 2018-03-29 DIAGNOSIS — N941 Unspecified dyspareunia: Secondary | ICD-10-CM

## 2018-03-29 NOTE — Progress Notes (Signed)
GYN ONCOLOGY OFFICE VISIT   CC: Fallopian tube cancer Unstaged, Stage IA grade 1 endometrial cancer, surveillance  Assessment/Plan:  62 y.o. with a synchronous endometrial and  fallopian tube adenocarcinoma.  Stage IA grade 1 endometrial carcinoma   there is no lymphovascular space invasion there is less than 50% myometrial invasion.  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  This represents a very early fallopian tube cancer that was only detected on microscopic evaluation. As such omentectomy and periaortic nodes were not assessed at the time of surgery. Received 6 cycles taxol/carboplatin completed 01/2014.   Counseled on the signs and symptoms of recurrence Follow-up with Gyn Oncology October 2019  Vaginal atrophy Advised to apply coconut or all of oil a few times a week to the vagina and to consider use of a silicone based lubricant for sexual activity.   HPI:  Makayla Buchanan was initially  seen at the request of Dr. Dellis Filbert a few months ago  for a newly diagnosed endometrial cancer.  She's been menopausal for essentially 5 years. She never took any hormone replacement therapy. Due to the persistent nature for spotting she went to urgent care and his was referred to Dr. Dellis Filbert.  Dr. Dellis Filbert performed an endometrial biopsy on February 13 2013. 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  She also had a CT scan of the abdomen and pelvis. IMPRESSION:  1. Abnormal appearance of the uterus in this patient with recently diagnosed endometrial carcinoma. No evidence of metastatic involvement of the abdomen or pelvis and no adenopathy is seen.  2. 4 mm anterolisthesis of L4 on L5 which appears to be due to degenerative change   RTH BSO LND was planned for 03/2013.  HgbA1C was elevated and instead a  Mirena IUD was placed and the  patinet was advised to secure better endocrinologic management  of her IDDM.    On August 31, 2013 she underwent a robotic-assisted total laparoscopic hysterectomy bilateral salpingo-oophorectomy bilateral pelvic lymph node dissection. Pathology was notable for an invasive endometrioid grade 1 carcinoma invading 9 mm where the myometrium is 19 mm. Tumor size was 1.8 cm. There was no lymphovascular space involvement and 0 of 10 lymph nodes were involved. On evaluation of the distal adnexa the left distal fimbriated end of the fallopian tubes showed a low grade adenocarcinoma with superficial stromal invasion present.  The morphologic features of the fimbrial tumor were completely different from the endometrial tumor. Microsatellite instability was not appreciated in the endometrial tumor.   She received  6 cycles Taxol/Carbo completed 01/2014.   CT 03/11/2014  Small fat containing paraumbilical hernia.   Pap 04/2015 wnl Interval hx: No interval change. Feels well. States her diabetes is better controlled with the last  hemoglobin A1c 7.2.  Good appetite reports weight gain   Review of Systems   Denies vaginal bleeding, no abdominal pain, no n/v, no visual changes. 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  Denies falls.  No vaginal or rectal bleeding.  Reports vaginal dryness and dyspareunia no SOB no chest pain.  Reports weight gain.  Otherwise 10 point ROS negative  Past Surgical Hx:  Past Surgical History:  Procedure Laterality Date  . DILATION AND CURETTAGE OF UTERUS N/A 03/26/2013   Procedure: DILATATION AND CURETTAGE with insertion of Mirena IUD;  Surgeon: Janie Morning, MD;  [facility];  Service: Gynecology;  Laterality: N/A;  to start at  0745 per nancy. placement of Mirena.  Marland Kitchen LYMPH NODE DISSECTION N/A 09/17/2013   Procedure: LYMPH NODE DISECTION/STAGGING;  Surgeon: Janie Morning, MD;  [facility];  Service: Gynecology;  Laterality: N/A;  . ROBOTIC ASSISTED TOTAL HYSTERECTOMY WITH BILATERAL SALPINGO OOPHERECTOMY N/A 09/17/2013    Procedure: ROBOTIC ASSISTED TOTAL HYSTERECTOMY WITH BILATERAL SALPINGO OOPHORECTOMY;  Surgeon: Janie Morning, MD;  [facility];  Service: Gynecology;  Laterality: N/A;  . UTERINE FIBROID SURGERY  20 YRS AGO    Past Medical Hx:  Past Medical History:  Diagnosis Date  . Cancer ([place])    endometrial  . Diabetes mellitus without complication ([place])   . Family history of breast cancer   . Hyperlipidemia   . Postmenopausal bleeding   Left Fallopian tube cancer dx 08/2013  Genetic testing included ATM BRCA1 and BRCA2 CHEK2 MLH1  MSH1 to [facility] 6 PTEN etc wnl Family Hx:  Family History   Problem  Relation  Age of Onset   .  Diabetes  Mother    .  Hypertension  Mother    .  Breast cancer  Mother    .  Diabetes  Father      Social Hx: No changes Physical Exam:  Vitals BP 129/60 (BP Location: Left Arm, Patient Position: Sitting)   Pulse 82   Temp 98 F (36.7 C) (Oral)   Resp 20   Ht '5\' 4"'  (1.626 m)   Wt 178 lb 3.2 oz (80.8 kg)   SpO2 100%   BMI 30.59 kg/m   Wt Readings from Last 3 Encounters:  03/29/18 178 lb 3.2 oz (80.8 kg)  04/27/17 166 lb 3.2 oz (75.4 kg)  11/17/16 167 lb 8 oz (76 kg)   WD female in NAD CHEST:  CTA CARDIAC: RRR ABD:  Soft NT port sites without erythema or hernia, no ascites Pelvic:  Nl EGBUS, Cuff intact, no blood no cul de sac masses, two 89m subserosal cysts palpated in the anterior vagina, unchanged, non tender not visible on speculum examination.  Atrophic vagina Rectal:  Good tone no masses Back:  No CVAT LN:  No cervical supraclavicular or inguinal adenopathy Ext:  No CCE

## 2018-03-29 NOTE — Patient Instructions (Signed)
Plan on following up in six months or sooner if needed.  Please call in one to two weeks to schedule your appointment at [phone]. Please call for any questions or concerns.

## 2018-12-24 DIAGNOSIS — E1165 Type 2 diabetes mellitus with hyperglycemia: Secondary | ICD-10-CM | POA: Diagnosis not present

## 2018-12-24 DIAGNOSIS — E785 Hyperlipidemia, unspecified: Secondary | ICD-10-CM | POA: Diagnosis not present

## 2018-12-24 DIAGNOSIS — Z794 Long term (current) use of insulin: Secondary | ICD-10-CM | POA: Diagnosis not present

## 2018-12-24 DIAGNOSIS — E1149 Type 2 diabetes mellitus with other diabetic neurological complication: Secondary | ICD-10-CM | POA: Diagnosis not present

## 2018-12-24 DIAGNOSIS — G620 Drug-induced polyneuropathy: Secondary | ICD-10-CM | POA: Diagnosis not present

## 2018-12-24 DIAGNOSIS — T451X5A Adverse effect of antineoplastic and immunosuppressive drugs, initial encounter: Secondary | ICD-10-CM | POA: Diagnosis not present

## 2018-12-24 DIAGNOSIS — E1169 Type 2 diabetes mellitus with other specified complication: Secondary | ICD-10-CM | POA: Diagnosis not present

## 2019-05-08 DIAGNOSIS — E785 Hyperlipidemia, unspecified: Secondary | ICD-10-CM | POA: Diagnosis not present

## 2019-05-08 DIAGNOSIS — T451X5A Adverse effect of antineoplastic and immunosuppressive drugs, initial encounter: Secondary | ICD-10-CM | POA: Diagnosis not present

## 2019-05-08 DIAGNOSIS — Z780 Asymptomatic menopausal state: Secondary | ICD-10-CM | POA: Diagnosis not present

## 2019-05-08 DIAGNOSIS — E1165 Type 2 diabetes mellitus with hyperglycemia: Secondary | ICD-10-CM | POA: Diagnosis not present

## 2019-05-08 DIAGNOSIS — E1169 Type 2 diabetes mellitus with other specified complication: Secondary | ICD-10-CM | POA: Diagnosis not present

## 2019-05-08 DIAGNOSIS — Z1239 Encounter for other screening for malignant neoplasm of breast: Secondary | ICD-10-CM | POA: Diagnosis not present

## 2019-05-08 DIAGNOSIS — Z794 Long term (current) use of insulin: Secondary | ICD-10-CM | POA: Diagnosis not present

## 2019-05-08 DIAGNOSIS — G620 Drug-induced polyneuropathy: Secondary | ICD-10-CM | POA: Diagnosis not present

## 2019-05-15 ENCOUNTER — Other Ambulatory Visit: Payer: Self-pay | Admitting: *Deleted

## 2019-05-15 DIAGNOSIS — Z780 Asymptomatic menopausal state: Secondary | ICD-10-CM | POA: Diagnosis not present

## 2019-05-15 DIAGNOSIS — M85832 Other specified disorders of bone density and structure, left forearm: Secondary | ICD-10-CM | POA: Diagnosis not present

## 2019-05-15 DIAGNOSIS — Z1231 Encounter for screening mammogram for malignant neoplasm of breast: Secondary | ICD-10-CM | POA: Diagnosis not present

## 2019-05-22 DIAGNOSIS — E1142 Type 2 diabetes mellitus with diabetic polyneuropathy: Secondary | ICD-10-CM | POA: Diagnosis not present

## 2019-05-22 DIAGNOSIS — E1165 Type 2 diabetes mellitus with hyperglycemia: Secondary | ICD-10-CM | POA: Diagnosis not present

## 2019-05-22 DIAGNOSIS — Z794 Long term (current) use of insulin: Secondary | ICD-10-CM | POA: Diagnosis not present

## 2019-05-22 DIAGNOSIS — M85859 Other specified disorders of bone density and structure, unspecified thigh: Secondary | ICD-10-CM | POA: Diagnosis not present

## 2019-05-22 DIAGNOSIS — Z0000 Encounter for general adult medical examination without abnormal findings: Secondary | ICD-10-CM | POA: Diagnosis not present

## 2019-05-22 DIAGNOSIS — E1169 Type 2 diabetes mellitus with other specified complication: Secondary | ICD-10-CM | POA: Diagnosis not present

## 2019-05-22 DIAGNOSIS — E1149 Type 2 diabetes mellitus with other diabetic neurological complication: Secondary | ICD-10-CM | POA: Diagnosis not present

## 2019-05-22 DIAGNOSIS — G620 Drug-induced polyneuropathy: Secondary | ICD-10-CM | POA: Diagnosis not present

## 2019-05-22 DIAGNOSIS — Z1211 Encounter for screening for malignant neoplasm of colon: Secondary | ICD-10-CM | POA: Diagnosis not present

## 2019-05-22 DIAGNOSIS — E785 Hyperlipidemia, unspecified: Secondary | ICD-10-CM | POA: Diagnosis not present

## 2019-05-22 DIAGNOSIS — T451X5A Adverse effect of antineoplastic and immunosuppressive drugs, initial encounter: Secondary | ICD-10-CM | POA: Diagnosis not present

## 2019-05-23 ENCOUNTER — Ambulatory Visit: Payer: Self-pay | Admitting: *Deleted

## 2019-06-07 ENCOUNTER — Other Ambulatory Visit: Payer: Self-pay | Admitting: *Deleted

## 2019-06-07 NOTE — Patient Outreach (Signed)
[facility]) Care Management  06/07/2019  Makayla Buchanan 06-Dec-1956 [id number]   [facility] is closing this program. Consumer is enrolled in [facility] external program.  [facility] Care Management [phone]

## 2019-08-22 DIAGNOSIS — Z780 Asymptomatic menopausal state: Secondary | ICD-10-CM | POA: Diagnosis not present

## 2019-08-22 DIAGNOSIS — E1165 Type 2 diabetes mellitus with hyperglycemia: Secondary | ICD-10-CM | POA: Diagnosis not present

## 2019-08-22 DIAGNOSIS — M85859 Other specified disorders of bone density and structure, unspecified thigh: Secondary | ICD-10-CM | POA: Diagnosis not present

## 2019-08-22 DIAGNOSIS — E785 Hyperlipidemia, unspecified: Secondary | ICD-10-CM | POA: Diagnosis not present

## 2019-08-22 DIAGNOSIS — E1169 Type 2 diabetes mellitus with other specified complication: Secondary | ICD-10-CM | POA: Diagnosis not present

## 2019-08-22 DIAGNOSIS — E1149 Type 2 diabetes mellitus with other diabetic neurological complication: Secondary | ICD-10-CM | POA: Diagnosis not present

## 2019-08-22 DIAGNOSIS — Z794 Long term (current) use of insulin: Secondary | ICD-10-CM | POA: Diagnosis not present

## 2019-09-23 DIAGNOSIS — E113593 Type 2 diabetes mellitus with proliferative diabetic retinopathy without macular edema, bilateral: Secondary | ICD-10-CM | POA: Diagnosis not present

## 2019-11-22 DIAGNOSIS — U071 COVID-19: Secondary | ICD-10-CM | POA: Diagnosis not present

## 2019-11-22 DIAGNOSIS — J0100 Acute maxillary sinusitis, unspecified: Secondary | ICD-10-CM | POA: Diagnosis not present

## 2019-11-26 DIAGNOSIS — E119 Type 2 diabetes mellitus without complications: Secondary | ICD-10-CM | POA: Diagnosis not present

## 2022-08-05 LAB — EXTERNAL GENERIC LAB PROCEDURE: COLOGUARD: NEGATIVE

## 2023-01-02 ENCOUNTER — Encounter: Payer: Self-pay | Admitting: Ophthalmology

## 2023-01-12 ENCOUNTER — Ambulatory Visit: Payer: Medicare HMO | Admitting: Anesthesiology

## 2023-01-12 ENCOUNTER — Encounter: Payer: Self-pay | Admitting: Ophthalmology

## 2023-01-12 ENCOUNTER — Other Ambulatory Visit: Payer: Self-pay

## 2023-01-12 ENCOUNTER — Ambulatory Visit
Admission: RE | Admit: 2023-01-12 | Discharge: 2023-01-12 | Disposition: A | Discharge status: Home / Self Care | Payer: Medicare HMO | Attending: Ophthalmology | Admitting: Ophthalmology

## 2023-01-12 ENCOUNTER — Encounter
Admission: RE | Disposition: A | Discharge status: Home / Self Care | Payer: Self-pay | Source: Home / Self Care | Attending: Ophthalmology

## 2023-01-12 DIAGNOSIS — Z794 Long term (current) use of insulin: Secondary | ICD-10-CM | POA: Insufficient documentation

## 2023-01-12 DIAGNOSIS — E785 Hyperlipidemia, unspecified: Secondary | ICD-10-CM | POA: Insufficient documentation

## 2023-01-12 DIAGNOSIS — H2511 Age-related nuclear cataract, right eye: Secondary | ICD-10-CM | POA: Insufficient documentation

## 2023-01-12 DIAGNOSIS — E1136 Type 2 diabetes mellitus with diabetic cataract: Secondary | ICD-10-CM | POA: Insufficient documentation

## 2023-01-12 DIAGNOSIS — Z79899 Other long term (current) drug therapy: Secondary | ICD-10-CM | POA: Diagnosis not present

## 2023-01-12 DIAGNOSIS — Z7985 Long-term (current) use of injectable non-insulin antidiabetic drugs: Secondary | ICD-10-CM | POA: Diagnosis not present

## 2023-01-12 HISTORY — PX: CATARACT EXTRACTION W/PHACO: SHX586

## 2023-01-12 LAB — GLUCOSE, CAPILLARY: Glucose-Capillary: 122 mg/dL — ABNORMAL HIGH (ref 70–99)

## 2023-01-12 SURGERY — PHACOEMULSIFICATION, CATARACT, WITH IOL INSERTION
Anesthesia: Monitor Anesthesia Care | Site: Eye | Laterality: Right

## 2023-01-12 MED ORDER — SIGHTPATH DOSE#1 BSS IO SOLN
INTRAOCULAR | Status: DC | PRN
  Administered 2023-01-12: 96 mL via OPHTHALMIC

## 2023-01-12 MED ORDER — TETRACAINE HCL 0.5 % OP SOLN
1.0000 [drp] | OPHTHALMIC | Status: DC | PRN
  Administered 2023-01-12 (×3): 1 [drp] via OPHTHALMIC

## 2023-01-12 MED ORDER — LIDOCAINE HCL (PF) 2 % IJ SOLN
INTRAOCULAR | Status: DC | PRN
  Administered 2023-01-12: 1 mL via INTRAOCULAR

## 2023-01-12 MED ORDER — BRIMONIDINE TARTRATE-TIMOLOL 0.2-0.5 % OP SOLN
OPHTHALMIC | Status: DC | PRN
  Administered 2023-01-12: 1 [drp] via OPHTHALMIC

## 2023-01-12 MED ORDER — FENTANYL CITRATE (PF) 100 MCG/2ML IJ SOLN
INTRAMUSCULAR | Status: DC | PRN
  Administered 2023-01-12: 25 ug via INTRAVENOUS
  Administered 2023-01-12: 50 ug via INTRAVENOUS

## 2023-01-12 MED ORDER — LACTATED RINGERS IV SOLN: INTRAVENOUS | Status: DC

## 2023-01-12 MED ORDER — SIGHTPATH DOSE#1 NA HYALUR & NA CHOND-NA HYALUR IO KIT
PACK | INTRAOCULAR | Status: DC | PRN
  Administered 2023-01-12: 1 via OPHTHALMIC

## 2023-01-12 MED ORDER — ONDANSETRON HCL 4 MG/2ML IJ SOLN: 4.0000 mg | Freq: Once | INTRAMUSCULAR | Status: DC | PRN

## 2023-01-12 MED ORDER — SIGHTPATH DOSE#1 BSS IO SOLN
INTRAOCULAR | Status: DC | PRN
  Administered 2023-01-12: 15 mL

## 2023-01-12 MED ORDER — MOXIFLOXACIN HCL 0.5 % OP SOLN
OPHTHALMIC | Status: DC | PRN
  Administered 2023-01-12: .2 mL via OPHTHALMIC

## 2023-01-12 MED ORDER — ARMC OPHTHALMIC DILATING DROPS
1.0000 | OPHTHALMIC | Status: DC | PRN
  Administered 2023-01-12 (×3): 1 via OPHTHALMIC

## 2023-01-12 MED ORDER — MIDAZOLAM HCL 2 MG/2ML IJ SOLN
INTRAMUSCULAR | Status: DC | PRN
  Administered 2023-01-12: .5 mg via INTRAVENOUS
  Administered 2023-01-12: 1 mg via INTRAVENOUS

## 2023-01-12 SURGICAL SUPPLY — 12 items
CATARACT SUITE SIGHTPATH (MISCELLANEOUS) ×1 IMPLANT
DISSECTOR HYDRO NUCLEUS 50X22 (MISCELLANEOUS) ×2 IMPLANT
DRSG TEGADERM 2-3/8X2-3/4 SM (GAUZE/BANDAGES/DRESSINGS) ×2 IMPLANT
FEE CATARACT SUITE SIGHTPATH (MISCELLANEOUS) ×2 IMPLANT
GLOVE SURG SYN 7.5  E (GLOVE) ×1
GLOVE SURG SYN 7.5 E (GLOVE) ×1 IMPLANT
GLOVE SURG SYN 7.5 PF PI (GLOVE) ×2 IMPLANT
GLOVE SURG SYN 8.5  E (GLOVE) ×1
GLOVE SURG SYN 8.5 E (GLOVE) ×1 IMPLANT
GLOVE SURG SYN 8.5 PF PI (GLOVE) ×2 IMPLANT
LENS IOL TECNIS EYHANCE 19.5 (Intraocular Lens) IMPLANT
WATER STERILE IRR 250ML POUR (IV SOLUTION) ×2 IMPLANT

## 2023-01-12 NOTE — Anesthesia Postprocedure Evaluation (Signed)
Anesthesia Post Note  Patient: Dezerae Freiberger  Procedure(s) Performed: CATARACT EXTRACTION PHACO AND INTRAOCULAR LENS PLACEMENT (IOC) RIGHT DIABETIC  11.66  01:15.3 (Right: Eye)  Patient location during evaluation: PACU Anesthesia Type: MAC Level of consciousness: awake and alert Pain management: pain level controlled Vital Signs Assessment: post-procedure vital signs reviewed and stable Respiratory status: spontaneous breathing, nonlabored ventilation, respiratory function stable and patient connected to nasal cannula oxygen Cardiovascular status: stable and blood pressure returned to baseline Postop Assessment: no apparent nausea or vomiting Anesthetic complications: no   No notable events documented.   Last Vitals:  Vitals:   01/12/23 1218 01/12/23 1219  BP: (!) 71/30 (!) 122/56  Pulse: 66 69  Resp: 12 18  Temp:    SpO2: 97% 98%    Last Pain:  Vitals:   01/12/23 1218  TempSrc:   PainSc: 0-No pain                 Arita Miss

## 2023-01-12 NOTE — H&P (Signed)
[facility]   Primary Care Physician:  Bernerd Limbo, MD Ophthalmologist: Dr. Merleen Nicely  Pre-Procedure History & Physical: HPI:  Makayla Buchanan is a 67 y.o. female here for cataract surgery.   Past Medical History:  Diagnosis Date   Cancer [facility])    endometrial   Diabetes mellitus without complication ([place])    Family history of breast cancer    Hyperlipidemia    Postmenopausal bleeding     Past Surgical History:  Procedure Laterality Date   DILATION AND CURETTAGE OF UTERUS N/A 03/26/2013   Procedure: DILATATION AND CURETTAGE with insertion of Mirena IUD;  Surgeon: Janie Morning, MD;  [facility];  Service: Gynecology;  Laterality: N/A;  to start at 0745 per nancy. placement of Mirena.   LYMPH NODE DISSECTION N/A 09/17/2013   Procedure: LYMPH NODE DISECTION/STAGGING;  Surgeon: Janie Morning, MD;  [facility];  Service: Gynecology;  Laterality: N/A;   ROBOTIC ASSISTED TOTAL HYSTERECTOMY WITH BILATERAL SALPINGO OOPHERECTOMY N/A 09/17/2013   Procedure: ROBOTIC ASSISTED TOTAL HYSTERECTOMY WITH BILATERAL SALPINGO OOPHORECTOMY;  Surgeon: Janie Morning, MD;  [facility];  Service: Gynecology;  Laterality: N/A;   UTERINE FIBROID SURGERY  20 YRS AGO    Prior to Admission medications   Medication Sig Start Date End Date Taking? Authorizing Provider  atorvastatin (LIPITOR) 20 MG tablet Take 20 mg by mouth daily.   Yes [provider]  cholecalciferol (VITAMIN D3) 25 MCG (1000 UNIT) tablet Take 1,000 Units by mouth daily.   Yes [provider]  glucose blood (PRECISION QID TEST) test strip by Other route Two (2) times a day. 03/18/16  Yes [provider]  Insulin Glargine (TOUJEO SOLOSTAR Teresita) Inject 100 Units into the skin daily.   Yes [provider]  Insulin Pen Needle (FIFTY50 PEN NEEDLES) 31G X 5 MM MISC by Does not apply route. 03/18/16  Yes [provider]  magnesium oxide (MAG-OX) 400 (240 Mg) MG tablet Take 400 mg by  mouth daily.   Yes [provider]  sennosides-docusate sodium (SENOKOT-S) 8.6-50 MG tablet Take 1-2 tablets by mouth daily as needed for constipation.   Yes [provider]  TRULICITY 0.25 [place]/7.0WC SOPN  03/05/18   [provider]    Allergies as of 11/24/2022 - Review Complete 03/29/2018  Allergen Reaction Noted   Metformin Diarrhea 03/04/2016    Family History  Problem Relation Age of Onset   Diabetes Mother    Hypertension Mother    Breast cancer Mother        dx in her 29s   Diabetes Father    Cancer Maternal Aunt        NOS    Social History   Socioeconomic History   Marital status: Married    Spouse name: Not on file   Number of children: 1   Years of education: Not on file   Highest education level: Not on file  Occupational History   Occupation: [employer]     Employer: [employer]  Tobacco Use   Smoking status: Never   Smokeless tobacco: Never  Substance and Sexual Activity   Alcohol use: No   Drug use: No   Sexual activity: Yes  Other Topics Concern   Not on file  Social History Narrative   Not on file   Social Determinants of Health   Financial Resource Strain: Not on file  Food Insecurity: Not on file  Transportation Needs: Not on file  Physical Activity: Not on file  Stress: Not on file  Social Connections: Not on file  Intimate Partner Violence: Not on file    Review of Systems: See HPI, otherwise negative ROS  Physical Exam: BP (!) 133/55   Pulse 80   Temp (!) 97.2 F (36.2 C) (Temporal)   Resp (!) 22   Ht '5\' 4"'$  (1.626 m)   Wt 86 kg   SpO2 (!) 80%   BMI 32.54 kg/m  General:   Alert, cooperative in NAD Head:  Normocephalic and atraumatic. Respiratory:  Normal work of breathing. Cardiovascular:  RRR  Impression/Plan: Makayla Buchanan is here for cataract surgery.  Risks, benefits, limitations, and alternatives regarding cataract surgery have been reviewed with the patient.  Questions have been  answered.  All parties agreeable.   Norvel Richards, MD  01/12/2023, 11:26 AM

## 2023-01-12 NOTE — Transfer of Care (Signed)
Immediate Anesthesia Transfer of Care Note  Patient: Makayla Buchanan  Procedure(s) Performed: CATARACT EXTRACTION PHACO AND INTRAOCULAR LENS PLACEMENT (IOC) RIGHT DIABETIC  11.66  01:15.3 (Right: Eye)  Patient Location: PACU  Anesthesia Type: MAC  Level of Consciousness: awake, alert  and patient cooperative  Airway and Oxygen Therapy: Patient Spontanous Breathing and Patient connected to supplemental oxygen  Post-op Assessment: Post-op Vital signs reviewed, Patient's Cardiovascular Status Stable, Respiratory Function Stable, Patent Airway and No signs of Nausea or vomiting  Post-op Vital Signs: Reviewed and stable  Complications: No notable events documented.

## 2023-01-12 NOTE — Discharge Instructions (Signed)
   Cataract Surgery, Care After ? ?This sheet gives you information about how to care for yourself after your surgery.  Your ophthalmologist may also give you more specific instructions.  If you have problems or questions, contact your doctor at [facility], [phone]. ? ?What can I expect after the surgery? ?It is common to have: ?Itching ?Foreign body sensation (feels like a grain of sand in the eye) ?Watery discharge (excess tearing) ?Sensitivity to light and touch ?Bruising in or around the eye ?Mild blurred vision ? ?Follow these instructions at home: ?Do not touch or rub your eyes. ?You may be told to wear a protective shield or sunglasses to protect your eyes. ?Do not put a contact lens in the operative eye unless your doctor approves. ?Keep the lids and face clean and dry. ?Do not allow water to hit you directly in the face while showering. ?Keep soap and shampoo out of your eyes. ?Do not use eye makeup for 1 week. ? ?Check your eye every day for signs of infection.  Watch for: ?Redness, swelling, or pain. ?Fluid, blood or pus. ?Worsening vision. ?Worsening sensitivity to light or touch. ? ?Activity: ?During the first day, avoid bending over and reading.  You may resume reading and bending the next day. ?Do not drive or use heavy machinery for at least 24 hours. ?Avoid strenuous activities for 1 week.  Activities such as walking, treadmill, exercise bike, and climbing stairs are okay. ?Do not lift heavy (>20 pound) objects for 1 week. ?Do not do yardwork, gardening, or dirty housework (mopping, cleaning bathrooms, vacuuming, etc.) for 1 week. ?Do not swim or use a hot tub for 2 weeks. ?Ask your doctor when you can return to work. ? ?General Instructions: ?Take or apply prescription and over-the-counter medicines as directed by your doctor, including eyedrops and ointments. ?Resume medications discontinued prior to surgery, unless told otherwise by your doctor. ?Keep all follow up appointments as  scheduled. ? ?Contact a health care provider if: ?You have increased bruising around your eye. ?You have pain that is not helped with medication. ?You have a fever. ?You have fluid, pus, or blood coming from your eye or incision. ?Your sensitivity to light gets worse. ?You have spots (floaters) of flashing lights in your vision. ?You have nausea or vomiting. ? ?Go to the nearest emergency room or call 911 if: ?You have sudden loss of vision. ?You have severe, worsening eye pain. ? ?

## 2023-01-12 NOTE — Op Note (Signed)
OPERATIVE NOTE  Makayla Buchanan [id number] 01/12/2023   PREOPERATIVE DIAGNOSIS: Nuclear sclerotic cataract right eye. 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 H25.11   PROCEDURE:  Phacoemusification with posterior chamber intraocular lens placement of the right eye  Ultrasound time: Procedure(s): CATARACT EXTRACTION PHACO AND INTRAOCULAR LENS PLACEMENT (IOC) RIGHT DIABETIC  11.66  01:15.3 (Right)  LENS:   Implant Name Type Inv. Item Serial No. Manufacturer Lot No. LRB No. Used Action  LENS IOL TECNIS EYHANCE 19.5 - [id number] Intraocular Lens LENS IOL TECNIS EYHANCE 19.5 [id number] [employer]  Right 1 Implanted      SURGEON:  Courtney Heys. Lazarus Salines, MD   ANESTHESIA:  Topical with tetracaine drops, augmented with 1% preservative-free intracameral lidocaine.   COMPLICATIONS:  None.   DESCRIPTION OF PROCEDURE:  The patient was identified in the holding room and transported to the operating room and placed in the supine position under the operating microscope.  The right eye was identified as the operative eye, which was prepped and draped in the usual sterile ophthalmic fashion.   A 1 millimeter clear-corneal paracentesis was made superotemporally. Preservative-free 1% lidocaine mixed with 1:1,000 bisulfite-free aqueous solution of epinephrine was injected into the anterior chamber. The anterior chamber was then filled with Viscoat viscoelastic. A 2.4 millimeter keratome was used to make a clear-corneal incision inferotemporally. A curvilinear capsulorrhexis was made with a cystotome and capsulorrhexis forceps. Balanced salt solution was used to hydrodissect and hydrodelineate the nucleus. Phacoemulsification was then used to remove the lens nucleus and epinucleus. The remaining cortex was then removed using the irrigation and aspiration handpiece. Provisc was then placed into the capsular bag to distend it for lens placement. A +19.50 D DIB00 intraocular lens was then  injected into the capsular bag. The remaining viscoelastic was aspirated.   Wounds were hydrated with balanced salt solution.  The anterior chamber was inflated to a physiologic pressure with balanced salt solution.  No wound leaks were noted. Vigamox was injected intracamerally.  Timolol and Brimonidine drops were applied to the eye.  The patient was taken to the recovery room in stable condition without complications of anesthesia or surgery.  Makayla Buchanan [place] 01/12/2023, 12:13 PM

## 2023-01-12 NOTE — Anesthesia Preprocedure Evaluation (Signed)
Anesthesia Evaluation  Patient identified by MRN, date of birth, ID band Patient awake    Reviewed: Allergy & Precautions, NPO status , Patient's Chart, lab work & pertinent test results  History of Anesthesia Complications Negative for: history of anesthetic complications  Airway Mallampati: III  TM Distance: >3 FB Neck ROM: Full    Dental no notable dental hx. (+) Teeth Intact   Pulmonary neg pulmonary ROS, neg sleep apnea, neg COPD, Patient abstained from smoking.Not current smoker   Pulmonary exam normal breath sounds clear to auscultation       Cardiovascular Exercise Tolerance: Good METS(-) hypertension(-) CAD and (-) Past MI negative cardio ROS (-) dysrhythmias  Rhythm:Regular Rate:Normal - Systolic murmurs    Neuro/Psych  Neuromuscular disease  negative psych ROS   GI/Hepatic ,neg GERD  ,,(+)     (-) substance abuse    Endo/Other  diabetes, Type 2, Insulin Dependent    Renal/GU negative Renal ROS     Musculoskeletal   Abdominal  (+) + obese  Peds  Hematology   Anesthesia Other Findings Past Medical History: No date: Cancer ([place])     Comment:  endometrial No date: Diabetes mellitus without complication ([place]) No date: Family history of breast cancer No date: Hyperlipidemia No date: Postmenopausal bleeding  Reproductive/Obstetrics                             Anesthesia Physical Anesthesia Plan  ASA: 3  Anesthesia Plan: MAC   Post-op Pain Management: Minimal or no pain anticipated   Induction: Intravenous  PONV Risk Score and Plan: 2 and Midazolam  Airway Management Planned: Nasal Cannula  Additional Equipment:   Intra-op Plan:   Post-operative Plan:   Informed Consent: I have reviewed the patients History and Physical, chart, labs and discussed the procedure including the risks, benefits and alternatives for the proposed anesthesia with the patient or authorized  representative who has indicated his/her understanding and acceptance.       Plan Discussed with: CRNA and Surgeon  Anesthesia Plan Comments: (Explained risks of anesthesia, including PONV, and rare emergencies such as cardiac events, respiratory problems, and allergic reactions, requiring invasive intervention. Discussed the role of CRNA in patient's perioperative care. Patient understands. )       Anesthesia Quick Evaluation

## 2023-01-13 ENCOUNTER — Encounter: Payer: Self-pay | Admitting: Ophthalmology

## 2023-01-26 ENCOUNTER — Ambulatory Visit: Admit: 2023-01-26 | Payer: PPO | Admitting: Ophthalmology

## 2023-01-26 SURGERY — PHACOEMULSIFICATION, CATARACT, WITH IOL INSERTION
Anesthesia: Topical | Laterality: Left
